# Patient Record
Sex: Female | Born: 1975 | Race: White | Hispanic: No | Marital: Married | Smoking: Never smoker
Health system: Southern US, Community
[De-identification: ages and names within clinical notes are randomized; demographics above are authoritative.]

## PROBLEM LIST (undated history)

## (undated) DIAGNOSIS — K219 Gastro-esophageal reflux disease without esophagitis: Secondary | ICD-10-CM

## (undated) DIAGNOSIS — R55 Syncope and collapse: Secondary | ICD-10-CM

## (undated) DIAGNOSIS — Q453 Other congenital malformations of pancreas and pancreatic duct: Secondary | ICD-10-CM

## (undated) DIAGNOSIS — D649 Anemia, unspecified: Secondary | ICD-10-CM

## (undated) DIAGNOSIS — I10 Essential (primary) hypertension: Secondary | ICD-10-CM

## (undated) DIAGNOSIS — R519 Headache, unspecified: Secondary | ICD-10-CM

## (undated) DIAGNOSIS — I471 Supraventricular tachycardia, unspecified: Secondary | ICD-10-CM

## (undated) DIAGNOSIS — N809 Endometriosis, unspecified: Secondary | ICD-10-CM

## (undated) DIAGNOSIS — A770 Spotted fever due to Rickettsia rickettsii: Secondary | ICD-10-CM

## (undated) DIAGNOSIS — F419 Anxiety disorder, unspecified: Secondary | ICD-10-CM

## (undated) DIAGNOSIS — N83209 Unspecified ovarian cyst, unspecified side: Secondary | ICD-10-CM

## (undated) DIAGNOSIS — M797 Fibromyalgia: Secondary | ICD-10-CM

## (undated) DIAGNOSIS — G4730 Sleep apnea, unspecified: Secondary | ICD-10-CM

## (undated) DIAGNOSIS — R51 Headache: Secondary | ICD-10-CM

## (undated) DIAGNOSIS — M069 Rheumatoid arthritis, unspecified: Secondary | ICD-10-CM

## (undated) HISTORY — DX: Headache: R51

## (undated) HISTORY — DX: Headache, unspecified: R51.9

## (undated) HISTORY — PX: LAPAROSCOPIC GASTRIC SLEEVE RESECTION: SHX5895

## (undated) HISTORY — DX: Other congenital malformations of pancreas and pancreatic duct: Q45.3

## (undated) HISTORY — DX: Essential (primary) hypertension: I10

## (undated) HISTORY — DX: Unspecified ovarian cyst, unspecified side: N83.209

## (undated) HISTORY — DX: Endometriosis, unspecified: N80.9

## (undated) HISTORY — DX: Supraventricular tachycardia, unspecified: I47.10

## (undated) HISTORY — PX: MOUTH SURGERY: SHX715

## (undated) HISTORY — DX: Supraventricular tachycardia: I47.1

## (undated) HISTORY — DX: Spotted fever due to Rickettsia rickettsii: A77.0

## (undated) HISTORY — PX: ENDOMETRIAL ABLATION: SHX621

## (undated) HISTORY — DX: Syncope and collapse: R55

## (undated) HISTORY — DX: Rheumatoid arthritis, unspecified: M06.9

---
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
## 1999-03-14 HISTORY — PX: TUBAL LIGATION: SHX77

## 2001-03-13 HISTORY — PX: REDUCTION MAMMAPLASTY: SUR839

## 2007-04-08 ENCOUNTER — Emergency Department (HOSPITAL_COMMUNITY): Admission: EM | Admit: 2007-04-08 | Discharge: 2007-04-08 | Payer: Self-pay | Admitting: Emergency Medicine

## 2010-12-25 LAB — HM PAP SMEAR: HM Pap smear: NORMAL

## 2011-01-25 ENCOUNTER — Other Ambulatory Visit: Payer: Self-pay | Admitting: *Deleted

## 2011-01-25 DIAGNOSIS — N63 Unspecified lump in unspecified breast: Secondary | ICD-10-CM

## 2011-02-08 ENCOUNTER — Ambulatory Visit
Admission: RE | Admit: 2011-02-08 | Discharge: 2011-02-08 | Disposition: A | Discharge status: Home / Self Care | Payer: 59 | Source: Ambulatory Visit | Attending: *Deleted | Admitting: *Deleted

## 2011-02-08 DIAGNOSIS — N63 Unspecified lump in unspecified breast: Secondary | ICD-10-CM

## 2011-02-16 ENCOUNTER — Other Ambulatory Visit: Payer: Self-pay | Admitting: *Deleted

## 2011-02-16 DIAGNOSIS — N63 Unspecified lump in unspecified breast: Secondary | ICD-10-CM

## 2011-07-31 ENCOUNTER — Ambulatory Visit
Admission: RE | Admit: 2011-07-31 | Discharge: 2011-07-31 | Disposition: A | Discharge status: Home / Self Care | Payer: 59 | Source: Ambulatory Visit | Attending: *Deleted | Admitting: *Deleted

## 2011-07-31 ENCOUNTER — Other Ambulatory Visit: Payer: Self-pay | Admitting: *Deleted

## 2011-07-31 DIAGNOSIS — N63 Unspecified lump in unspecified breast: Secondary | ICD-10-CM

## 2013-06-09 ENCOUNTER — Ambulatory Visit: Payer: Self-pay | Admitting: General Practice

## 2013-07-31 ENCOUNTER — Other Ambulatory Visit (HOSPITAL_COMMUNITY): Payer: Self-pay | Admitting: Psychiatry

## 2013-07-31 DIAGNOSIS — G43909 Migraine, unspecified, not intractable, without status migrainosus: Secondary | ICD-10-CM

## 2013-07-31 DIAGNOSIS — R413 Other amnesia: Secondary | ICD-10-CM

## 2013-07-31 DIAGNOSIS — R55 Syncope and collapse: Secondary | ICD-10-CM

## 2013-08-12 ENCOUNTER — Ambulatory Visit (INDEPENDENT_AMBULATORY_CARE_PROVIDER_SITE_OTHER): Payer: 59 | Admitting: Cardiovascular Disease

## 2013-08-12 ENCOUNTER — Encounter: Payer: Self-pay | Admitting: Cardiovascular Disease

## 2013-08-12 ENCOUNTER — Ambulatory Visit (HOSPITAL_COMMUNITY)
Admission: RE | Admit: 2013-08-12 | Discharge: 2013-08-12 | Disposition: A | Discharge status: Home / Self Care | Payer: 59 | Source: Ambulatory Visit | Attending: Psychiatry | Admitting: Psychiatry

## 2013-08-12 VITALS — BP 124/88 | HR 81 | Ht 63.0 in | Wt 204.5 lb

## 2013-08-12 DIAGNOSIS — G43909 Migraine, unspecified, not intractable, without status migrainosus: Secondary | ICD-10-CM

## 2013-08-12 DIAGNOSIS — R55 Syncope and collapse: Secondary | ICD-10-CM

## 2013-08-12 DIAGNOSIS — R413 Other amnesia: Secondary | ICD-10-CM

## 2013-08-12 DIAGNOSIS — E669 Obesity, unspecified: Secondary | ICD-10-CM | POA: Insufficient documentation

## 2013-08-12 DIAGNOSIS — I10 Essential (primary) hypertension: Secondary | ICD-10-CM

## 2013-08-12 DIAGNOSIS — R000 Tachycardia, unspecified: Secondary | ICD-10-CM | POA: Insufficient documentation

## 2013-08-12 NOTE — Progress Notes (Signed)
HPI  This is a pleasant 38 year old female who is here for evaluation of tachycardia. 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 She reports having a syncopal episode in 2012 which was followed by vomiting. She was evaluated by neurology with negative workup. She then saw cardiologist in [place] (Dr. Alroy Dust). She was told about tachycardia related to prednisone use (for rheumatoid arthritis). She was treated with metoprolol with improvement in symptoms. Thyroid function was unremarkable. 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  No chest pain or shortness of breath. Family history is remarkable for coronary artery disease. Her mother had coronary artery disease in her early 42s. The patient is considering weight loss surgery.   Allergies  Allergen Reactions  . Amoxicillin   . Enbrel [Etanercept]      No current outpatient prescriptions on file prior to visit.   No current facility-administered medications on file prior to visit.     Past Medical History  Diagnosis Date  . Syncope and collapse   . Heart murmur   . SVT (supraventricular tachycardia)   . Hypertension      Past Surgical History  Procedure Laterality Date  . Appendectomy    . Breast reduction surgery    . Tubal ligation       Family History  Problem Relation Age of Onset  . Heart disease Mother   . Heart attack Mother   . Stroke Mother   . Hypertension Mother   . Hyperlipidemia Mother   . Heart disease Father   . Heart attack Father   . Hypertension Father   . Hyperlipidemia Father      History   Social History  . Marital Status: Married    Spouse Name: N/A    Number of Children: N/A  . Years of Education: N/A   Occupational History  . Not on file.   Social History Main Topics  . Smoking status: Never Smoker   . Smokeless tobacco: Not on file  . Alcohol Use: No  . Drug Use: No  . Sexual Activity: Not  on file   Other Topics Concern  . Not on file   Social History Narrative  . No narrative on file     ROS A 10 point review of system was performed. It is negative other than that mentioned in the history of present illness.   PHYSICAL EXAM   BP 124/88  Pulse 81  Ht 5\' 3"  (1.6 m)  Wt 204 lb 8 oz (92.761 kg)  BMI 36.23 kg/m2 Constitutional: She is oriented to person, place, and time. She appears well-developed and well-nourished. No distress.  HENT: No nasal discharge.  Head: Normocephalic and atraumatic.  Eyes: Pupils are equal and round. No discharge.  Neck: Normal range of motion. Neck supple. No JVD present. No thyromegaly present.  Cardiovascular: Normal rate, regular rhythm, normal heart sounds. Exam reveals no gallop and no friction rub. No murmur heard.  Pulmonary/Chest: Effort normal and breath sounds normal. No stridor. No respiratory distress. She has no wheezes. She has no rales. She exhibits no tenderness.  Abdominal: Soft. Bowel sounds are normal. She exhibits no distension. There is no tenderness. There is no rebound and no guarding.  Musculoskeletal: Normal range of motion. She exhibits no edema and no tenderness.  Neurological: She is alert and oriented to person, place, and time. Coordination normal.  Skin: Skin is warm and dry. No rash noted. She is  not diaphoretic. No erythema. No pallor.  Psychiatric: She has a normal mood and affect. Her behavior is normal. Judgment and thought content normal.     JQB:HALPFX sinus rhythm with a PVC. No significant ST or T wave changes.   ASSESSMENT AND PLAN

## 2013-08-12 NOTE — Patient Instructions (Signed)
Continue same medications.   Your physician wants you to follow-up in: 6 months.  You will receive a reminder letter in the mail two months in advance. If you don't receive a letter, please call our office to schedule the follow-up appointment.  

## 2013-08-12 NOTE — Assessment & Plan Note (Signed)
Blood pressure is controlled on metoprolol and hydrochlorothiazide.

## 2013-08-12 NOTE — Assessment & Plan Note (Signed)
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 I requested a cardiac records from previous cardiologist before proceeding with any further testing. Symptoms seem to be reasonably controlled with metoprolol.

## 2013-08-12 NOTE — Assessment & Plan Note (Signed)
I don't see a contraindication for weight loss surgery from a cardiac standpoint.

## 2013-08-14 ENCOUNTER — Ambulatory Visit (INDEPENDENT_AMBULATORY_CARE_PROVIDER_SITE_OTHER): Payer: 59 | Admitting: Adult Health

## 2013-08-14 ENCOUNTER — Encounter: Payer: Self-pay | Admitting: Adult Health

## 2013-08-14 VITALS — BP 110/70 | HR 73 | Resp 16 | Ht 64.0 in | Wt 205.8 lb

## 2013-08-14 DIAGNOSIS — E669 Obesity, unspecified: Secondary | ICD-10-CM

## 2013-08-14 NOTE — Progress Notes (Signed)
Pre visit review using our clinic review tool, if applicable. No additional management support is needed unless otherwise documented below in the visit note. 

## 2013-08-14 NOTE — Patient Instructions (Signed)
   Thank you for choosing [facility] for your health care needs.  Please schedule your complete physical exam at your convenience. You will need to be fasting.

## 2013-08-14 NOTE — Progress Notes (Signed)
Subjective:    Patient ID: JOSEPH JOHNS, female    DOB: 10-23-1975, 38 y.o.   MRN: [id number]  HPI Reports feeling well. Considering bariatric surgery, consulting with bariatric surgery for potential gastric sleeve. Having a hard time managing weight and wants to reduce risk of cardiovascular disease with family history as well as other illnesses. Has lost weight in the past. Has tired [place] in the past. Currently exercises using the fitbit and walking. Concerned with daughter becoming healthy and setting a good example.    Past Medical History  Diagnosis Date  . Syncope and collapse 3 years ago - Happened about 2 times - Last time     Seen by neuro and cardiology - increased heart rate related  . Heart murmur     Seen by Dr. Fletcher Anon - no murmur  . SVT (supraventricular tachycardia) Noticed 3 years ago    Followed by Dr. Fletcher Anon  . Hypertension 3 years ago  . Frequent headaches     Followed by Neurology - in [place] - stress  . Rheumatoid arthritis From [facility]    Remission 2 years  . Rocky Mountain spotted fever 5 years ago     Past Surgical History  Procedure Laterality Date  . Appendectomy  1984  . Breast reduction surgery  2003  . Tubal ligation  2001  . Endometrial ablation  2010? Pt unsure     Family History  Problem Relation Age of Onset  . Heart disease Mother   . Heart attack Mother   . Stroke Mother   . Hypertension Mother   . Hyperlipidemia Mother   . Arthritis Mother     Rheumatoid arthritis  . Kidney disease Mother   . Depression Mother   . Diabetes Mother   . Heart disease Father   . Heart attack Father   . Hypertension Father   . Hyperlipidemia Father   . Kidney disease Father   . Diabetes Father   . COPD Father   . Cancer Maternal Grandmother     Breast cancer  . Heart attack Maternal Grandmother   . Arthritis Maternal Grandfather     rheumatoid arthritis  . Diabetes Maternal Grandfather      History   Social History  . Marital Status:  Married    Spouse Name: Donnie    Number of Children: 3  . Years of Education: 16   Occupational History  . Respiratory Therapist    Social History Main Topics  . Smoking status: Never Smoker   . Smokeless tobacco: Never Used  . Alcohol Use: No  . Drug Use: No  . Sexual Activity: Yes    Birth Control/ Protection: Other-see comments     Comment: Ablation   Other Topics Concern  . Not on file   Social History Narrative   Born in raised in [place], [place]. Currently resides in [place], [place]. Lives with husband, [place], and 3 children Ysidro Evert 18, Emily 16, Seth 13). 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 Enjoys camping, shopping, reading, and involved in church. Likes to ride motorcycle with husband and wears helmet.      Current Outpatient Prescriptions on File Prior to Visit  Medication Sig Dispense Refill  . ALPRAZolam (XANAX) 0.25 MG tablet Take 0.25 mg by mouth at bedtime as needed for anxiety.      Marland Kitchen BIOTIN 5000 PO Take by mouth daily.      . Calcium Carbonate-Vit D-Min (CALTRATE PLUS PO) Take by  mouth daily.      Marland Kitchen gabapentin (NEURONTIN) 300 MG capsule Take 300 mg by mouth at bedtime.      . hydrochlorothiazide (HYDRODIURIL) 25 MG tablet Take 25 mg by mouth daily.      . metoprolol (LOPRESSOR) 50 MG tablet Take 50 mg by mouth 2 (two) times daily.      . Multiple Vitamin (MULTIVITAMIN) tablet Take 1 tablet by mouth daily.       No current facility-administered medications on file prior to visit.     Review of Systems  Constitutional:       Concerned about her weight. Considering bariatric surgery  HENT: Negative.   Eyes: Negative.   Respiratory: Negative.   Cardiovascular: Negative.   Gastrointestinal: Negative.   Endocrine: Negative.   Genitourinary: Negative.   Musculoskeletal: Negative.   Skin: Negative.   Allergic/Immunologic: Negative.   Neurological: Negative.   Hematological: Negative.   Psychiatric/Behavioral: Negative.          Objective:  BP 110/70  Pulse 73  Resp 16  Ht 5\' 4"  (1.626 m)  Wt 205 lb 12 oz (93.328 kg)  BMI 35.30 kg/m2  SpO2 97%   Physical Exam  Constitutional: She is oriented to person, place, and time. No distress.  HENT:  Head: Normocephalic and atraumatic.  Eyes: Conjunctivae and EOM are normal.  Neck: Normal range of motion. Neck supple.  Cardiovascular: Normal rate, regular rhythm, normal heart sounds and intact distal pulses.  Exam reveals no gallop and no friction rub.   No murmur heard. Pulmonary/Chest: Effort normal and breath sounds normal. No respiratory distress. She has no wheezes. She has no rales.  Musculoskeletal: Normal range of motion.  Neurological: She is alert and oriented to person, place, and time. She has normal reflexes. Coordination normal.  Skin: Skin is warm and dry.  Psychiatric: She has a normal mood and affect. Her behavior is normal. Judgment and thought content normal.       Assessment & Plan:   1. Obesity Problems with her weight for a very long time. She has successfully lost weight in the past but then gained it back. Discussed importance of developing new habits. Loss of weight best done slowly and consistently. We discussed several ways including a program such as weight watchers. Recommend increasing physical activity. She really wants bariatric surgery to be last option and she wants to give weight loss one more change prior to deciding on surgery. We discussed that all surgeries have a risk. I do agree that she should leave surgery as a last resort. She is planning on joining weight watches. Follow up in 1 month. She is also need to have her complete physical exam.

## 2013-08-17 ENCOUNTER — Encounter: Payer: Self-pay | Admitting: Adult Health

## 2013-08-25 ENCOUNTER — Ambulatory Visit: Payer: Self-pay | Admitting: Specialist

## 2013-08-25 LAB — COMPREHENSIVE METABOLIC PANEL
ALT: 20 U/L (ref 12–78)
ANION GAP: 5 — AB (ref 7–16)
Albumin: 3.6 g/dL (ref 3.4–5.0)
Alkaline Phosphatase: 54 U/L
BILIRUBIN TOTAL: 0.5 mg/dL (ref 0.2–1.0)
BUN: 18 mg/dL (ref 7–18)
CREATININE: 0.95 mg/dL (ref 0.60–1.30)
Calcium, Total: 8.5 mg/dL (ref 8.5–10.1)
Chloride: 108 mmol/L — ABNORMAL HIGH (ref 98–107)
Co2: 27 mmol/L (ref 21–32)
EGFR (African American): >60
EGFR (Non-African Amer.): >60
GLUCOSE: 88 mg/dL (ref 65–99)
OSMOLALITY: 281 (ref 275–301)
Potassium: 3.5 mmol/L (ref 3.5–5.1)
SGOT(AST): 25 U/L (ref 15–37)
Sodium: 140 mmol/L (ref 136–145)
TOTAL PROTEIN: 7 g/dL (ref 6.4–8.2)

## 2013-08-25 LAB — CBC WITH DIFFERENTIAL/PLATELET
Basophil #: 0.1 10*3/uL (ref 0.0–0.1)
Basophil %: 0.6 %
EOS ABS: 0.1 10*3/uL (ref 0.0–0.7)
EOS PCT: 1.7 %
HCT: 35.4 % (ref 35.0–47.0)
HGB: 12.2 g/dL (ref 12.0–16.0)
Lymphocyte #: 2.9 10*3/uL (ref 1.0–3.6)
Lymphocyte %: 33.1 %
MCH: 30.1 pg (ref 26.0–34.0)
MCHC: 34.4 g/dL (ref 32.0–36.0)
MCV: 87 fL (ref 80–100)
Monocyte #: 0.4 x10 3/mm (ref 0.2–0.9)
Monocyte %: 4.7 %
Neutrophil #: 5.3 10*3/uL (ref 1.4–6.5)
Neutrophil %: 59.9 %
Platelet: 240 10*3/uL (ref 150–440)
RBC: 4.06 10*6/uL (ref 3.80–5.20)
RDW: 13.2 % (ref 11.5–14.5)
WBC: 8.8 10*3/uL (ref 3.6–11.0)

## 2013-08-25 LAB — CBC AND DIFFERENTIAL
Hemoglobin: 12.2 g/dL (ref 12.0–16.0)
Platelets: 240 10*3/uL (ref 150–399)
WBC: 8.8 10^3/mL

## 2013-08-25 LAB — PROTIME-INR
INR: 1.1
PROTHROMBIN TIME: 13.9 s (ref 11.5–14.7)

## 2013-08-25 LAB — TSH
TSH: 2.33 u[IU]/mL (ref <=5.90)
Thyroid Stimulating Horm: 2.33 u[IU]/mL

## 2013-08-25 LAB — AMYLASE: Amylase: 55 U/L (ref 25–115)

## 2013-08-25 LAB — HEMOGLOBIN A1C
Hemoglobin A1C: 5.7 % (ref 4.2–6.3)
Hgb A1c MFr Bld: 5.7 % (ref 4.0–6.0)

## 2013-08-25 LAB — IRON AND TIBC
Iron Bind.Cap.(Total): 365 ug/dL (ref 250–450)
Iron Saturation: 30 %
Iron: 109 ug/dL (ref 50–170)
UNBOUND IRON-BIND. CAP.: 256 ug/dL

## 2013-08-25 LAB — LIPASE, BLOOD: Lipase: 208 U/L (ref 73–393)

## 2013-08-25 LAB — BASIC METABOLIC PANEL
BUN: 18 mg/dL (ref 4–21)
CREATININE: 1 mg/dL (ref <=1.1)
Glucose: 88 mg/dL
POTASSIUM: 3.5 mmol/L (ref 3.4–5.3)
SODIUM: 140 mmol/L (ref 137–147)

## 2013-08-25 LAB — HEPATIC FUNCTION PANEL
ALK PHOS: 54 U/L (ref 25–125)
ALT: 20 U/L (ref 7–35)
AST: 25 U/L (ref 13–35)

## 2013-08-25 LAB — BILIRUBIN, DIRECT: Bilirubin, Direct: <0.1 mg/dL (ref 0.00–0.20)

## 2013-08-25 LAB — FOLATE: Folic Acid: 15.7 ng/mL (ref 3.1–100.0)

## 2013-08-25 LAB — MAGNESIUM: Magnesium: 1.9 mg/dL

## 2013-08-25 LAB — APTT: Activated PTT: 28.2 secs (ref 23.6–35.9)

## 2013-08-25 LAB — FERRITIN: FERRITIN (ARMC): 41 ng/mL (ref 8–388)

## 2013-08-25 LAB — PHOSPHORUS: PHOSPHORUS: 2.9 mg/dL (ref 2.5–4.9)

## 2013-09-02 ENCOUNTER — Other Ambulatory Visit: Payer: Self-pay

## 2013-09-02 ENCOUNTER — Ambulatory Visit: Payer: Self-pay | Admitting: Specialist

## 2013-09-02 MED ORDER — HYDROCHLOROTHIAZIDE 25 MG PO TABS: 25.0000 mg | ORAL_TABLET | Freq: Every day | ORAL | Status: DC

## 2013-09-10 ENCOUNTER — Ambulatory Visit: Payer: Self-pay | Admitting: Specialist

## 2013-09-16 ENCOUNTER — Ambulatory Visit: Payer: Self-pay | Admitting: Specialist

## 2013-09-17 ENCOUNTER — Telehealth: Payer: Self-pay

## 2013-09-17 NOTE — Telephone Encounter (Signed)
Pt called requesting cardiac clearance for bariatric surgery. Please call.

## 2013-09-17 NOTE — Telephone Encounter (Signed)
Spoke with patient  She stated that she would go the the cardiology office ans pick up records  She stated that she is not sure she will have the surgery yet  I instructed to call me when she sends the records  Patient verbalized understanding

## 2013-09-17 NOTE — Telephone Encounter (Signed)
I still don't have her previous cardiac records. I need the records in order to see if any testing is needed.

## 2013-09-18 ENCOUNTER — Other Ambulatory Visit: Payer: Self-pay | Admitting: *Deleted

## 2013-09-18 MED ORDER — METOPROLOL TARTRATE 50 MG PO TABS: 50.0000 mg | ORAL_TABLET | Freq: Two times a day (BID) | ORAL | Status: DC

## 2013-09-23 ENCOUNTER — Ambulatory Visit (INDEPENDENT_AMBULATORY_CARE_PROVIDER_SITE_OTHER): Payer: 59 | Admitting: Adult Health

## 2013-09-23 ENCOUNTER — Encounter: Payer: Self-pay | Admitting: Adult Health

## 2013-09-23 VITALS — BP 110/70 | HR 72 | Temp 98.1°F | Resp 14 | Ht 64.0 in | Wt 206.8 lb

## 2013-09-23 DIAGNOSIS — Z0000 Encounter for general adult medical examination without abnormal findings: Secondary | ICD-10-CM

## 2013-09-23 LAB — LIPID PANEL
CHOL/HDL RATIO: 2
Cholesterol: 102 mg/dL (ref 0–200)
HDL: 41.6 mg/dL (ref >39.00)
LDL CALC: 35 mg/dL (ref 0–99)
NonHDL: 60.4
TRIGLYCERIDES: 125 mg/dL (ref 0.0–149.0)
VLDL: 25 mg/dL (ref 0.0–40.0)

## 2013-09-23 LAB — VITAMIN D 25 HYDROXY (VIT D DEFICIENCY, FRACTURES): VITD: 33.35 ng/mL

## 2013-09-23 NOTE — Patient Instructions (Signed)
  You had your annual physical exam today.  Please have your labs drawn prior to leaving the office today.  We will notify you of results through MyChart once they are available.

## 2013-09-23 NOTE — Progress Notes (Signed)
Patient ID: Cathy Guerrero, female   DOB: Mar 04, 1976, 38 y.o.   MRN: [id number]   Subjective:    Patient ID: Cathy Guerrero, female    DOB: 13-Sep-1975, 38 y.o.   MRN: [id number]  HPI  Pt is a 38 y/o female who presents to clinic for her annual physical exam. She is followed by GYN in [place], [place]. UTD on her PAP which have been normal. She had a full screen for skin cancer in May 2015 by Dermatology. Recent appt with Dr. Darnell Level to discuss options regarding bariatric surgery. She remains uncertain about this procedure. Currently being treated for H pylori. Sleep study done last Tuesday showing possible sleep apnea.   Past Medical History  Diagnosis Date  . Syncope and collapse 3 years ago - Happened about 2 times - Last time     Seen by neuro and cardiology - increased heart rate related  . Heart murmur     Seen by Dr. Fletcher Anon - no murmur  . SVT (supraventricular tachycardia) Noticed 3 years ago    Followed by Dr. Fletcher Anon  . Hypertension 3 years ago  . Frequent headaches     Followed by Neurology - in [place] - stress  . Rheumatoid arthritis From [facility]    Remission 2 years  . Rocky Mountain spotted fever 5 years ago     Past Surgical History  Procedure Laterality Date  . Appendectomy  1984  . Breast reduction surgery  2003  . Tubal ligation  2001  . Endometrial ablation  2010? Pt unsure     Family History  Problem Relation Age of Onset  . Heart disease Mother   . Heart attack Mother   . Stroke Mother   . Hypertension Mother   . Hyperlipidemia Mother   . Arthritis Mother     Rheumatoid arthritis  . Kidney disease Mother   . Depression Mother   . Diabetes Mother   . Heart disease Father   . Heart attack Father   . Hypertension Father   . Hyperlipidemia Father   . Kidney disease Father   . Diabetes Father   . COPD Father   . Cancer Maternal Grandmother     Breast cancer  . Heart attack Maternal Grandmother   . Arthritis Maternal Grandfather     rheumatoid arthritis    . Diabetes Maternal Grandfather      History   Social History  . Marital Status: Married    Spouse Name: Donnie    Number of Children: 3  . Years of Education: 16   Occupational History  . Respiratory Therapist    Social History Main Topics  . Smoking status: Never Smoker   . Smokeless tobacco: Never Used  . Alcohol Use: No  . Drug Use: No  . Sexual Activity: Yes    Birth Control/ Protection: Other-see comments     Comment: Ablation   Other Topics Concern  . Not on file   Social History Narrative   Born in raised in [place], [place]. Currently resides in [place], [place]. Lives with husband, [place], and 3 children Ysidro Evert 18, Emily 16, Seth 13). 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 Enjoys camping, shopping, reading, and involved in church. Likes to ride motorcycle with husband and wears helmet.      Current Outpatient Prescriptions on File Prior to Visit  Medication Sig Dispense Refill  . ALPRAZolam (XANAX) 0.25 MG tablet Take 0.25 mg by mouth at bedtime as needed  for anxiety.      Marland Kitchen BIOTIN 5000 PO Take by mouth daily.      . Calcium Carbonate-Vit D-Min (CALTRATE PLUS PO) Take by mouth daily.      Marland Kitchen gabapentin (NEURONTIN) 300 MG capsule Take 300 mg by mouth at bedtime.      . hydrochlorothiazide (HYDRODIURIL) 25 MG tablet Take 1 tablet (25 mg total) by mouth daily.  30 tablet  3  . metoprolol (LOPRESSOR) 50 MG tablet Take 1 tablet (50 mg total) by mouth 2 (two) times daily.  60 tablet  3  . Multiple Vitamin (MULTIVITAMIN) tablet Take 1 tablet by mouth daily.       No current facility-administered medications on file prior to visit.     Review of Systems  Constitutional: Negative.   HENT: Negative.   Eyes: Negative.   Respiratory: Negative.   Cardiovascular: Negative.   Gastrointestinal: Negative.   Endocrine: Negative.   Genitourinary: Negative.   Musculoskeletal: Negative.   Skin: Negative.   Allergic/Immunologic: Negative.    Neurological: Negative.   Hematological: Negative.   Psychiatric/Behavioral: Negative.        Objective:  BP 110/70  Pulse 72  Temp(Src) 98.1 F (36.7 C) (Oral)  Resp 14  Ht 5\' 4"  (1.626 m)  Wt 206 lb 12 oz (93.781 kg)  BMI 35.47 kg/m2  SpO2 98%    Physical Exam  Constitutional: She is oriented to person, place, and time. She appears well-developed and well-nourished. No distress.  HENT:  Head: Normocephalic and atraumatic.  Right Ear: External ear normal.  Left Ear: External ear normal.  Nose: Nose normal.  Mouth/Throat: Oropharynx is clear and moist.  Eyes: Conjunctivae and EOM are normal. Pupils are equal, round, and reactive to light.  Neck: Normal range of motion. Neck supple. No tracheal deviation present. No thyromegaly present.  Cardiovascular: Normal rate, regular rhythm, normal heart sounds and intact distal pulses.  Exam reveals no gallop and no friction rub.   No murmur heard. Pulmonary/Chest: Effort normal and breath sounds normal. No respiratory distress. She has no wheezes. She has no rales.  Abdominal: Soft. Bowel sounds are normal. She exhibits no distension and no mass. There is no tenderness. There is no rebound and no guarding.  Musculoskeletal: Normal range of motion. She exhibits no edema and no tenderness.  Lymphadenopathy:    She has no cervical adenopathy.  Neurological: She is alert and oriented to person, place, and time. She has normal reflexes. No cranial nerve deficit. Coordination normal.  Skin: Skin is warm and dry.  Psychiatric: She has a normal mood and affect. Her behavior is normal. Judgment and thought content normal.      Assessment & Plan:   1. Routine general medical examination at a health care facility Normal physical exam. PAP deferred since she is followed by GYN and is UTD. Screenings addressed. Recent labs drawn at Dr. Synthia Innocent office. Copy added to be scanned. - Vit D  25 hydroxy (rtn osteoporosis monitoring) - Lipid  panel

## 2013-09-23 NOTE — Progress Notes (Signed)
Pre visit review using our clinic review tool, if applicable. No additional management support is needed unless otherwise documented below in the visit note. 

## 2013-10-07 ENCOUNTER — Ambulatory Visit: Payer: Self-pay | Admitting: Gastroenterology

## 2013-10-09 LAB — PATHOLOGY REPORT

## 2013-10-11 ENCOUNTER — Ambulatory Visit: Payer: Self-pay | Admitting: Specialist

## 2013-11-11 ENCOUNTER — Ambulatory Visit: Payer: Self-pay | Admitting: Specialist

## 2013-11-24 ENCOUNTER — Telehealth: Payer: Self-pay

## 2013-11-24 NOTE — Telephone Encounter (Signed)
Pt needs cardiac clearance for bariatric surgery. Please call and advise.

## 2013-11-25 ENCOUNTER — Encounter: Payer: Self-pay | Admitting: *Deleted

## 2013-11-25 NOTE — Telephone Encounter (Signed)
She is at low risk for bariatric surgery from a cardiac standpoint.

## 2013-11-25 NOTE — Telephone Encounter (Signed)
Cardiac clearance letter faxed to Dr. Darnell Level

## 2013-12-23 ENCOUNTER — Other Ambulatory Visit: Payer: Self-pay | Admitting: Cardiovascular Disease

## 2014-01-19 ENCOUNTER — Ambulatory Visit: Payer: Self-pay | Admitting: Specialist

## 2014-01-19 LAB — CBC WITH DIFFERENTIAL/PLATELET
Basophil #: 0.1 10*3/uL (ref 0.0–0.1)
Basophil %: 0.8 %
Eosinophil #: 0.1 10*3/uL (ref 0.0–0.7)
Eosinophil %: 1.4 %
HCT: 39.2 % (ref 35.0–47.0)
HGB: 13.3 g/dL (ref 12.0–16.0)
Lymphocyte #: 2.7 10*3/uL (ref 1.0–3.6)
Lymphocyte %: 38.5 %
MCH: 29.4 pg (ref 26.0–34.0)
MCHC: 33.8 g/dL (ref 32.0–36.0)
MCV: 87 fL (ref 80–100)
MONO ABS: 0.4 x10 3/mm (ref 0.2–0.9)
Monocyte %: 5.9 %
NEUTROS PCT: 53.4 %
Neutrophil #: 3.7 10*3/uL (ref 1.4–6.5)
Platelet: 275 10*3/uL (ref 150–440)
RBC: 4.52 10*6/uL (ref 3.80–5.20)
RDW: 13 % (ref 11.5–14.5)
WBC: 6.9 10*3/uL (ref 3.6–11.0)

## 2014-01-19 LAB — POTASSIUM: Potassium: 2.8 mmol/L — ABNORMAL LOW (ref 3.5–5.1)

## 2014-01-20 ENCOUNTER — Telehealth: Payer: Self-pay | Admitting: *Deleted

## 2014-01-20 MED ORDER — POTASSIUM CHLORIDE CRYS ER 20 MEQ PO TBCR: 20.0000 meq | EXTENDED_RELEASE_TABLET | Freq: Every day | ORAL | Status: DC

## 2014-01-20 NOTE — Telephone Encounter (Signed)
Informed patient potassium was ordered by Dr. Derry Skill office  Patient verbalized understanding

## 2014-01-20 NOTE — Telephone Encounter (Signed)
Muhammad, This pt was followed by [facility] and cleared by your office for bariatric surgery. Her potassium on preop labs was 2.8. I have never seen her. I reviewed her chart and I see that she is on HCTZ. Do you want to supplement her K before surgery? Or just stop the HCTZ? Thanks, Delsa Sale

## 2014-01-20 NOTE — Addendum Note (Signed)
Addended by: Johnsie Cancel on: 01/20/2014 02:06 PM   Modules accepted: Orders

## 2014-01-20 NOTE — Telephone Encounter (Signed)
BP is controlled so we should continue HCTZ. We can add K-dur 20 meq po once daily and repeat BMP in 1 week.

## 2014-01-20 NOTE — Telephone Encounter (Signed)
See below. Please start K-dur 46meQ daily  #30 with no refills. And repeat BMP  In 1 week

## 2014-01-20 NOTE — Telephone Encounter (Signed)
Results received, placed in Dr Thomes Dinning folder for review.  I called [facility] in case they received results on 11.9.15 since Dr Fletcher Anon did medical clearance on pt for surgery.  [facility] had not spoken to anyone either about the results.

## 2014-01-20 NOTE — Telephone Encounter (Signed)
Pt called states she spoke with someone yesterday afternoon in reference to her lab results being faxed and received.  Pt states she was told the lab results were given to MD for review, no message in the system as to who records given to.  I advised pt I would call Dr Synthia Innocent office to have records faxed again.  Pt states she needs treatment for low Potassium today or her surgery scheduled on Tuesday will be cancelled.  I spoke with Larena Glassman at Dr Synthia Innocent office.  She states she will refax labs now.  Once I receive fax I will place in Dr Thomes Dinning folder for review.

## 2014-01-26 ENCOUNTER — Ambulatory Visit: Payer: Self-pay | Admitting: Anesthesiology

## 2014-01-26 LAB — POTASSIUM: POTASSIUM: 3.4 mmol/L — AB (ref 3.5–5.1)

## 2014-01-27 ENCOUNTER — Inpatient Hospital Stay: Payer: Self-pay | Admitting: Specialist

## 2014-01-27 LAB — CREATININE, SERUM
Creatinine: 0.84 mg/dL (ref 0.60–1.30)
EGFR (Non-African Amer.): >60

## 2014-01-28 LAB — ALBUMIN: ALBUMIN: 3.2 g/dL — AB (ref 3.4–5.0)

## 2014-01-28 LAB — BASIC METABOLIC PANEL
ANION GAP: 8 (ref 7–16)
BUN: 5 mg/dL — ABNORMAL LOW (ref 7–18)
CALCIUM: 7.7 mg/dL — AB (ref 8.5–10.1)
Chloride: 109 mmol/L — ABNORMAL HIGH (ref 98–107)
Co2: 22 mmol/L (ref 21–32)
Creatinine: 0.78 mg/dL (ref 0.60–1.30)
GLUCOSE: 86 mg/dL (ref 65–99)
Osmolality: 274 (ref 275–301)
Potassium: 3.3 mmol/L — ABNORMAL LOW (ref 3.5–5.1)
Sodium: 139 mmol/L (ref 136–145)

## 2014-01-28 LAB — CBC WITH DIFFERENTIAL/PLATELET
Basophil #: 0 10*3/uL (ref 0.0–0.1)
Basophil %: 0.4 %
EOS ABS: 0 10*3/uL (ref 0.0–0.7)
Eosinophil %: 0.3 %
HCT: 35 % (ref 35.0–47.0)
HGB: 11.8 g/dL — ABNORMAL LOW (ref 12.0–16.0)
LYMPHS PCT: 21.3 %
Lymphocyte #: 1.7 10*3/uL (ref 1.0–3.6)
MCH: 29.7 pg (ref 26.0–34.0)
MCHC: 33.6 g/dL (ref 32.0–36.0)
MCV: 88 fL (ref 80–100)
Monocyte #: 0.5 x10 3/mm (ref 0.2–0.9)
Monocyte %: 6.2 %
Neutrophil #: 5.9 10*3/uL (ref 1.4–6.5)
Neutrophil %: 71.8 %
Platelet: 201 10*3/uL (ref 150–440)
RBC: 3.97 10*6/uL (ref 3.80–5.20)
RDW: 13.2 % (ref 11.5–14.5)
WBC: 8.2 10*3/uL (ref 3.6–11.0)

## 2014-01-28 LAB — MAGNESIUM: Magnesium: 1.5 mg/dL — ABNORMAL LOW

## 2014-01-28 LAB — PHOSPHORUS: PHOSPHORUS: 2.5 mg/dL (ref 2.5–4.9)

## 2014-02-19 ENCOUNTER — Other Ambulatory Visit: Payer: Self-pay | Admitting: Cardiovascular Disease

## 2014-02-23 ENCOUNTER — Ambulatory Visit: Payer: Self-pay | Admitting: Specialist

## 2014-03-13 ENCOUNTER — Ambulatory Visit: Payer: Self-pay | Admitting: Specialist

## 2014-03-16 ENCOUNTER — Other Ambulatory Visit: Payer: Self-pay

## 2014-03-16 DIAGNOSIS — Z1231 Encounter for screening mammogram for malignant neoplasm of breast: Secondary | ICD-10-CM

## 2014-03-25 ENCOUNTER — Other Ambulatory Visit: Payer: Self-pay

## 2014-03-25 ENCOUNTER — Ambulatory Visit
Admission: RE | Admit: 2014-03-25 | Discharge: 2014-03-25 | Disposition: A | Discharge status: Home / Self Care | Payer: 59 | Source: Ambulatory Visit

## 2014-03-25 DIAGNOSIS — Z1231 Encounter for screening mammogram for malignant neoplasm of breast: Secondary | ICD-10-CM

## 2014-04-01 ENCOUNTER — Other Ambulatory Visit: Payer: Self-pay | Admitting: Cardiovascular Disease

## 2014-04-01 ENCOUNTER — Ambulatory Visit: Payer: Self-pay

## 2014-04-01 LAB — COMPREHENSIVE METABOLIC PANEL
ALBUMIN: 3.6 g/dL (ref 3.4–5.0)
ALK PHOS: 61 U/L
ANION GAP: 4 — AB (ref 7–16)
BUN: 11 mg/dL (ref 7–18)
Bilirubin,Total: 0.5 mg/dL (ref 0.2–1.0)
CREATININE: 0.77 mg/dL (ref 0.60–1.30)
Calcium, Total: 9 mg/dL (ref 8.5–10.1)
Chloride: 103 mmol/L (ref 98–107)
Co2: 33 mmol/L — ABNORMAL HIGH (ref 21–32)
EGFR (African American): >60
EGFR (Non-African Amer.): >60
GLUCOSE: 105 mg/dL — AB (ref 65–99)
OSMOLALITY: 279 (ref 275–301)
POTASSIUM: 2.9 mmol/L — AB (ref 3.5–5.1)
SGOT(AST): 19 U/L (ref 15–37)
SGPT (ALT): 26 U/L
Sodium: 140 mmol/L (ref 136–145)
Total Protein: 7.1 g/dL (ref 6.4–8.2)

## 2014-04-06 ENCOUNTER — Other Ambulatory Visit: Payer: Self-pay

## 2014-04-06 MED ORDER — METOPROLOL TARTRATE 50 MG PO TABS: ORAL_TABLET | ORAL | Status: DC

## 2014-04-06 MED ORDER — HYDROCHLOROTHIAZIDE 25 MG PO TABS: ORAL_TABLET | ORAL | Status: DC

## 2014-04-06 NOTE — Telephone Encounter (Signed)
Pt has appt scheduled for 04-21-2014

## 2014-04-27 ENCOUNTER — Ambulatory Visit: Payer: 59 | Admitting: Cardiovascular Disease

## 2014-05-18 ENCOUNTER — Ambulatory Visit: Payer: 59 | Admitting: Cardiovascular Disease

## 2014-05-29 ENCOUNTER — Ambulatory Visit: Payer: 59 | Admitting: Cardiovascular Disease

## 2014-06-01 ENCOUNTER — Other Ambulatory Visit: Admit: 2014-06-01 | Disposition: A | Discharge status: Home / Self Care | Payer: Self-pay

## 2014-06-01 ENCOUNTER — Ambulatory Visit (INDEPENDENT_AMBULATORY_CARE_PROVIDER_SITE_OTHER): Payer: 59 | Admitting: Cardiovascular Disease

## 2014-06-01 ENCOUNTER — Encounter: Payer: Self-pay | Admitting: Cardiovascular Disease

## 2014-06-01 VITALS — BP 92/62 | HR 63 | Ht 64.0 in | Wt 161.2 lb

## 2014-06-01 DIAGNOSIS — R000 Tachycardia, unspecified: Secondary | ICD-10-CM

## 2014-06-01 DIAGNOSIS — I159 Secondary hypertension, unspecified: Secondary | ICD-10-CM

## 2014-06-01 DIAGNOSIS — E669 Obesity, unspecified: Secondary | ICD-10-CM

## 2014-06-01 MED ORDER — METOPROLOL TARTRATE 25 MG PO TABS: 25.0000 mg | ORAL_TABLET | Freq: Two times a day (BID) | ORAL | Status: DC

## 2014-06-01 NOTE — Assessment & Plan Note (Signed)
Blood pressure has been low likely due to weight loss. I discontinued hydrochlorothiazide.

## 2014-06-01 NOTE — Assessment & Plan Note (Signed)
She has been taking metoprolol 50 mg once daily. I change the dose to 25 mg twice daily given that this is the short acting. Palpitations are well controlled.

## 2014-06-01 NOTE — Assessment & Plan Note (Signed)
She is doing very well after gastric sleeve surgery. We discussed starting an exercise program.

## 2014-06-01 NOTE — Progress Notes (Signed)
HPI  This is a pleasant 39 year old female who is here for a follow up visit regarding tachycardia. 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 She reports having a syncopal episode in 2012 which was followed by vomiting. She was evaluated by neurology with negative workup. She then saw cardiologist in [place] (Dr. Alroy Dust). She was told about tachycardia related to prednisone use (for rheumatoid arthritis). She was treated with metoprolol with improvement in symptoms. Thyroid function was unremarkable. 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   Family history is remarkable for coronary artery disease. Her mother had coronary artery disease in her early 32s. She underwent gastric sleeve surgery in November and lost 50 pounds since then. She is feeling significantly better with improvement in exertional dyspnea. No palpitations.  Allergies  Allergen Reactions  . Amoxicillin   . Enbrel [Etanercept]      Current Outpatient Prescriptions on File Prior to Visit  Medication Sig Dispense Refill  . ALPRAZolam (XANAX) 0.25 MG tablet Take 0.25 mg by mouth at bedtime as needed for anxiety.    Marland Kitchen BIOTIN 5000 PO Take by mouth daily.    Marland Kitchen gabapentin (NEURONTIN) 300 MG capsule Take 300 mg by mouth at bedtime.    . Multiple Vitamin (MULTIVITAMIN) tablet Take 1 tablet by mouth daily.     No current facility-administered medications on file prior to visit.     Past Medical History  Diagnosis Date  . Syncope and collapse 3 years ago - Happened about 2 times - Last time     Seen by neuro and cardiology - increased heart rate related  . Heart murmur     Seen by Dr. Fletcher Anon - no murmur  . SVT (supraventricular tachycardia) Noticed 3 years ago    Followed by Dr. Fletcher Anon  . Hypertension 3 years ago  . Frequent headaches     Followed by Neurology - in [place] - stress  . Rheumatoid arthritis From [facility]    Remission 2 years    . [place] spotted fever 5 years ago     Past Surgical History  Procedure Laterality Date  . Appendectomy  1984  . Breast reduction surgery  2003  . Tubal ligation  2001  . Endometrial ablation  2010? Pt unsure  . Laparoscopic gastric sleeve resection       Family History  Problem Relation Age of Onset  . Heart disease Mother   . Heart attack Mother   . Stroke Mother   . Hypertension Mother   . Hyperlipidemia Mother   . Arthritis Mother     Rheumatoid arthritis  . Kidney disease Mother   . Depression Mother   . Diabetes Mother   . Heart disease Father   . Heart attack Father   . Hypertension Father   . Hyperlipidemia Father   . Kidney disease Father   . Diabetes Father   . COPD Father   . Cancer Maternal Grandmother     Breast cancer  . Heart attack Maternal Grandmother   . Arthritis Maternal Grandfather     rheumatoid arthritis  . Diabetes Maternal Grandfather      History   Social History  . Marital Status: Married    Spouse Name: Donnie  . Number of Children: 3  . Years of Education: 16   Occupational History  . Respiratory Therapist    Social History Main Topics  . Smoking status: Never Smoker   .  Smokeless tobacco: Never Used  . Alcohol Use: No  . Drug Use: No  . Sexual Activity: Yes    Birth Control/ Protection: Other-see comments     Comment: Ablation   Other Topics Concern  . Not on file   Social History Narrative   Born in raised in [place], [place]. Currently resides in [place], [place]. Lives with husband, Emerald, and 3 children Ysidro Evert 18, Emily 16, Seth 13). 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 Enjoys camping, shopping, reading, and involved in church. Likes to ride motorcycle with husband and wears helmet.      ROS A 10 point review of system was performed. It is negative other than that mentioned in the history of present illness.   PHYSICAL EXAM   BP 92/62 mmHg  Pulse 63  Ht 5\' 4"   (1.626 m)  Wt 161 lb 4 oz (73.143 kg)  BMI 27.67 kg/m2 Constitutional: She is oriented to person, place, and time. She appears well-developed and well-nourished. No distress.  HENT: No nasal discharge.  Head: Normocephalic and atraumatic.  Eyes: Pupils are equal and round. No discharge.  Neck: Normal range of motion. Neck supple. No JVD present. No thyromegaly present.  Cardiovascular: Normal rate, regular rhythm, normal heart sounds. Exam reveals no gallop and no friction rub. No murmur heard.  Pulmonary/Chest: Effort normal and breath sounds normal. No stridor. No respiratory distress. She has no wheezes. She has no rales. She exhibits no tenderness.  Abdominal: Soft. Bowel sounds are normal. She exhibits no distension. There is no tenderness. There is no rebound and no guarding.  Musculoskeletal: Normal range of motion. She exhibits no edema and no tenderness.  Neurological: She is alert and oriented to person, place, and time. Coordination normal.  Skin: Skin is warm and dry. No rash noted. She is not diaphoretic. No erythema. No pallor.  Psychiatric: She has a normal mood and affect. Her behavior is normal. Judgment and thought content normal.     WCB:JSEGBT sinus rhythm . No significant ST or T wave changes.   ASSESSMENT AND PLAN

## 2014-06-01 NOTE — Patient Instructions (Signed)
Stop Hydrochlorothiazide   Change Metoprolol to 25 mg twice daily.   Your physician wants you to follow-up in: 6 months.  You will receive a reminder letter in the mail two months in advance. If you don't receive a letter, please call our office to schedule the follow-up appointment.

## 2014-06-12 ENCOUNTER — Other Ambulatory Visit: Admit: 2014-06-12 | Disposition: A | Discharge status: Home / Self Care | Payer: Self-pay

## 2014-06-30 ENCOUNTER — Ambulatory Visit (INDEPENDENT_AMBULATORY_CARE_PROVIDER_SITE_OTHER): Payer: 59 | Admitting: Nurse Practitioner

## 2014-06-30 ENCOUNTER — Encounter: Payer: Self-pay | Admitting: Nurse Practitioner

## 2014-06-30 VITALS — BP 116/68 | HR 88 | Resp 14 | Ht 64.0 in | Wt 156.0 lb

## 2014-06-30 DIAGNOSIS — G2581 Restless legs syndrome: Secondary | ICD-10-CM

## 2014-06-30 MED ORDER — GABAPENTIN 300 MG PO CAPS
300.0000 mg | ORAL_CAPSULE | Freq: Every day | ORAL | Status: DC
Start: 2014-06-30 — End: 2015-06-03

## 2014-06-30 NOTE — Progress Notes (Signed)
   Subjective:    Patient ID: MARCINE GADWAY, female    DOB: [birth year], 39 y.o.   MRN: [id number]  HPI  Ms. Greening is a 39 yo female with a CC of restless leg syndrome.   1) Dr. Nicholas Lose Neurology was treating patient for RLS. She would prefer to follow up here since she is stable on gabapentin 300 mg daily.  Once at bedtime, gabapentin helpful, sleep study done and found RLS  Not tried any other medications.   Review of Systems  Constitutional: Negative for fever, chills, diaphoresis and fatigue.  Respiratory: Negative for chest tightness, shortness of breath and wheezing.   Cardiovascular: Negative for chest pain, palpitations and leg swelling.  Gastrointestinal: Negative for nausea, vomiting and diarrhea.  Skin: Negative for rash.  Neurological: Negative for dizziness, weakness, numbness and headaches.  Psychiatric/Behavioral: The patient is not nervous/anxious.       Objective:   Physical Exam  Constitutional: She is oriented to person, place, and time.  BP 116/68 mmHg  Pulse 88  Resp 14  Ht 5\' 4"  (1.626 m)  Wt 156 lb (70.761 kg)  BMI 26.76 kg/m2  SpO2 99%   Musculoskeletal: Normal range of motion. She exhibits no edema or tenderness.  Neurological: She is alert and oriented to person, place, and time. Coordination normal.  Skin: Skin is warm and dry. No rash noted.  Psychiatric: She has a normal mood and affect. Her behavior is normal. Thought content normal.          Assessment & Plan:

## 2014-06-30 NOTE — Patient Instructions (Signed)
Let me know if you need anything.

## 2014-06-30 NOTE — Assessment & Plan Note (Signed)
Stable on Gabapentin. Since she is stable she feels she just wants to follow up in [place]. Will write for 90 day supply with 3 refills. Takes 300 mg once at bedtime and finds it helpful. Will follow.

## 2014-06-30 NOTE — Progress Notes (Signed)
Pre visit review using our clinic review tool, if applicable. No additional management support is needed unless otherwise documented below in the visit note. 

## 2014-07-04 NOTE — Op Note (Signed)
PATIENT NAME:  Cathy Guerrero, Cathy Guerrero MR#:  [id number] DATE OF BIRTH:  1975-08-28  DATE OF PROCEDURE:  01/27/2014  PREOPERATIVE DIAGNOSIS: Morbid obesity.   POSTOPERATIVE DIAGNOSIS: Morbid obesity.   PROCEDURE: Laparoscopic sleeve gastrectomy.   SURGEON: Kreg Shropshire, MD   ASSISTANT:  None.  ANESTHESIA: General endotracheal.   FINDINGS: No significant hiatal hernia.   CLINICAL INDICATION: See history and physical.   DETAILS OF PROCEDURE: The patient was taken to the Operating Room, placed on the operating table in the supine position. Appropriate monitors and supple oxygen and broad-spectrum IV antibiotics were administered. The patient was placed under general anesthesia without incident. The abdomen was prepped and draped in the usual sterile fashion. The abdomen was accessed using a 5 mm optical trocar in the left upper outer quadrant.  Multiple trocars were placed in preparation for a laparoscopic sleeve gastrectomy. A liver retractor was placed. The hiatus was explored and no significant hiatal hernia was evident. A 36 French (Dictation Anomaly) visi-G  was inserted down in the antrum and the antrum was bisected using an echelon green load stapler.  The greater curvature of the stomach was fully mobilized from 5 cm from the pylorus all the way to the fundus and the posterior pancreatic attachments were taken down as well as short gastric using a Harmonic scalpel. This was done bloodlessly. Multiple fires of the gold load with SEAMGUARD were performed through the contralateral trocar on the patient's left side.  This was performed with gold load, reinforced with SEAMGUARD to create a path similar to the lesser curvature.  The excess gastric tissue was removed through the 50 mm port and sent for pathologic evaluation. The staple line secured.  No bleeding was present. No evidence of leak was present.  Trocars removed without incident as well as liver retractor and the wounds closed using 4-0 Vicryl  and Dermabond. The patient tolerated the procedure well and arrived to recovery and she will be admitted for further care.      ____________________________ Kreg Shropshire, MD jb:DT D: 01/27/2014 15:20:05 ET T: 01/27/2014 16:36:56 ET JOB#: [id number]  cc: Kreg Shropshire, MD, <Dictator> Wille Glaser Langley Adie MD ELECTRONICALLY SIGNED 01/27/2014 20:26

## 2014-07-06 LAB — SURGICAL PATHOLOGY

## 2014-07-28 ENCOUNTER — Other Ambulatory Visit
Admission: RE | Admit: 2014-07-28 | Discharge: 2014-07-28 | Disposition: A | Discharge status: Home / Self Care | Payer: 59 | Source: Ambulatory Visit | Attending: Dermatology | Admitting: Dermatology

## 2014-07-28 ENCOUNTER — Ambulatory Visit
Admission: RE | Admit: 2014-07-28 | Discharge: 2014-07-28 | Disposition: A | Discharge status: Home / Self Care | Payer: 59 | Source: Ambulatory Visit | Attending: Specialist | Admitting: Specialist

## 2014-07-28 ENCOUNTER — Other Ambulatory Visit: Payer: Self-pay | Admitting: Specialist

## 2014-07-28 DIAGNOSIS — R1011 Right upper quadrant pain: Secondary | ICD-10-CM

## 2014-07-28 DIAGNOSIS — B351 Tinea unguium: Secondary | ICD-10-CM | POA: Diagnosis present

## 2014-07-28 DIAGNOSIS — Z5181 Encounter for therapeutic drug level monitoring: Secondary | ICD-10-CM | POA: Insufficient documentation

## 2014-07-28 DIAGNOSIS — Z79899 Other long term (current) drug therapy: Secondary | ICD-10-CM | POA: Diagnosis present

## 2014-07-28 LAB — IRON: Iron: 70 ug/dL (ref 28–170)

## 2014-07-28 LAB — COMPREHENSIVE METABOLIC PANEL
ALT: 12 U/L — ABNORMAL LOW (ref 14–54)
AST: 13 U/L — AB (ref 15–41)
Albumin: 4 g/dL (ref 3.5–5.0)
Alkaline Phosphatase: 52 U/L (ref 38–126)
Anion gap: 6 (ref 5–15)
BUN: 12 mg/dL (ref 6–20)
CO2: 27 mmol/L (ref 22–32)
CREATININE: 0.71 mg/dL (ref 0.44–1.00)
Calcium: 8.8 mg/dL — ABNORMAL LOW (ref 8.9–10.3)
Chloride: 108 mmol/L (ref 101–111)
GFR calc Af Amer: >60 mL/min (ref >60)
Glucose, Bld: 99 mg/dL (ref 65–99)
Potassium: 3.6 mmol/L (ref 3.5–5.1)
Sodium: 141 mmol/L (ref 135–145)
TOTAL PROTEIN: 6.7 g/dL (ref 6.5–8.1)
Total Bilirubin: 0.5 mg/dL (ref 0.3–1.2)

## 2014-07-28 LAB — CBC WITH DIFFERENTIAL/PLATELET
BASOS ABS: 0 10*3/uL (ref 0–0.1)
Basophils Relative: 0 %
Eosinophils Absolute: 0.1 10*3/uL (ref 0–0.7)
Eosinophils Relative: 2 %
HCT: 36.3 % (ref 35.0–47.0)
Hemoglobin: 12.2 g/dL (ref 12.0–16.0)
LYMPHS ABS: 2.4 10*3/uL (ref 1.0–3.6)
Lymphocytes Relative: 41 %
MCH: 28.7 pg (ref 26.0–34.0)
MCHC: 33.7 g/dL (ref 32.0–36.0)
MCV: 85.2 fL (ref 80.0–100.0)
Monocytes Absolute: 0.4 10*3/uL (ref 0.2–0.9)
Monocytes Relative: 7 %
NEUTROS ABS: 3 10*3/uL (ref 1.4–6.5)
Neutrophils Relative %: 50 %
Platelets: 245 10*3/uL (ref 150–440)
RBC: 4.26 MIL/uL (ref 3.80–5.20)
RDW: 13 % (ref 11.5–14.5)
WBC: 6 10*3/uL (ref 3.6–11.0)

## 2014-07-28 LAB — PREALBUMIN: PREALBUMIN: 21.5 mg/dL (ref 18–38)

## 2014-07-28 LAB — PHOSPHORUS: PHOSPHORUS: 3.8 mg/dL (ref 2.5–4.6)

## 2014-07-28 LAB — VITAMIN B12: VITAMIN B 12: 261 pg/mL (ref 180–914)

## 2014-07-28 LAB — AMYLASE: Amylase: 66 U/L (ref 28–100)

## 2014-07-28 LAB — MAGNESIUM: Magnesium: 1.9 mg/dL (ref 1.7–2.4)

## 2014-07-28 LAB — FOLATE: Folate: 13.5 ng/mL (ref >5.9)

## 2014-07-28 LAB — FERRITIN: FERRITIN: 38 ng/mL (ref 11–307)

## 2014-07-30 ENCOUNTER — Ambulatory Visit
Admission: RE | Admit: 2014-07-30 | Discharge: 2014-07-30 | Disposition: A | Discharge status: Home / Self Care | Payer: 59 | Source: Ambulatory Visit | Attending: Specialist | Admitting: Specialist

## 2014-07-30 DIAGNOSIS — R1011 Right upper quadrant pain: Secondary | ICD-10-CM | POA: Insufficient documentation

## 2014-07-30 MED ORDER — SINCALIDE 5 MCG IJ SOLR
0.0200 ug/kg | Freq: Once | INTRAMUSCULAR | Status: AC
  Administered 2014-07-30: 1.4 ug via INTRAVENOUS

## 2014-07-30 MED ORDER — TECHNETIUM TC 99M MEBROFENIN IV KIT
5.0000 | PACK | Freq: Once | INTRAVENOUS | Status: AC | PRN
  Administered 2014-07-30: 4.84 via INTRAVENOUS

## 2014-08-01 LAB — VITAMIN D 1,25 DIHYDROXY
VITAMIN D 1, 25 (OH) TOTAL: 80 pg/mL
Vitamin D2 1, 25 (OH)2: <10 pg/mL
Vitamin D3 1, 25 (OH)2: 77 pg/mL

## 2014-08-25 ENCOUNTER — Encounter: Payer: Self-pay | Admitting: *Deleted

## 2014-08-25 ENCOUNTER — Other Ambulatory Visit: Payer: 59

## 2014-08-25 DIAGNOSIS — K219 Gastro-esophageal reflux disease without esophagitis: Secondary | ICD-10-CM | POA: Diagnosis not present

## 2014-08-25 DIAGNOSIS — Z8261 Family history of arthritis: Secondary | ICD-10-CM | POA: Diagnosis not present

## 2014-08-25 DIAGNOSIS — Z803 Family history of malignant neoplasm of breast: Secondary | ICD-10-CM | POA: Diagnosis not present

## 2014-08-25 DIAGNOSIS — F419 Anxiety disorder, unspecified: Secondary | ICD-10-CM | POA: Diagnosis not present

## 2014-08-25 DIAGNOSIS — K811 Chronic cholecystitis: Secondary | ICD-10-CM | POA: Diagnosis present

## 2014-08-25 DIAGNOSIS — Z888 Allergy status to other drugs, medicaments and biological substances status: Secondary | ICD-10-CM | POA: Diagnosis not present

## 2014-08-25 DIAGNOSIS — G2581 Restless legs syndrome: Secondary | ICD-10-CM | POA: Diagnosis not present

## 2014-08-25 DIAGNOSIS — D649 Anemia, unspecified: Secondary | ICD-10-CM | POA: Diagnosis not present

## 2014-08-25 DIAGNOSIS — Z823 Family history of stroke: Secondary | ICD-10-CM | POA: Diagnosis not present

## 2014-08-25 DIAGNOSIS — R1011 Right upper quadrant pain: Secondary | ICD-10-CM | POA: Diagnosis not present

## 2014-08-25 DIAGNOSIS — Z833 Family history of diabetes mellitus: Secondary | ICD-10-CM | POA: Diagnosis not present

## 2014-08-25 DIAGNOSIS — E669 Obesity, unspecified: Secondary | ICD-10-CM | POA: Diagnosis not present

## 2014-08-25 DIAGNOSIS — Z881 Allergy status to other antibiotic agents status: Secondary | ICD-10-CM | POA: Diagnosis not present

## 2014-08-25 DIAGNOSIS — I472 Ventricular tachycardia: Secondary | ICD-10-CM | POA: Diagnosis not present

## 2014-08-25 DIAGNOSIS — G4730 Sleep apnea, unspecified: Secondary | ICD-10-CM | POA: Diagnosis not present

## 2014-08-25 DIAGNOSIS — M1990 Unspecified osteoarthritis, unspecified site: Secondary | ICD-10-CM | POA: Diagnosis not present

## 2014-08-25 DIAGNOSIS — I471 Supraventricular tachycardia: Secondary | ICD-10-CM | POA: Diagnosis not present

## 2014-08-25 DIAGNOSIS — R51 Headache: Secondary | ICD-10-CM | POA: Diagnosis not present

## 2014-08-25 DIAGNOSIS — M797 Fibromyalgia: Secondary | ICD-10-CM | POA: Diagnosis not present

## 2014-08-25 DIAGNOSIS — Z8249 Family history of ischemic heart disease and other diseases of the circulatory system: Secondary | ICD-10-CM | POA: Diagnosis not present

## 2014-08-25 DIAGNOSIS — Z79899 Other long term (current) drug therapy: Secondary | ICD-10-CM | POA: Diagnosis not present

## 2014-08-25 DIAGNOSIS — Z6825 Body mass index (BMI) 25.0-25.9, adult: Secondary | ICD-10-CM | POA: Diagnosis not present

## 2014-08-25 DIAGNOSIS — Z836 Family history of other diseases of the respiratory system: Secondary | ICD-10-CM | POA: Diagnosis not present

## 2014-08-25 DIAGNOSIS — Z9884 Bariatric surgery status: Secondary | ICD-10-CM | POA: Diagnosis not present

## 2014-08-25 DIAGNOSIS — R000 Tachycardia, unspecified: Secondary | ICD-10-CM | POA: Diagnosis not present

## 2014-08-25 DIAGNOSIS — M069 Rheumatoid arthritis, unspecified: Secondary | ICD-10-CM | POA: Diagnosis not present

## 2014-08-25 DIAGNOSIS — I10 Essential (primary) hypertension: Secondary | ICD-10-CM | POA: Diagnosis not present

## 2014-08-25 NOTE — Patient Instructions (Signed)
  Your procedure is scheduled on: 09-01-14 Report to [place] To find out your arrival time please call [phone] between 1PM - 3PM on 08-31-14 [facility])  Remember: Instructions that are not followed completely may result in serious medical risk, up to and including death, or upon the discretion of your surgeon and anesthesiologist your surgery may need to be rescheduled.    _X___ 1. Do not eat food or drink liquids after midnight. No gum chewing or hard candies.     _X___ 2. No Alcohol for 24 hours before or after surgery.   ____ 3. Bring all medications with you on the day of surgery if instructed.    _X___ 4. Notify your doctor if there is any change in your medical condition     (cold, fever, infections).     Do not wear jewelry, make-up, hairpins, clips or nail polish.  Do not wear lotions, powders, or perfumes. You may wear deodorant.  Do not shave 48 hours prior to surgery. Men may shave face and neck.  Do not bring valuables to the hospital.    [facility] is not responsible for any belongings or valuables.               Contacts, dentures or bridgework may not be worn into surgery.  Leave your suitcase in the car. After surgery it may be brought to your room.  For patients admitted to the hospital, discharge time is determined by your treatment team.   Patients discharged the day of surgery will not be allowed to drive home.   Please read over the following fact sheets that you were given:     _X___ Take these medicines the morning of surgery with A SIP OF WATER:    1. OMEPRAZOLE  2. XANAX  3.   4.  5.  6.  ____ Fleet Enema (as directed)   _X___ Use CHG Soap as directed  ____ Use inhalers on the day of surgery  ____ Stop metformin 2 days prior to surgery    ____ Take 1/2 of usual insulin dose the night before surgery and none on the morning of surgery.   ____ Stop Coumadin/Plavix/aspirin-N/A  ____ Stop Anti-inflammatories-NO  NSAIDS OR ASA PRODUCTS-TYLENOL OK    _X___ Stop supplements until after surgery-STOP BIOTIN AND B COMPLEX NOW  ____ Bring C-Pap to the hospital.

## 2014-08-31 NOTE — OR Nursing (Signed)
Called Dr Darnell Level regarding allergy to amoxicillin.  Office to call back.

## 2014-09-01 ENCOUNTER — Encounter: Payer: Self-pay | Admitting: Specialist

## 2014-09-01 ENCOUNTER — Encounter
Admission: RE | Disposition: A | Discharge status: Home / Self Care | Payer: Self-pay | Source: Ambulatory Visit | Attending: Specialist

## 2014-09-01 ENCOUNTER — Ambulatory Visit: Payer: 59 | Admitting: Anesthesiology

## 2014-09-01 ENCOUNTER — Ambulatory Visit
Admission: RE | Admit: 2014-09-01 | Discharge: 2014-09-01 | Disposition: A | Discharge status: Home / Self Care | Payer: 59 | Source: Ambulatory Visit | Attending: Specialist | Admitting: Specialist

## 2014-09-01 DIAGNOSIS — Z833 Family history of diabetes mellitus: Secondary | ICD-10-CM | POA: Insufficient documentation

## 2014-09-01 DIAGNOSIS — F419 Anxiety disorder, unspecified: Secondary | ICD-10-CM | POA: Insufficient documentation

## 2014-09-01 DIAGNOSIS — E669 Obesity, unspecified: Secondary | ICD-10-CM | POA: Insufficient documentation

## 2014-09-01 DIAGNOSIS — Z8249 Family history of ischemic heart disease and other diseases of the circulatory system: Secondary | ICD-10-CM | POA: Insufficient documentation

## 2014-09-01 DIAGNOSIS — I471 Supraventricular tachycardia: Secondary | ICD-10-CM | POA: Insufficient documentation

## 2014-09-01 DIAGNOSIS — K811 Chronic cholecystitis: Secondary | ICD-10-CM | POA: Diagnosis not present

## 2014-09-01 DIAGNOSIS — G4730 Sleep apnea, unspecified: Secondary | ICD-10-CM | POA: Insufficient documentation

## 2014-09-01 DIAGNOSIS — I10 Essential (primary) hypertension: Secondary | ICD-10-CM | POA: Insufficient documentation

## 2014-09-01 DIAGNOSIS — R1011 Right upper quadrant pain: Secondary | ICD-10-CM | POA: Insufficient documentation

## 2014-09-01 DIAGNOSIS — Z823 Family history of stroke: Secondary | ICD-10-CM | POA: Insufficient documentation

## 2014-09-01 DIAGNOSIS — Z836 Family history of other diseases of the respiratory system: Secondary | ICD-10-CM | POA: Insufficient documentation

## 2014-09-01 DIAGNOSIS — K219 Gastro-esophageal reflux disease without esophagitis: Secondary | ICD-10-CM | POA: Insufficient documentation

## 2014-09-01 DIAGNOSIS — Z6825 Body mass index (BMI) 25.0-25.9, adult: Secondary | ICD-10-CM | POA: Insufficient documentation

## 2014-09-01 DIAGNOSIS — M797 Fibromyalgia: Secondary | ICD-10-CM | POA: Insufficient documentation

## 2014-09-01 DIAGNOSIS — Z79899 Other long term (current) drug therapy: Secondary | ICD-10-CM | POA: Insufficient documentation

## 2014-09-01 DIAGNOSIS — Z9884 Bariatric surgery status: Secondary | ICD-10-CM | POA: Insufficient documentation

## 2014-09-01 DIAGNOSIS — I472 Ventricular tachycardia: Secondary | ICD-10-CM | POA: Insufficient documentation

## 2014-09-01 DIAGNOSIS — G2581 Restless legs syndrome: Secondary | ICD-10-CM | POA: Insufficient documentation

## 2014-09-01 DIAGNOSIS — Z8261 Family history of arthritis: Secondary | ICD-10-CM | POA: Insufficient documentation

## 2014-09-01 DIAGNOSIS — R51 Headache: Secondary | ICD-10-CM | POA: Insufficient documentation

## 2014-09-01 DIAGNOSIS — M1990 Unspecified osteoarthritis, unspecified site: Secondary | ICD-10-CM | POA: Insufficient documentation

## 2014-09-01 DIAGNOSIS — Z888 Allergy status to other drugs, medicaments and biological substances status: Secondary | ICD-10-CM | POA: Insufficient documentation

## 2014-09-01 DIAGNOSIS — Z881 Allergy status to other antibiotic agents status: Secondary | ICD-10-CM | POA: Insufficient documentation

## 2014-09-01 DIAGNOSIS — R000 Tachycardia, unspecified: Secondary | ICD-10-CM | POA: Insufficient documentation

## 2014-09-01 DIAGNOSIS — D649 Anemia, unspecified: Secondary | ICD-10-CM | POA: Insufficient documentation

## 2014-09-01 DIAGNOSIS — M069 Rheumatoid arthritis, unspecified: Secondary | ICD-10-CM | POA: Insufficient documentation

## 2014-09-01 DIAGNOSIS — Z803 Family history of malignant neoplasm of breast: Secondary | ICD-10-CM | POA: Insufficient documentation

## 2014-09-01 HISTORY — DX: Gastro-esophageal reflux disease without esophagitis: K21.9

## 2014-09-01 HISTORY — DX: Anemia, unspecified: D64.9

## 2014-09-01 HISTORY — DX: Sleep apnea, unspecified: G47.30

## 2014-09-01 HISTORY — DX: Fibromyalgia: M79.7

## 2014-09-01 HISTORY — PX: CHOLECYSTECTOMY: SHX55

## 2014-09-01 HISTORY — DX: Anxiety disorder, unspecified: F41.9

## 2014-09-01 LAB — POCT PREGNANCY, URINE
Preg Test, Ur: NEGATIVE
Preg Test, Ur: NEGATIVE

## 2014-09-01 SURGERY — LAPAROSCOPIC CHOLECYSTECTOMY
Anesthesia: General | Wound class: Clean Contaminated

## 2014-09-01 MED ORDER — FENTANYL CITRATE (PF) 100 MCG/2ML IJ SOLN
INTRAMUSCULAR | Status: DC | PRN
  Administered 2014-09-01: 100 ug via INTRAVENOUS
  Administered 2014-09-01: 50 ug via INTRAVENOUS

## 2014-09-01 MED ORDER — HYDROMORPHONE HCL 1 MG/ML IJ SOLN: 0.2500 mg | INTRAMUSCULAR | Status: DC | PRN

## 2014-09-01 MED ORDER — ESMOLOL HCL 10 MG/ML IV SOLN: INTRAVENOUS | Status: DC | PRN

## 2014-09-01 MED ORDER — CLINDAMYCIN PHOSPHATE 600 MG/50ML IV SOLN
INTRAVENOUS | Status: AC
  Filled 2014-09-01: qty 50

## 2014-09-01 MED ORDER — ESMOLOL HCL 10 MG/ML IV SOLN
INTRAVENOUS | Status: DC | PRN
  Administered 2014-09-01: 10 mg via INTRAVENOUS

## 2014-09-01 MED ORDER — ONDANSETRON HCL 4 MG/2ML IJ SOLN
INTRAMUSCULAR | Status: AC
  Administered 2014-09-01: 4 mg via INTRAVENOUS
  Filled 2014-09-01: qty 2

## 2014-09-01 MED ORDER — ACETAMINOPHEN 10 MG/ML IV SOLN
INTRAVENOUS | Status: AC
  Filled 2014-09-01: qty 100

## 2014-09-01 MED ORDER — LIDOCAINE HCL (CARDIAC) 20 MG/ML IV SOLN
INTRAVENOUS | Status: DC | PRN
  Administered 2014-09-01: 100 mg via INTRAVENOUS

## 2014-09-01 MED ORDER — PROPOFOL 10 MG/ML IV BOLUS
INTRAVENOUS | Status: DC | PRN
  Administered 2014-09-01: 150 mg via INTRAVENOUS

## 2014-09-01 MED ORDER — LACTATED RINGERS IV SOLN
INTRAVENOUS | Status: DC
  Administered 2014-09-01 (×2): via INTRAVENOUS

## 2014-09-01 MED ORDER — LIDOCAINE-EPINEPHRINE (PF) 1 %-1:200000 IJ SOLN
INTRAMUSCULAR | Status: AC
  Filled 2014-09-01: qty 30

## 2014-09-01 MED ORDER — CLINDAMYCIN PHOSPHATE 600 MG/50ML IV SOLN
600.0000 mg | Freq: Once | INTRAVENOUS | Status: AC
  Administered 2014-09-01: 600 mg via INTRAVENOUS

## 2014-09-01 MED ORDER — GLYCOPYRROLATE 0.2 MG/ML IJ SOLN
INTRAMUSCULAR | Status: DC | PRN
  Administered 2014-09-01: 0.6 mg via INTRAVENOUS

## 2014-09-01 MED ORDER — NEOSTIGMINE METHYLSULFATE 10 MG/10ML IV SOLN
INTRAVENOUS | Status: DC | PRN
  Administered 2014-09-01: 4 mg via INTRAVENOUS

## 2014-09-01 MED ORDER — BUPIVACAINE-EPINEPHRINE (PF) 0.5% -1:200000 IJ SOLN
INTRAMUSCULAR | Status: AC
  Filled 2014-09-01: qty 30

## 2014-09-01 MED ORDER — HYDROCODONE-ACETAMINOPHEN 5-325 MG PO TABS: 1.0000 | ORAL_TABLET | Freq: Four times a day (QID) | ORAL | Status: DC | PRN

## 2014-09-01 MED ORDER — BUPIVACAINE-EPINEPHRINE (PF) 0.5% -1:200000 IJ SOLN
INTRAMUSCULAR | Status: DC | PRN
  Administered 2014-09-01: 9 mL

## 2014-09-01 MED ORDER — FENTANYL CITRATE (PF) 100 MCG/2ML IJ SOLN
INTRAMUSCULAR | Status: AC
  Administered 2014-09-01: 25 ug via INTRAVENOUS
  Filled 2014-09-01: qty 2

## 2014-09-01 MED ORDER — ACETAMINOPHEN 10 MG/ML IV SOLN
INTRAVENOUS | Status: DC | PRN
  Administered 2014-09-01: 1000 mg via INTRAVENOUS

## 2014-09-01 MED ORDER — ONDANSETRON HCL 4 MG/2ML IJ SOLN
INTRAMUSCULAR | Status: DC | PRN
  Administered 2014-09-01: 4 mg via INTRAVENOUS

## 2014-09-01 MED ORDER — LIDOCAINE-EPINEPHRINE (PF) 1 %-1:200000 IJ SOLN
INTRAMUSCULAR | Status: DC | PRN
  Administered 2014-09-01: 5 mL

## 2014-09-01 MED ORDER — ONDANSETRON HCL 4 MG/2ML IJ SOLN
4.0000 mg | Freq: Once | INTRAMUSCULAR | Status: AC | PRN
  Administered 2014-09-01: 4 mg via INTRAVENOUS

## 2014-09-01 MED ORDER — ROCURONIUM BROMIDE 100 MG/10ML IV SOLN
INTRAVENOUS | Status: DC | PRN
  Administered 2014-09-01: 30 mg via INTRAVENOUS

## 2014-09-01 MED ORDER — DEXAMETHASONE SODIUM PHOSPHATE 4 MG/ML IJ SOLN
INTRAMUSCULAR | Status: DC | PRN
  Administered 2014-09-01: 10 mg via INTRAVENOUS

## 2014-09-01 MED ORDER — EPHEDRINE SULFATE 50 MG/ML IJ SOLN
INTRAMUSCULAR | Status: DC | PRN
  Administered 2014-09-01: 5 mg via INTRAVENOUS
  Administered 2014-09-01: 10 mg via INTRAVENOUS

## 2014-09-01 MED ORDER — CLINDAMYCIN PHOSPHATE 600 MG/50ML IV SOLN
INTRAVENOUS | Status: AC
  Administered 2014-09-01: 600 mg via INTRAVENOUS
  Filled 2014-09-01: qty 50

## 2014-09-01 MED ORDER — FENTANYL CITRATE (PF) 100 MCG/2ML IJ SOLN
25.0000 ug | INTRAMUSCULAR | Status: DC | PRN
  Administered 2014-09-01 (×4): 25 ug via INTRAVENOUS

## 2014-09-01 MED ORDER — MIDAZOLAM HCL 2 MG/2ML IJ SOLN
INTRAMUSCULAR | Status: DC | PRN
  Administered 2014-09-01: 2 mg via INTRAVENOUS

## 2014-09-01 SURGICAL SUPPLY — 62 items
ANCHOR TIS RET SYS 235ML (MISCELLANEOUS) ×3 IMPLANT
APPLIER CLIP ROT 10 11.4 M/L (STAPLE) ×3
APPLIER CLIP ROT 13.4 12 LRG (CLIP)
BLADE SURG SZ11 CARB STEEL (BLADE) ×3 IMPLANT
CANISTER SUCT 1200ML W/VALVE (MISCELLANEOUS) ×3 IMPLANT
CANNULA DILATOR  5MM W/SLV (CANNULA) ×1
CANNULA DILATOR 10 W/SLV (CANNULA) IMPLANT
CANNULA DILATOR 10MM W/SLV (CANNULA)
CANNULA DILATOR 5 W/SLV (CANNULA) ×2 IMPLANT
CATH REDDICK CHOLANGI 4FR 50CM (CATHETERS) IMPLANT
CHLORAPREP W/TINT 26ML (MISCELLANEOUS) ×3 IMPLANT
CLIP APPLIE ROT 10 11.4 M/L (STAPLE) ×1 IMPLANT
CLIP APPLIE ROT 13.4 12 LRG (CLIP) IMPLANT
CLOSURE WOUND 1/2 X4 (GAUZE/BANDAGES/DRESSINGS)
DEFOGGER SCOPE WARMER CLEARIFY (MISCELLANEOUS) ×3 IMPLANT
DRAPE SHEET LG 3/4 BI-LAMINATE (DRAPES) IMPLANT
DRSG TEGADERM 4X4.75 (GAUZE/BANDAGES/DRESSINGS) ×3 IMPLANT
ENDOPOUCH RETRIEVER 10 (MISCELLANEOUS) IMPLANT
FILTER LAP SMOKE EVAC STRL (MISCELLANEOUS) ×3 IMPLANT
GAUZE SPONGE 4X4 12PLY STRL (GAUZE/BANDAGES/DRESSINGS) IMPLANT
GLOVE BIO SURGEON STRL SZ 6.5 (GLOVE) ×6 IMPLANT
GLOVE BIO SURGEON STRL SZ8 (GLOVE) ×3 IMPLANT
GLOVE BIO SURGEONS STRL SZ 6.5 (GLOVE) ×3
GOWN STRL REUS W/ TWL LRG LVL3 (GOWN DISPOSABLE) ×3 IMPLANT
GOWN STRL REUS W/ TWL XL LVL3 (GOWN DISPOSABLE) ×2 IMPLANT
GOWN STRL REUS W/TWL LRG LVL3 (GOWN DISPOSABLE) ×6
GOWN STRL REUS W/TWL XL LVL3 (GOWN DISPOSABLE) ×4
IRRIGATION STRYKERFLOW (MISCELLANEOUS) ×1 IMPLANT
IRRIGATOR STRYKERFLOW (MISCELLANEOUS) ×3
IV NS 1000ML (IV SOLUTION) ×2
IV NS 1000ML BAXH (IV SOLUTION) ×1 IMPLANT
KIT RM TURNOVER STRD PROC AR (KITS) ×3 IMPLANT
LABEL OR SOLS (LABEL) ×3 IMPLANT
LIQUID BAND (GAUZE/BANDAGES/DRESSINGS) ×3 IMPLANT
NDL INSUFF 14G 150MM VS150000 (NEEDLE) IMPLANT
NDL INSUFF ACCESS 14 VERSASTEP (NEEDLE) ×3 IMPLANT
NEEDLE FILTER BLUNT 18X 1/2SAF (NEEDLE)
NEEDLE FILTER BLUNT 18X1 1/2 (NEEDLE) IMPLANT
NEEDLE HYPO 25X1 1.5 SAFETY (NEEDLE) ×3 IMPLANT
NS IRRIG 500ML POUR BTL (IV SOLUTION) ×3 IMPLANT
PACK LAP CHOLECYSTECTOMY (MISCELLANEOUS) ×3 IMPLANT
PAD GROUND ADULT SPLIT (MISCELLANEOUS) ×3 IMPLANT
SCISSORS METZENBAUM CVD 33 (INSTRUMENTS) ×3 IMPLANT
SEAL FOR SCOPE WARMER C3101 (MISCELLANEOUS) IMPLANT
SHEARS HARMONIC ACE PLUS 45CM (MISCELLANEOUS) ×3 IMPLANT
SLEEVE ENDOPATH XCEL 5M (ENDOMECHANICALS) ×6 IMPLANT
STRIP CLOSURE SKIN 1/2X4 (GAUZE/BANDAGES/DRESSINGS) IMPLANT
SUT CHROMIC 5 0 RB 1 27 (SUTURE) IMPLANT
SUT MNCRL 4-0 (SUTURE)
SUT MNCRL 4-0 27XMFL (SUTURE)
SUT VIC AB 0 CT2 27 (SUTURE) ×3 IMPLANT
SUT VIC AB 4-0 SH 27 (SUTURE) ×4
SUT VIC AB 4-0 SH 27XANBCTRL (SUTURE) ×2 IMPLANT
SUTURE MNCRL 4-0 27XMF (SUTURE) IMPLANT
SYR 3ML LL SCALE MARK (SYRINGE) IMPLANT
TROCAR SL VERSASTEP 5M LG  B (MISCELLANEOUS)
TROCAR SL VERSASTEP 5M LG B (MISCELLANEOUS) IMPLANT
TROCAR XCEL NON-BLD 11X100MML (ENDOMECHANICALS) ×3 IMPLANT
TROCAR XCEL NON-BLD 5MMX100MML (ENDOMECHANICALS) ×3 IMPLANT
TUBING INSUFFLATOR HEATED (MISCELLANEOUS) IMPLANT
TUBING INSUFFLATOR HI FLOW (MISCELLANEOUS) ×3 IMPLANT
WATER STERILE IRR 1000ML POUR (IV SOLUTION) ×3 IMPLANT

## 2014-09-01 NOTE — Anesthesia Postprocedure Evaluation (Signed)
  Anesthesia Post-op Note  Patient: Cathy Guerrero  Procedure(s) Performed: Procedure(s): LAPAROSCOPIC CHOLECYSTECTOMY (N/A)  Anesthesia type:General  Patient location: PACU  Post pain: Pain level controlled  Post assessment: Post-op Vital signs reviewed, Patient's Cardiovascular Status Stable, Respiratory Function Stable, Patent Airway and No signs of Nausea or vomiting  Post vital signs: Reviewed and stable  Last Vitals:  Filed Vitals:   09/01/14 1352  BP:   Pulse: 110  Temp:   Resp: 16    Level of consciousness: awake, alert  and patient cooperative  Complications: No apparent anesthesia complications

## 2014-09-01 NOTE — H&P (Signed)
Subjective: Patient is a 39 y.o. female presents with abdominal pain. Onset of symptoms was gradual starting 4 weeks ago with unchanged course since that time. The pain is located in the epigastrium without radiation. Patient describes the pain as none, intermittent, occurring about every 2 days and lasting 1-2 hours per episode and rated as moderate. Additional biliary/liver symptoms include RUQ Pain. Pancreatic symptoms include none.  Previous studies include see chart.  Patient Active Problem List   Diagnosis Date Noted  . RLS (restless legs syndrome) 06/30/2014  . Tachycardia 08/12/2013  . Obesity 08/12/2013  . Hypertension    Past Medical History  Diagnosis Date  . Syncope and collapse 3 years ago - Happened about 2 times - Last time     Seen by neuro and cardiology - increased heart rate related  . Heart murmur     Seen by Dr. Fletcher Anon - no murmur  . SVT (supraventricular tachycardia) Noticed 3 years ago    Followed by Dr. Fletcher Anon  . Hypertension 3 years ago  . Frequent headaches     Followed by Neurology - in [place] - stress  . Rheumatoid arthritis From [facility]    Remission 2 years  . [facility] spotted fever 5 years ago  . Anxiety   . GERD (gastroesophageal reflux disease)   . Fibromyalgia   . Anemia   . Sleep apnea     RESOLVED SINCE GASTRIC BYPASS SURGERY IN 2015    Past Surgical History  Procedure Laterality Date  . Appendectomy  1984  . Breast reduction surgery  2003  . Tubal ligation  2001  . Endometrial ablation  2010? Pt unsure  . Laparoscopic gastric sleeve resection      Prescriptions prior to admission  Medication Sig Dispense Refill Last Dose  . ALPRAZolam (XANAX) 0.25 MG tablet Take 0.25 mg by mouth as needed for anxiety.    09/01/2014 at Unknown time  . B Complex Vitamins (B COMPLEX-B12 PO) Take by mouth daily.   08/25/2014  . BIOTIN 5000 PO Take by mouth daily.   08/25/2014  . calcium-vitamin D (OSCAL WITH D) 500-200 MG-UNIT per tablet Take 1 tablet by  mouth daily.   08/25/2014  . gabapentin (NEURONTIN) 300 MG capsule Take 1 capsule (300 mg total) by mouth at bedtime. 90 capsule 3 08/31/2014 at Unknown time  . Multiple Vitamin (MULTIVITAMIN) tablet Take 1 tablet by mouth daily.   08/25/2014   Allergies  Allergen Reactions  . Amoxicillin Hives  . Enbrel [Etanercept]     History  Substance Use Topics  . Smoking status: Never Smoker   . Smokeless tobacco: Never Used  . Alcohol Use: No    Family History  Problem Relation Age of Onset  . Heart disease Mother   . Heart attack Mother   . Stroke Mother   . Hypertension Mother   . Hyperlipidemia Mother   . Arthritis Mother     Rheumatoid arthritis  . Kidney disease Mother   . Depression Mother   . Diabetes Mother   . Heart disease Father   . Heart attack Father   . Hypertension Father   . Hyperlipidemia Father   . Kidney disease Father   . Diabetes Father   . COPD Father   . Cancer Maternal Grandmother     Breast cancer  . Heart attack Maternal Grandmother   . Arthritis Maternal Grandfather     rheumatoid arthritis  . Diabetes Maternal Grandfather     Review of Systems  Pertinent items are noted in HPI.  Objective: Vital signs in last 24 hours: Temp:  [98.4 F (36.9 C)] 98.4 F (36.9 C) (06/21 1124) Pulse Rate:  [77] 77 (06/21 1124) Resp:  [16] 16 (06/21 1124) BP: (123)/(80) 123/80 mmHg (06/21 1124) SpO2:  [100 %] 100 % (06/21 1124)  BP 123/80 mmHg  Pulse 77  Temp(Src) 98.4 F (36.9 C) (Oral)  Resp 16  Ht 5\' 3"  (1.6 m)  Wt 64.864 kg (143 lb)  BMI 25.34 kg/m2  SpO2 100%  LMP 08/25/2006 General appearance: alert Lungs: clear to auscultation bilaterally Heart: regular rate and rhythm, S1, S2 normal, no murmur, click, rub or gallop Abdomen: soft, non-tender; bowel sounds normal; no masses,  no organomegaly  Data Review: none  Assessment/Plan: Chronic cholecystitis .  Patient has failed conservative therapy and wishes to proceed with surgical  intervention.  Discussed the risk of surgery including bile duct injury,  and the risks of general anesthetic including MI, CVA, sudden death or even reaction to anesthetic medications. The patient understands the risks, any and all questions were answered to the patient's satisfaction and they freely signed the consent for operation.  Date of Surgery Update (To be completed by Attending Surgeon day of surgery.)  There have been no significant clinical changes since the completion of the above H&P.

## 2014-09-01 NOTE — Transfer of Care (Signed)
Immediate Anesthesia Transfer of Care Note  Patient: Cathy Guerrero  Procedure(s) Performed: Procedure(s): LAPAROSCOPIC CHOLECYSTECTOMY (N/A)  Patient Location: PACU  Anesthesia Type:General  Level of Consciousness: awake, alert , oriented and patient cooperative  Airway & Oxygen Therapy: Patient Spontanous Breathing and Patient connected to face mask oxygen  Post-op Assessment: Report given to RN, Post -op Vital signs reviewed and stable and Patient moving all extremities X 4  Post vital signs: Reviewed and stable  Last Vitals:  Filed Vitals:   09/01/14 1335  BP: 123/58  Pulse: 135  Temp: 37.2 C  Resp: 17    Complications: No apparent anesthesia complications

## 2014-09-01 NOTE — Anesthesia Preprocedure Evaluation (Signed)
Anesthesia Evaluation  Patient identified by MRN, date of birth, ID band Patient awake    Reviewed: Allergy & Precautions, NPO status , Patient's Chart, lab work & pertinent test results  History of Anesthesia Complications Negative for: history of anesthetic complications  Airway Mallampati: II  TM Distance: >3 FB Neck ROM: Full    Dental no notable dental hx.  Braces:   Pulmonary neg pulmonary ROS,  breath sounds clear to auscultation  Pulmonary exam normal       Cardiovascular Exercise Tolerance: Good hypertension, negative cardio ROS Normal cardiovascular exam+ dysrhythmias Supra Ventricular Tachycardia Rhythm:Regular Rate:Normal     Neuro/Psych  Headaches, Anxiety    GI/Hepatic Neg liver ROS, GERD-  Medicated and Controlled,  Endo/Other  negative endocrine ROS  Renal/GU negative Renal ROS  negative genitourinary   Musculoskeletal negative musculoskeletal ROS (+) Arthritis -, Osteoarthritis,  Fibromyalgia -  Abdominal   Peds negative pediatric ROS (+)  Hematology  (+) anemia ,   Anesthesia Other Findings   Reproductive/Obstetrics negative OB ROS                             Anesthesia Physical Anesthesia Plan  ASA: III  Anesthesia Plan: General   Post-op Pain Management:    Induction: Intravenous  Airway Management Planned: Oral ETT  Additional Equipment:   Intra-op Plan:   Post-operative Plan: Extubation in OR  Informed Consent: I have reviewed the patients History and Physical, chart, labs and discussed the procedure including the risks, benefits and alternatives for the proposed anesthesia with the patient or authorized representative who has indicated his/her understanding and acceptance.   Dental advisory given  Plan Discussed with: Surgeon and CRNA  Anesthesia Plan Comments:         Anesthesia Quick Evaluation

## 2014-09-01 NOTE — Op Note (Addendum)
Preoperative diagnosis: Chronic cholecystitis Postoperative diagnosis: Same Procedure: Laparoscopic Cholecystectomy Surgeon: Bruce Complications: None Blood loss: None Specimens: Gallbladder  Anesthesia: Gen. Endotracheal  Clinical history: The patient is 39 year old female with chronic right upper quadrant pain and findings suggestive of chronic cholecystitis. The risks and benefits discussed with her regarding surgery and removal of her gallbladder and she elected to proceed.  Details of procedure: The patient was taken to the operating room placed on the operating table in supine position. Appropriate monitors and submental action were given. Broad-spectrum IV antibiotic given. Patient was placed under general anesthesia without incident. The abdomen is prepped and draped in the usual sterile fashion. Timeout was performed.  The abdomen was accessed using a 5 mm optical trocar in the epigastrium without incident. 3 other trochars were placed in preparation for the procedure. The patient was placed in reverse Trendelenburg position and rolled slightly toward the left. The bladder was notably chronically adhesed to the duodenum and multiple other omental adhesions which are taken down using Harmonic scalpel without bleeding. Cystic duct and cystic artery were dissected free from surrounding tissues. The cystic artery was divided using the Harmonic scalpel. The duct was clipped twice proximally once distally and then divided. The view of safety was obtained before dividing either the structures. The gallbladder is then removed up liver bed using Harmonic scalpel. The specimen was retrieved through a bag and sent for pathologic evaluation. The liver bed was free of bleeding and/or bile leak at the end of the procedure. The trochars removed without incident. The wounds were closed using 4-0 Vicryl and Dermabond. The patient tolerated the procedure well and arrived in recovery room in stable condition. She  will be discharged home today.

## 2014-09-01 NOTE — Discharge Instructions (Signed)
AMBULATORY SURGERY  DISCHARGE INSTRUCTIONS   1) The drugs that you were given will stay in your system until tomorrow so for the next 24 hours you should not:  A) Drive an automobile B) Make any legal decisions C) Drink any alcoholic beverage   2) You may resume regular meals tomorrow.  Today it is better to start with liquids and gradually work up to solid foods.  You may eat anything you prefer, but it is better to start with liquids, then soup and crackers, and gradually work up to solid foods.   3) Please notify your doctor immediately if you have any unusual bleeding, trouble breathing, redness and pain at the surgery site, drainage, fever, or pain not relieved by medication. 4)   5) Your post-operative visit with Dr.    George Ina                                 is: Date:                        Time:    Please call to schedule your post-operative visit.  6) Additional Instructions: 7)

## 2014-09-01 NOTE — Anesthesia Procedure Notes (Signed)
Procedure Name: Intubation Date/Time: 09/01/2014 12:38 PM Performed by: Silvana Newness Pre-anesthesia Checklist: Patient identified, Emergency Drugs available, Suction available, Patient being monitored and Timeout performed Patient Re-evaluated:Patient Re-evaluated prior to inductionOxygen Delivery Method: Circle system utilized Preoxygenation: Pre-oxygenation with 100% oxygen Intubation Type: IV induction Ventilation: Mask ventilation without difficulty Laryngoscope Size: Mac and 3 Grade View: Grade I Tube type: Oral Tube size: 7.0 mm Number of attempts: 1 Airway Equipment and Method: Rigid stylet Placement Confirmation: ETT inserted through vocal cords under direct vision,  positive ETCO2 and breath sounds checked- equal and bilateral Secured at: 22 cm Tube secured with: Tape Dental Injury: Teeth and Oropharynx as per pre-operative assessment

## 2014-09-02 LAB — SURGICAL PATHOLOGY

## 2014-10-13 ENCOUNTER — Other Ambulatory Visit
Admission: RE | Admit: 2014-10-13 | Discharge: 2014-10-13 | Disposition: A | Discharge status: Home / Self Care | Payer: 59 | Source: Ambulatory Visit | Attending: Specialist | Admitting: Specialist

## 2014-10-13 DIAGNOSIS — R1011 Right upper quadrant pain: Secondary | ICD-10-CM | POA: Diagnosis present

## 2014-10-13 LAB — HEPATIC FUNCTION PANEL
ALT: 16 U/L (ref 14–54)
AST: 13 U/L — ABNORMAL LOW (ref 15–41)
Albumin: 3.9 g/dL (ref 3.5–5.0)
Alkaline Phosphatase: 61 U/L (ref 38–126)
Bilirubin, Direct: <0.1 mg/dL — ABNORMAL LOW (ref 0.1–0.5)
TOTAL PROTEIN: 6.6 g/dL (ref 6.5–8.1)
Total Bilirubin: 0.5 mg/dL (ref 0.3–1.2)

## 2014-12-29 ENCOUNTER — Telehealth: Payer: Self-pay | Admitting: Cardiovascular Disease

## 2014-12-29 NOTE — Telephone Encounter (Signed)
Attempted to schedule fu from recall Patient not taking any meds and not having any issues does not wish to fu with cardiologist  Patient instructed to call in future if needing to be seen   Deleted recall

## 2015-02-08 ENCOUNTER — Other Ambulatory Visit
Admission: RE | Admit: 2015-02-08 | Discharge: 2015-02-08 | Disposition: A | Discharge status: Home / Self Care | Payer: 59 | Source: Ambulatory Visit | Attending: Specialist | Admitting: Specialist

## 2015-02-08 DIAGNOSIS — K912 Postsurgical malabsorption, not elsewhere classified: Secondary | ICD-10-CM | POA: Diagnosis present

## 2015-02-08 DIAGNOSIS — Z9884 Bariatric surgery status: Secondary | ICD-10-CM | POA: Diagnosis present

## 2015-02-08 LAB — CBC WITH DIFFERENTIAL/PLATELET
Basophils Absolute: 0 10*3/uL (ref 0–0.1)
Basophils Relative: 1 %
Eosinophils Absolute: 0.2 10*3/uL (ref 0–0.7)
Eosinophils Relative: 3 %
HEMATOCRIT: 36.5 % (ref 35.0–47.0)
HEMOGLOBIN: 12.6 g/dL (ref 12.0–16.0)
LYMPHS ABS: 3.1 10*3/uL (ref 1.0–3.6)
Lymphocytes Relative: 52 %
MCH: 28.8 pg (ref 26.0–34.0)
MCHC: 34.4 g/dL (ref 32.0–36.0)
MCV: 83.9 fL (ref 80.0–100.0)
Monocytes Absolute: 0.4 10*3/uL (ref 0.2–0.9)
Monocytes Relative: 6 %
NEUTROS ABS: 2.2 10*3/uL (ref 1.4–6.5)
NEUTROS PCT: 38 %
PLATELETS: 265 10*3/uL (ref 150–440)
RBC: 4.36 MIL/uL (ref 3.80–5.20)
RDW: 12.6 % (ref 11.5–14.5)
WBC: 6 10*3/uL (ref 3.6–11.0)

## 2015-02-08 LAB — COMPREHENSIVE METABOLIC PANEL
ALT: 12 U/L — ABNORMAL LOW (ref 14–54)
ANION GAP: 4 — AB (ref 5–15)
AST: 13 U/L — AB (ref 15–41)
Albumin: 3.8 g/dL (ref 3.5–5.0)
Alkaline Phosphatase: 61 U/L (ref 38–126)
BILIRUBIN TOTAL: 0.4 mg/dL (ref 0.3–1.2)
BUN: 16 mg/dL (ref 6–20)
CHLORIDE: 105 mmol/L (ref 101–111)
CO2: 30 mmol/L (ref 22–32)
Calcium: 8.8 mg/dL — ABNORMAL LOW (ref 8.9–10.3)
Creatinine, Ser: 0.71 mg/dL (ref 0.44–1.00)
Glucose, Bld: 99 mg/dL (ref 65–99)
POTASSIUM: 4.3 mmol/L (ref 3.5–5.1)
Sodium: 139 mmol/L (ref 135–145)
TOTAL PROTEIN: 6.6 g/dL (ref 6.5–8.1)

## 2015-02-08 LAB — PREALBUMIN: PREALBUMIN: 23.2 mg/dL (ref 18–38)

## 2015-02-08 LAB — MAGNESIUM: Magnesium: 2 mg/dL (ref 1.7–2.4)

## 2015-02-08 LAB — FOLATE: FOLATE: 9.5 ng/mL (ref >5.9)

## 2015-02-08 LAB — HEMOGLOBIN A1C: Hgb A1c MFr Bld: 5.3 % (ref 4.0–6.0)

## 2015-02-08 LAB — TSH: TSH: 1.888 u[IU]/mL (ref 0.350–4.500)

## 2015-02-08 LAB — IRON: Iron: 62 ug/dL (ref 28–170)

## 2015-02-08 LAB — VITAMIN B12: VITAMIN B 12: 473 pg/mL (ref 180–914)

## 2015-02-08 LAB — FERRITIN: FERRITIN: 28 ng/mL (ref 11–307)

## 2015-02-10 LAB — PTH, INTACT AND CALCIUM
CALCIUM TOTAL (PTH): 9 mg/dL (ref 8.7–10.2)
PTH: 45 pg/mL (ref 15–65)

## 2015-02-10 LAB — VITAMIN B1: Vitamin B1 (Thiamine): 136.5 nmol/L (ref 66.5–200.0)

## 2015-02-10 LAB — COPPER, SERUM: Copper: 109 ug/dL (ref 72–166)

## 2015-02-10 LAB — VITAMIN E: Alpha-Tocopherol: 9.1 mg/L (ref 5.3–16.8)

## 2015-02-10 LAB — VITAMIN D 25 HYDROXY (VIT D DEFICIENCY, FRACTURES): VIT D 25 HYDROXY: 37.6 ng/mL (ref 30.0–100.0)

## 2015-02-10 LAB — ZINC: Zinc: 70 ug/dL (ref 56–134)

## 2015-02-10 LAB — VITAMIN A: Vitamin A (Retinoic Acid): 45 ug/dL (ref 20–65)

## 2015-02-15 LAB — MISC LABCORP TEST (SEND OUT): Labcorp test code: 121200

## 2015-03-24 ENCOUNTER — Other Ambulatory Visit: Payer: Self-pay

## 2015-03-24 DIAGNOSIS — Z1231 Encounter for screening mammogram for malignant neoplasm of breast: Secondary | ICD-10-CM

## 2015-04-08 ENCOUNTER — Ambulatory Visit: Payer: 59

## 2015-05-12 ENCOUNTER — Ambulatory Visit: Payer: 59

## 2015-05-25 ENCOUNTER — Ambulatory Visit: Payer: 59

## 2015-05-27 ENCOUNTER — Ambulatory Visit
Admission: RE | Admit: 2015-05-27 | Discharge: 2015-05-27 | Disposition: A | Discharge status: Home / Self Care | Payer: 59 | Source: Ambulatory Visit

## 2015-05-27 DIAGNOSIS — Z1231 Encounter for screening mammogram for malignant neoplasm of breast: Secondary | ICD-10-CM

## 2015-05-28 ENCOUNTER — Ambulatory Visit: Payer: 59

## 2015-05-28 ENCOUNTER — Other Ambulatory Visit: Payer: Self-pay | Admitting: *Deleted

## 2015-05-28 DIAGNOSIS — R928 Other abnormal and inconclusive findings on diagnostic imaging of breast: Secondary | ICD-10-CM

## 2015-05-31 DIAGNOSIS — H5213 Myopia, bilateral: Secondary | ICD-10-CM | POA: Diagnosis not present

## 2015-06-03 ENCOUNTER — Other Ambulatory Visit: Payer: Self-pay | Admitting: Nurse Practitioner

## 2015-06-03 NOTE — Telephone Encounter (Signed)
Last OV was 06/27/2014, please advise refill?

## 2015-06-04 ENCOUNTER — Ambulatory Visit
Admission: RE | Admit: 2015-06-04 | Discharge: 2015-06-04 | Disposition: A | Discharge status: Home / Self Care | Payer: 59 | Source: Ambulatory Visit | Attending: *Deleted | Admitting: *Deleted

## 2015-06-04 DIAGNOSIS — R928 Other abnormal and inconclusive findings on diagnostic imaging of breast: Secondary | ICD-10-CM

## 2015-06-14 ENCOUNTER — Emergency Department (HOSPITAL_COMMUNITY)
Admission: EM | Admit: 2015-06-14 | Discharge: 2015-06-14 | Disposition: A | Discharge status: Home / Self Care | Payer: 59 | Attending: Emergency Medicine | Admitting: Emergency Medicine

## 2015-06-14 ENCOUNTER — Encounter (HOSPITAL_COMMUNITY): Payer: Self-pay | Admitting: Emergency Medicine

## 2015-06-14 DIAGNOSIS — Z79899 Other long term (current) drug therapy: Secondary | ICD-10-CM | POA: Diagnosis not present

## 2015-06-14 DIAGNOSIS — I10 Essential (primary) hypertension: Secondary | ICD-10-CM | POA: Diagnosis not present

## 2015-06-14 DIAGNOSIS — I471 Supraventricular tachycardia: Secondary | ICD-10-CM | POA: Diagnosis not present

## 2015-06-14 DIAGNOSIS — R000 Tachycardia, unspecified: Secondary | ICD-10-CM | POA: Diagnosis present

## 2015-06-14 LAB — CBC WITH DIFFERENTIAL/PLATELET
Basophils Absolute: 0 10*3/uL (ref 0.0–0.1)
Basophils Relative: 0 %
EOS PCT: 1 %
Eosinophils Absolute: 0.1 10*3/uL (ref 0.0–0.7)
HEMATOCRIT: 43 % (ref 36.0–46.0)
Hemoglobin: 15.1 g/dL — ABNORMAL HIGH (ref 12.0–15.0)
LYMPHS ABS: 4.3 10*3/uL — AB (ref 0.7–4.0)
LYMPHS PCT: 42 %
MCH: 29.4 pg (ref 26.0–34.0)
MCHC: 35.1 g/dL (ref 30.0–36.0)
MCV: 83.7 fL (ref 78.0–100.0)
MONO ABS: 0.6 10*3/uL (ref 0.1–1.0)
Monocytes Relative: 6 %
NEUTROS ABS: 5.2 10*3/uL (ref 1.7–7.7)
Neutrophils Relative %: 51 %
PLATELETS: 412 10*3/uL — AB (ref 150–400)
RBC: 5.14 MIL/uL — AB (ref 3.87–5.11)
RDW: 12.2 % (ref 11.5–15.5)
WBC: 10.3 10*3/uL (ref 4.0–10.5)

## 2015-06-14 LAB — BASIC METABOLIC PANEL
ANION GAP: 12 (ref 5–15)
BUN: 13 mg/dL (ref 6–20)
CO2: 23 mmol/L (ref 22–32)
Calcium: 9.3 mg/dL (ref 8.9–10.3)
Chloride: 102 mmol/L (ref 101–111)
Creatinine, Ser: 0.75 mg/dL (ref 0.44–1.00)
GFR calc Af Amer: >60 mL/min (ref >60)
GLUCOSE: 150 mg/dL — AB (ref 65–99)
POTASSIUM: 3.8 mmol/L (ref 3.5–5.1)
Sodium: 137 mmol/L (ref 135–145)

## 2015-06-14 MED ORDER — ADENOSINE 6 MG/2ML IV SOLN
INTRAVENOUS | Status: AC
  Filled 2015-06-14: qty 2

## 2015-06-14 MED ORDER — ADENOSINE 6 MG/2ML IV SOLN
6.0000 mg | Freq: Once | INTRAVENOUS | Status: AC
  Administered 2015-06-14: 6 mg via INTRAVENOUS

## 2015-06-14 NOTE — ED Provider Notes (Signed)
CSN: [id number]     Arrival date & time 06/14/15  1510 History   First MD Initiated Contact with Patient 06/14/15 1518     Chief Complaint  Patient presents with  . Tachycardia    HPI Patient presents to emergency room with tachycardia.  Patient has a history of SVT. Not had any episodes for a few years. His evening about an hour ago she had sudden onset of tachycardia. She denies feeling lightheaded. She is having numbness and tingling in her hands and her mouth. She is having pain in her chest associated with the tachycardia. She denies any leg swelling. No fevers or chills. Past Medical History  Diagnosis Date  . Syncope and collapse 3 years ago - Happened about 2 times - Last time     Seen by neuro and cardiology - increased heart rate related  . Heart murmur     Seen by Dr. Fletcher Anon - no murmur  . SVT (supraventricular tachycardia) ([place]) Noticed 3 years ago    Followed by Dr. Fletcher Anon  . Hypertension 3 years ago  . Frequent headaches     Followed by Neurology - in [place] - stress  . Rheumatoid arthritis ([place]) From [facility]    Remission 2 years  . [facility] spotted fever 5 years ago  . Anxiety   . GERD (gastroesophageal reflux disease)   . Fibromyalgia   . Anemia   . Sleep apnea     RESOLVED SINCE GASTRIC BYPASS SURGERY IN 2015   Past Surgical History  Procedure Laterality Date  . Appendectomy  1984  . Breast reduction surgery  2003  . Tubal ligation  2001  . Endometrial ablation  2010? Pt unsure  . Laparoscopic gastric sleeve resection    . Cholecystectomy N/A 09/01/2014    Procedure: LAPAROSCOPIC CHOLECYSTECTOMY;  Surgeon: Bonner Puna, MD;  Location: [facility] ORS;  Service: General;  Laterality: N/A;   Family History  Problem Relation Age of Onset  . Heart disease Mother   . Heart attack Mother   . Stroke Mother   . Hypertension Mother   . Hyperlipidemia Mother   . Arthritis Mother     Rheumatoid arthritis  . Kidney disease Mother   . Depression Mother   . Diabetes  Mother   . Heart disease Father   . Heart attack Father   . Hypertension Father   . Hyperlipidemia Father   . Kidney disease Father   . Diabetes Father   . COPD Father   . Cancer Maternal Grandmother     Breast cancer  . Heart attack Maternal Grandmother   . Arthritis Maternal Grandfather     rheumatoid arthritis  . Diabetes Maternal Grandfather    Social History  Substance Use Topics  . Smoking status: Never Smoker   . Smokeless tobacco: Never Used  . Alcohol Use: No   OB History    No data available     Review of Systems  All other systems reviewed and are negative.     Allergies  Amoxicillin and Enbrel  Home Medications   Prior to Admission medications   Medication Sig Start Date End Date Taking? Authorizing Provider  ALPRAZolam [name] 0.25 MG tablet Take 0.25 mg by mouth as needed for anxiety.     Historical Provider, MD  B Complex Vitamins (B COMPLEX-B12 PO) Take by mouth daily.    Historical Provider, MD  BIOTIN 5000 PO Take by mouth daily.    Historical Provider, MD  calcium-vitamin D (  OSCAL WITH D) 500-200 MG-UNIT per tablet Take 1 tablet by mouth daily.    Historical Provider, MD  gabapentin (NEURONTIN) 300 MG capsule TAKE 1 CAPSULE (300 MG TOTAL) BY MOUTH AT BEDTIME. 06/04/15   Rubbie Battiest, NP  HYDROcodone-acetaminophen (NORCO) 5-325 MG per tablet Take 1-2 tablets by mouth every 6 (six) hours as needed for moderate pain or severe pain. 09/01/14   Bonner Puna, MD  Multiple Vitamin (MULTIVITAMIN) tablet Take 1 tablet by mouth daily.    Historical Provider, MD   BP 132/93 mmHg  Pulse 60  SpO2 98% Physical Exam  Constitutional: She appears distressed.  HENT:  Head: Normocephalic and atraumatic.  Right Ear: External ear normal.  Left Ear: External ear normal.  Eyes: Conjunctivae are normal. Right eye exhibits no discharge. Left eye exhibits no discharge. No scleral icterus.  Neck: Neck supple. No tracheal deviation present.  Cardiovascular: Regular  rhythm and intact distal pulses.  Tachycardia present.   Pulmonary/Chest: Effort normal and breath sounds normal. No stridor. No respiratory distress. She has no wheezes. She has no rales.  Abdominal: Soft. Bowel sounds are normal. She exhibits no distension. There is no tenderness. There is no rebound and no guarding.  Musculoskeletal: She exhibits no edema or tenderness.  Neurological: She is alert. She has normal strength. No cranial nerve deficit (no facial droop, extraocular movements intact, no slurred speech) or sensory deficit. She exhibits normal muscle tone. She displays no seizure activity. Coordination normal.  Skin: Skin is warm and dry. No rash noted. She is not diaphoretic.  Psychiatric: She has a normal mood and affect.  Nursing note and vitals reviewed.   ED Course  .Cardioversion Date/Time: 06/14/2015 3:39 PM Performed by: Dorie Rank Authorized by: Dorie Rank Consent: The procedure was performed in an emergent situation. Verbal consent obtained. Consent given by: patient Patient sedated: no Cardioversion basis: emergent Pre-procedure rhythm: supraventricular tachycardia Post-procedure rhythm: normal sinus rhythm Complications: no complications Comments: Attempted vagal maneuvers initially without success.  Patient was given 6 mg of adenosine. Initial rhythm after the adenosine was sinus tachycardia. Cardiac monitor is now showing a sinus rhythm with a rate in the 90s   Labs Review Labs Reviewed  CBC WITH DIFFERENTIAL/PLATELET - Abnormal; Notable for the following:    RBC 5.14 (*)    Hemoglobin 15.1 (*)    Platelets 412 (*)    Lymphs Abs 4.3 (*)    All other components within normal limits  BASIC METABOLIC PANEL - Abnormal; Notable for the following:    Glucose, Bld 150 (*)    All other components within normal limits       EKG Interpretation   Date/Time:  Monday June 14 2015 15:14:07 EDT Ventricular Rate:  175 PR Interval:    QRS Duration: 76 QT  Interval:  266 QTC Calculation: 453 R Axis:   55 Text Interpretation:  Supraventricular tachycardia Nonspecific ST and T  wave abnormality Abnormal ECG tachycardia is new since last tracing  Confirmed by Holly Pring  MD-J, Axiel Fjeld [id number]) on 06/14/2015 3:18:22 PM      MDM   Final diagnoses:  SVT (supraventricular tachycardia) (HCC)    Patient's laboratory tests are unremarkable. She was monitored in the emergency department for an hour and a half. She has had no further episodes of supraventricular tachycardia.  Patient is stable for discharge. She will follow up with her cardiologist.    Dorie Rank, MD 06/14/15 [phone]

## 2015-06-14 NOTE — ED Notes (Addendum)
Pt converted to Sinus Tach 104.

## 2015-06-14 NOTE — ED Notes (Signed)
Pt c/o tachycardia/cp since 1430. Pt states she took 50 mg metoprolol at 1430.

## 2015-06-14 NOTE — Discharge Instructions (Signed)
Paroxysmal Supraventricular Tachycardia Paroxysmal supraventricular tachycardia (PSVT) is a type of abnormal heart rhythm. It causes your heart to beat very quickly and then suddenly stop beating so quickly. A normal heart rate is 60-100 beats per minute. During an episode of PSVT, your heart rate may be 150-250 beats per minute. This can make you feel light-headed and short of breath. An episode of PSVT can be frightening. It is usually not dangerous. The heart has four chambers. All chambers need to work together for the heart to beat effectively. A normal heartbeat usually starts in the right upper chamber of the heart (atrium) when an area (sinoatrial node) puts out an electrical signal that spreads to the other chambers. People with PSVT may have abnormal electrical pathways, or they may have other areas in the upper chambers that send out electrical signals. The result is a very rapid heartbeat. When your heart beats very quickly, it does not have time to fill completely with blood. When PSVT happens often or it lasts for long periods, it can lead to heart weakness and failure. Most people with PSVT do not have any other heart disease. CAUSES Abnormal electrical activity in the heart causes PSVT. It is not known why some people get PSVT and others do not. RISK FACTORS You may be more likely to have PSVT if:  You are 20-30 years old.  You are a woman. Other factors that may increase your chances of an attack include:  Stress.  Being tired.  Smoking.  Stimulant drugs.  Alcoholic drinks.  Caffeine.  Pregnancy. SIGNS AND SYMPTOMS A mild episode of PSVT may cause no symptoms. If you do have signs and symptoms, they may include:  A pounding heart.  Feeling of skipped heartbeats (palpitations).  Weakness.  Shortness of breath.  Tightness or pain in your chest.  Light-headedness.  Anxiety.  Dizziness.  Sweating.  Nausea.  A fainting spell. DIAGNOSIS Your health care  provider may suspect PSVT if you have symptoms that come and go. The health care provider will do a physical exam. If you are having an episode during the exam, the health care provider may be able to diagnose PSVT by listening to your heart and feeling your pulse. Tests may also be done, including:  An electrical study of your heart (electrocardiogram, or ECG).  A test in which you wear a portable ECG monitor all day (Holter monitor) or for several days (event monitor).  A test that involves taking an image of your heart using sound waves (echocardiogram) to rule out other causes of a fast heart rate. TREATMENT You may not need treatment if episodes of PSVT do not happen often or if they do not cause symptoms. If PSVT episodes do cause symptoms, your health care provider may first suggest trying a self-treatment called vagus nerve stimulation. The vagus nerve extends down from the brain. It regulates certain body functions. Stimulating this nerve can slow down the heart. Your health care provider can teach you ways to do this. You may need to try a few ways to find what works best for you. Options include:  Holding your breath and pushing, as though you are having a bowel movement.  Massaging an area on one side of your neck below your jaw.  Bending forward with your head between your legs.  Bending forward with your head between your legs and coughing.  Massaging your eyeballs with your eyes closed. If vagus nerve stimulation does not work, other treatment options include:    Medicines to prevent an attack.  Being treated in the hospital with medicine or electric shock to stop an attack (cardioversion). This treatment can include:  Getting medicine through an IV line.  Having a small electric shock delivered to your heart. You will be given medicine to make you sleep through this procedure.  If you have frequent episodes with symptoms, you may need a procedure to get rid of the faulty  areas of your heart (radiofrequency ablation) and end the episodes of PSVT. In this procedure:  A long, thin tube (catheter) is passed through one of your veins into your heart.  Energy directed through the catheter eliminates the areas of your heart that are causing abnormal electric stimulation. HOME CARE INSTRUCTIONS  Take medicines only as directed by your health care provider.  Do not use caffeine in any form if caffeine triggers episodes of PSVT. Otherwise, consume caffeine in moderation. This means no more than a few cups of coffee or the equivalent each day.  Do not drink alcohol if alcohol triggers episodes of PSVT. Otherwise, limit alcohol intake to no more than 1 drink per day for nonpregnant women and 2 drinks per day for men. One drink equals 12 ounces of beer, 5 ounces of wine, or 1 ounces of hard liquor.  Do not use any tobacco products, including cigarettes, chewing tobacco, or electronic cigarettes. If you need help quitting, ask your health care provider.  Try to get at least 7 hours of sleep each night.  Find healthy ways to manage stress.  Perform vagus nerve stimulation as directed by your health care provider.  Maintain a healthy weight.  Get some exercise on most days. Ask your health care provider to suggest some good activities for you. SEEK MEDICAL CARE IF:  You are having episodes of PSVT more often, or they are lasting longer.  Vagus nerve stimulation is no longer helping.  You have new symptoms during an episode. SEEK IMMEDIATE MEDICAL CARE IF:  You have chest pain or trouble breathing.  You have an episode of PSVT that has lasted longer than 20 minutes.  You have passed out from an episode of PSVT. These symptoms may represent a serious problem that is an emergency. Do not wait to see if the symptoms will go away. Get medical help right away. Call your local emergency services (911 in the [place] Do not drive yourself to the hospital.   This  information is not intended to replace advice given to you by your health care provider. Make sure you discuss any questions you have with your health care provider.   Document Released: 02/27/2005 Document Revised: 03/20/2014 Document Reviewed: 08/07/2013 Elsevier Interactive Patient Education 2016 Elsevier Inc.  

## 2015-06-21 ENCOUNTER — Ambulatory Visit: Payer: 59

## 2015-09-03 MED FILL — ALPRAZolam 0.25 MG TABS: 0.25 | 30 days supply | Qty: 30 | Fill #0 | Status: TO

## 2015-09-15 ENCOUNTER — Other Ambulatory Visit: Payer: Self-pay | Admitting: *Deleted

## 2015-09-15 MED ORDER — GABAPENTIN 300 MG PO CAPS: ORAL_CAPSULE | ORAL | Status: DC

## 2015-09-15 NOTE — Telephone Encounter (Signed)
Was last refilled in March, no follow up appt scheduled.  Please advise.  Thanks

## 2015-09-15 NOTE — Telephone Encounter (Signed)
Patient has requested a medication refill for gabapentin  Pharmacy [facility]

## 2015-11-23 ENCOUNTER — Other Ambulatory Visit: Payer: Self-pay | Admitting: Family Medicine

## 2015-11-23 NOTE — Telephone Encounter (Signed)
Refilled 09/15/15. Pt last seen 06/30/14 with no future appt. A message was placed on RX for pt to follow up with someone in the office to get further refills. Please advise?

## 2016-01-24 ENCOUNTER — Other Ambulatory Visit: Payer: Self-pay | Admitting: Family Medicine

## 2016-01-24 NOTE — Telephone Encounter (Signed)
Pt last seen 06/27/2014. Refilled 11/23/15. Please advise?

## 2016-02-18 ENCOUNTER — Encounter: Payer: Self-pay | Admitting: Family Medicine

## 2016-02-18 ENCOUNTER — Ambulatory Visit (INDEPENDENT_AMBULATORY_CARE_PROVIDER_SITE_OTHER): Payer: 59 | Admitting: Family Medicine

## 2016-02-18 DIAGNOSIS — G2581 Restless legs syndrome: Secondary | ICD-10-CM

## 2016-02-18 DIAGNOSIS — K219 Gastro-esophageal reflux disease without esophagitis: Secondary | ICD-10-CM | POA: Diagnosis not present

## 2016-02-18 DIAGNOSIS — I10 Essential (primary) hypertension: Secondary | ICD-10-CM | POA: Diagnosis not present

## 2016-02-18 DIAGNOSIS — G4700 Insomnia, unspecified: Secondary | ICD-10-CM | POA: Insufficient documentation

## 2016-02-18 MED ORDER — ESZOPICLONE 1 MG PO TABS: 1.0000 mg | ORAL_TABLET | Freq: Every evening | ORAL | 1 refills | Status: DC | PRN

## 2016-02-18 MED ORDER — GABAPENTIN 300 MG PO CAPS: 300.0000 mg | ORAL_CAPSULE | Freq: Every day | ORAL | 1 refills | Status: DC

## 2016-02-18 MED ORDER — PANTOPRAZOLE SODIUM 40 MG PO TBEC: 40.0000 mg | DELAYED_RELEASE_TABLET | Freq: Every day | ORAL | 1 refills | Status: DC

## 2016-02-18 NOTE — Assessment & Plan Note (Signed)
Patient has a history of hypertension. Her blood pressures have been well controlled following weight loss. She's recently regained some weight. BP elevated today on repeat. 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 If it is persistently elevated greater than 130/80 she will need to follow-up for discussion about treatment.

## 2016-02-18 NOTE — Progress Notes (Signed)
Subjective:  Patient ID: Cathy Guerrero, female    DOB: [birth year]  Age: 40 y.o. MRN: [id number]  CC: Follow up, establish with me; also complains of insomnia  HPI:  40 year old female with restless leg, GERD, and a history of hypertension presents for follow-up. She also complains of insomnia.  GERD  Stable on Protonix. Needs refill.  RLS  Doing well on gabapentin at night.  Needs refill.  Insomnia  Patient works night shift.  Patient reports that over the past year she's had worsening insomnia.  She's been using Unisom and melatonin without dramatic improvement.  She states that she has trouble staying asleep. She falls asleep without difficulty and then wakes 3 hours later.  Unclear reasoning for the worsening other than working night shift.  No known exacerbating factors.  She would like to discuss additional treatment options today.  Social Hx   Social History   Social History  . Marital status: Married    Spouse name: Cathy Guerrero  . Number of children: 3  . Years of education: 90   Occupational History  . Respiratory Therapist [employer]   Social History Main Topics  . Smoking status: Never Smoker  . Smokeless tobacco: Never Used  . Alcohol use No  . Drug use: No  . Sexual activity: Yes    Birth control/ protection: Other-see comments     Comment: Ablation   Other Topics Concern  . None   Social History Narrative   Born in raised in [place], [place]. Currently resides in [place], [place]. Lives with husband, Cathy Guerrero, and 3 children. 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 Enjoys camping, shopping, reading, and involved in church. Likes to ride motorcycle with husband and wears helmet.    Review of Systems  Gastrointestinal:       GERD.  Psychiatric/Behavioral: Positive for sleep disturbance.   Objective:  BP (!) 148/75 (BP Location: Left Arm, Patient Position: Sitting, Cuff Size: Normal)   Pulse 76   Temp 98.3  F (36.8 C) (Oral)   Resp 14   Wt 173 lb 8 oz (78.7 kg)   SpO2 100%   BMI 30.73 kg/m   BP/Weight 02/18/2016 06/14/2015 [id number]  Systolic BP 123456 [id number]  Diastolic BP 75 82 72  Wt. (Lbs) 173.5 - -  BMI 30.73 - 25.34   Physical Exam  Constitutional: She is oriented to person, place, and time. She appears well-developed. No distress.  Cardiovascular: Normal rate and regular rhythm.   Pulmonary/Chest: Effort normal and breath sounds normal.  Abdominal: Soft. She exhibits no distension.  Neurological: She is alert and oriented to person, place, and time.  Psychiatric: She has a normal mood and affect.  Vitals reviewed.  Lab Results  Component Value Date   WBC 10.3 06/14/2015   HGB 15.1 (H) 06/14/2015   HCT 43.0 06/14/2015   PLT 412 (H) 06/14/2015   GLUCOSE 150 (H) 06/14/2015   CHOL 102 09/23/2013   TRIG 125.0 09/23/2013   HDL 41.60 09/23/2013   LDLCALC 35 09/23/2013   ALT 12 (L) 02/08/2015   AST 13 (L) 02/08/2015   NA 137 06/14/2015   K 3.8 06/14/2015   CL 102 06/14/2015   CREATININE 0.75 06/14/2015   BUN 13 06/14/2015   CO2 23 06/14/2015   TSH 1.888 02/08/2015   INR 1.1 08/25/2013   HGBA1C 5.3 02/08/2015   Assessment & Plan:   Problem List Items Addressed This Visit    RLS (restless legs syndrome)  Stable. Refilled gabapentin.      Insomnia    New problem. Sleep maintenance insomnia. Trial of Lunesta.      Hypertension    Patient has a history of hypertension. Her blood pressures have been well controlled following weight loss. She's recently regained some weight. BP elevated today on repeat. 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 If it is persistently elevated greater than 130/80 she will need to follow-up for discussion about treatment.      GERD (gastroesophageal reflux disease)    Stable. Refilled Protonix today.      Relevant Medications   pantoprazole (PROTONIX) 40 MG tablet       Meds ordered this encounter  Medications  . DISCONTD: pantoprazole (PROTONIX) 40 MG tablet    Sig: Take 40 mg by mouth daily.  Marland Kitchen DISCONTD: MELATONIN ER PO    Sig: Take by mouth.  . eszopiclone (LUNESTA) 1 MG TABS tablet    Sig: Take 1 tablet (1 mg total) by mouth at bedtime as needed for sleep. Take immediately before bedtime    Dispense:  90 tablet    Refill:  1  . gabapentin (NEURONTIN) 300 MG capsule    Sig: Take 1 capsule (300 mg total) by mouth at bedtime.    Dispense:  90 capsule    Refill:  1  . pantoprazole (PROTONIX) 40 MG tablet    Sig: Take 1 tablet (40 mg total) by mouth daily.    Dispense:  90 tablet    Refill:  1    Follow-up: 3-6 months  [place]

## 2016-02-18 NOTE — Patient Instructions (Signed)
Take the medication as needed.  Keep an eye on your BP. If consistently above 130/80, let me know.  Follow up in 3-6 months.  Take care  Dr. Lacinda Axon

## 2016-02-18 NOTE — Assessment & Plan Note (Signed)
Stable.  Refilled gabapentin. 

## 2016-02-18 NOTE — Assessment & Plan Note (Signed)
New problem. Sleep maintenance insomnia. Trial of Lunesta.

## 2016-02-18 NOTE — Progress Notes (Signed)
Pre visit review using our clinic review tool, if applicable. No additional management support is needed unless otherwise documented below in the visit note. 

## 2016-02-18 NOTE — Assessment & Plan Note (Signed)
Stable. Refilled Protonix today.

## 2016-02-23 DIAGNOSIS — F411 Generalized anxiety disorder: Secondary | ICD-10-CM | POA: Diagnosis not present

## 2016-02-23 DIAGNOSIS — Z01419 Encounter for gynecological examination (general) (routine) without abnormal findings: Secondary | ICD-10-CM | POA: Diagnosis not present

## 2016-02-23 DIAGNOSIS — Z789 Other specified health status: Secondary | ICD-10-CM | POA: Diagnosis not present

## 2016-03-09 LAB — HM PAP SMEAR: HM Pap smear: NORMAL

## 2016-04-26 DIAGNOSIS — L700 Acne vulgaris: Secondary | ICD-10-CM | POA: Diagnosis not present

## 2016-05-02 ENCOUNTER — Other Ambulatory Visit: Payer: Self-pay | Admitting: *Deleted

## 2016-05-02 DIAGNOSIS — Z1231 Encounter for screening mammogram for malignant neoplasm of breast: Secondary | ICD-10-CM

## 2016-06-06 ENCOUNTER — Ambulatory Visit: Payer: 59

## 2016-07-20 ENCOUNTER — Other Ambulatory Visit: Payer: Self-pay | Admitting: Family Medicine

## 2016-07-20 NOTE — Telephone Encounter (Signed)
Last OV was 02/18/16 and last refills were then also, please advise, thanks

## 2016-08-01 ENCOUNTER — Ambulatory Visit
Admission: RE | Admit: 2016-08-01 | Discharge: 2016-08-01 | Disposition: A | Discharge status: Home / Self Care | Payer: 59 | Source: Ambulatory Visit | Attending: *Deleted | Admitting: *Deleted

## 2016-08-01 DIAGNOSIS — Z1231 Encounter for screening mammogram for malignant neoplasm of breast: Secondary | ICD-10-CM | POA: Diagnosis not present

## 2016-08-23 ENCOUNTER — Other Ambulatory Visit: Payer: Self-pay | Admitting: Family Medicine

## 2016-08-23 MED FILL — GABAPENTIN 300 MG CAPSULE: 300 | 90 days supply | Qty: 90 | Fill #0

## 2016-08-28 MED FILL — ALPRAZolam 0.25 MG TABS: 0.25 | 30 days supply | Qty: 30 | Fill #0

## 2016-08-28 MED FILL — CLIND PH-BENZOYL PEROX 1.2-: 1.2-5 | 30 days supply | Qty: 45 | Fill #0

## 2016-08-28 MED FILL — TRETINOIN 0.05% CREAM: 0.05 | 30 days supply | Qty: 20 | Fill #0

## 2016-09-25 MED FILL — ALPRAZolam 0.25 MG TABS: 0.25 | 30 days supply | Qty: 30 | Fill #1

## 2016-10-20 MED FILL — PANTOPRAZOLE SOD DR 40 MG T: 40 | 90 days supply | Qty: 90 | Fill #0

## 2016-10-24 MED FILL — CLIND PH-BENZOYL PEROX 1.2-: 1.2-5 | 30 days supply | Qty: 45 | Fill #1

## 2016-10-24 MED FILL — ALPRAZolam 0.25 MG TABS: 0.25 | 30 days supply | Qty: 30 | Fill #0

## 2016-11-20 MED FILL — GABAPENTIN 300 MG CAPSULE: 300 | 90 days supply | Qty: 90 | Fill #1

## 2016-11-21 MED FILL — ALPRAZolam 0.25 MG TABS: 0.25 | 30 days supply | Qty: 30 | Fill #1

## 2016-12-22 MED FILL — ALPRAZolam 0.25 MG TABS: 0.25 | 30 days supply | Qty: 30 | Fill #2

## 2016-12-22 MED FILL — TRETINOIN 0.05% CREAM: 0.05 | 30 days supply | Qty: 20 | Fill #1

## 2016-12-22 MED FILL — CLIND PH-BENZOYL PEROX 1.2-: 1.2-5 | 30 days supply | Qty: 45 | Fill #2

## 2017-01-18 ENCOUNTER — Other Ambulatory Visit: Payer: Self-pay | Admitting: Family Medicine

## 2017-01-18 MED FILL — PANTOPRAZOLE SOD DR 40 MG T: 40 | 90 days supply | Qty: 90 | Fill #0

## 2017-01-19 MED FILL — ALPRAZolam 0.25 MG TABS: 0.25 | 30 days supply | Qty: 30 | Fill #3

## 2017-01-26 DIAGNOSIS — H5213 Myopia, bilateral: Secondary | ICD-10-CM | POA: Diagnosis not present

## 2017-01-26 DIAGNOSIS — H04001 Unspecified dacryoadenitis, right lacrimal gland: Secondary | ICD-10-CM | POA: Diagnosis not present

## 2017-02-15 ENCOUNTER — Ambulatory Visit (INDEPENDENT_AMBULATORY_CARE_PROVIDER_SITE_OTHER): Payer: 59 | Admitting: Internal Medicine

## 2017-02-15 ENCOUNTER — Encounter: Payer: Self-pay | Admitting: Internal Medicine

## 2017-02-15 VITALS — BP 124/90 | HR 106 | Temp 97.9°F | Resp 16 | Ht 62.5 in | Wt 185.0 lb

## 2017-02-15 DIAGNOSIS — F419 Anxiety disorder, unspecified: Secondary | ICD-10-CM

## 2017-02-15 DIAGNOSIS — F320 Major depressive disorder, single episode, mild: Secondary | ICD-10-CM | POA: Diagnosis not present

## 2017-02-15 DIAGNOSIS — R011 Cardiac murmur, unspecified: Secondary | ICD-10-CM

## 2017-02-15 DIAGNOSIS — F339 Major depressive disorder, recurrent, unspecified: Secondary | ICD-10-CM | POA: Insufficient documentation

## 2017-02-15 DIAGNOSIS — Z87898 Personal history of other specified conditions: Secondary | ICD-10-CM

## 2017-02-15 DIAGNOSIS — G4700 Insomnia, unspecified: Secondary | ICD-10-CM | POA: Diagnosis not present

## 2017-02-15 MED ORDER — SERTRALINE HCL 50 MG PO TABS: 50.0000 mg | ORAL_TABLET | Freq: Every day | ORAL | 1 refills | Status: DC

## 2017-02-15 MED FILL — SERTRALINE HCL 50 MG TABLET: 50 | 30 days supply | Qty: 30 | Fill #0

## 2017-02-15 NOTE — Patient Instructions (Addendum)
Please follow up in 6 weeks  Happy Holidays    Sertraline tablets What is this medicine? SERTRALINE (SER tra leen) is used to treat depression. It may also be used to treat obsessive compulsive disorder, panic disorder, post-trauma stress, premenstrual dysphoric disorder (PMDD) or social anxiety. This medicine may be used for other purposes; ask your health care provider or pharmacist if you have questions. COMMON BRAND NAME(S): Zoloft What should I tell my health care provider before I take this medicine? They need to know if you have any of these conditions: -bleeding disorders -bipolar disorder or a family history of bipolar disorder -glaucoma -heart disease -high blood pressure -history of irregular heartbeat -history of low levels of calcium, magnesium, or potassium in the blood -if you often drink alcohol -liver disease -receiving electroconvulsive therapy -seizures -suicidal thoughts, plans, or attempt; a previous suicide attempt by you or a family member -take medicines that treat or prevent blood clots -thyroid disease -an unusual or allergic reaction to sertraline, other medicines, foods, dyes, or preservatives -pregnant or trying to get pregnant -breast-feeding How should I use this medicine? Take this medicine by mouth with a glass of water. Follow the directions on the prescription label. You can take it with or without food. Take your medicine at regular intervals. Do not take your medicine more often than directed. Do not stop taking this medicine suddenly except upon the advice of your doctor. Stopping this medicine too quickly may cause serious side effects or your condition may worsen. A special MedGuide will be given to you by the pharmacist with each prescription and refill. Be sure to read this information carefully each time. Talk to your pediatrician regarding the use of this medicine in children. While this drug may be prescribed for children as young as 7 years  for selected conditions, precautions do apply. Overdosage: If you think you have taken too much of this medicine contact a poison control center or emergency room at once. NOTE: This medicine is only for you. Do not share this medicine with others. What if I miss a dose? If you miss a dose, take it as soon as you can. If it is almost time for your next dose, take only that dose. Do not take double or extra doses. What may interact with this medicine? Do not take this medicine with any of the following medications: -cisapride -dofetilide -dronedarone -linezolid -MAOIs like Carbex, Eldepryl, Marplan, Nardil, and Parnate -methylene blue (injected into a vein) -pimozide -thioridazine This medicine may also interact with the following medications: -alcohol -amphetamines -aspirin and aspirin-like medicines -certain medicines for depression, anxiety, or psychotic disturbances -certain medicines for fungal infections like ketoconazole, fluconazole, posaconazole, and itraconazole -certain medicines for irregular heart beat like flecainide, quinidine, propafenone -certain medicines for migraine headaches like almotriptan, eletriptan, frovatriptan, naratriptan, rizatriptan, sumatriptan, zolmitriptan -certain medicines for sleep -certain medicines for seizures like carbamazepine, valproic acid, phenytoin -certain medicines that treat or prevent blood clots like warfarin, enoxaparin, dalteparin -cimetidine -digoxin -diuretics -fentanyl -isoniazid -lithium -NSAIDs, medicines for pain and inflammation, like ibuprofen or naproxen -other medicines that prolong the QT interval (cause an abnormal heart rhythm) -rasagiline -safinamide -supplements like St. John's wort, kava kava, valerian -tolbutamide -tramadol -tryptophan This list may not describe all possible interactions. Give your health care provider a list of all the medicines, herbs, non-prescription drugs, or dietary supplements you use.  Also tell them if you smoke, drink alcohol, or use illegal drugs. Some items may interact with your medicine. What should I  watch for while using this medicine? Tell your doctor if your symptoms do not get better or if they get worse. Visit your doctor or health care professional for regular checks on your progress. Because it may take several weeks to see the full effects of this medicine, it is important to continue your treatment as prescribed by your doctor. Patients and their families should watch out for new or worsening thoughts of suicide or depression. Also watch out for sudden changes in feelings such as feeling anxious, agitated, panicky, irritable, hostile, aggressive, impulsive, severely restless, overly excited and hyperactive, or not being able to sleep. If this happens, especially at the beginning of treatment or after a change in dose, call your health care professional. Dennis Bast may get drowsy or dizzy. Do not drive, use machinery, or do anything that needs mental alertness until you know how this medicine affects you. Do not stand or sit up quickly, especially if you are an older patient. This reduces the risk of dizzy or fainting spells. Alcohol may interfere with the effect of this medicine. Avoid alcoholic drinks. Your mouth may get dry. Chewing sugarless gum or sucking hard candy, and drinking plenty of water may help. Contact your doctor if the problem does not go away or is severe. What side effects may I notice from receiving this medicine? Side effects that you should report to your doctor or health care professional as soon as possible: -allergic reactions like skin rash, itching or hives, swelling of the face, lips, or tongue -anxious -black, tarry stools -changes in vision -confusion -elevated mood, decreased need for sleep, racing thoughts, impulsive behavior -eye pain -fast, irregular heartbeat -feeling faint or lightheaded, falls -feeling agitated, angry, or  irritable -hallucination, loss of contact with reality -loss of balance or coordination -loss of memory -painful or prolonged erections -restlessness, pacing, inability to keep still -seizures -stiff muscles -suicidal thoughts or other mood changes -trouble sleeping -unusual bleeding or bruising -unusually weak or tired -vomiting Side effects that usually do not require medical attention (report to your doctor or health care professional if they continue or are bothersome): -change in appetite or weight -change in sex drive or performance -diarrhea -increased sweating -indigestion, nausea -tremors This list may not describe all possible side effects. Call your doctor for medical advice about side effects. You may report side effects to FDA at 1-800-FDA-1088. Where should I keep my medicine? Keep out of the reach of children. Store at room temperature between 15 and 30 degrees C (59 and 86 degrees F). Throw away any unused medicine after the expiration date. NOTE: This sheet is a summary. It may not cover all possible information. If you have questions about this medicine, talk to your doctor, pharmacist, or health care provider.  2018 Elsevier/Gold Standard (2016-03-03 14:17:49)  Major Depressive Disorder, Adult Major depressive disorder (MDD) is a mental health condition. MDD often makes you feel sad, hopeless, or helpless. MDD can also cause symptoms in your body. MDD can affect your:  Work.  School.  Relationships.  Other normal activities.  MDD can range from mild to very bad. It may occur once (single episode MDD). It can also occur many times (recurrent MDD). The main symptoms of MDD often include:  Feeling sad, depressed, or irritable most of the time.  Loss of interest.  MDD symptoms also include:  Sleeping too much or too little.  Eating too much or too little.  A change in your weight.  Feeling tired (fatigue) or having low  energy.  Feeling  worthless.  Feeling guilty.  Trouble making decisions.  Trouble thinking clearly.  Thoughts of suicide or harming others.  Feeling weak.  Feeling agitated.  Keeping yourself from being around other people (isolation).  Follow these instructions at home: Activity  Do these things as told by your doctor: ? Go back to your normal activities. ? Exercise regularly. ? Spend time outdoors. Alcohol  Talk with your doctor about how alcohol can affect your antidepressant medicines.  Do not drink alcohol. Or, limit how much alcohol you drink. ? This means no more than 1 drink a day for nonpregnant women and 2 drinks a day for men. One drink equals one of these:  12 oz of beer.  5 oz of wine.  1 oz of hard liquor. General instructions  Take over-the-counter and prescription medicines only as told by your doctor.  Eat a healthy diet.  Get plenty of sleep.  Find activities that you enjoy. Make time to do them.  Think about joining a support group. Your doctor may be able to suggest a group for you.  Keep all follow-up visits as told by your doctor. This is important. Where to find more information:  [employer] on Mental Illness: ? www.nami.[place]: ? https://carter.com/  National Suicide Prevention Lifeline: ? (325)387-3290. This is free, 24-hour help. Contact a doctor if:  Your symptoms get worse.  You have new symptoms. Get help right away if:  You self-harm.  You see, hear, taste, smell, or feel things that are not present (hallucinate). If you ever feel like you may hurt yourself or others, or have thoughts about taking your own life, get help right away. You can go to your nearest emergency department or call:  Your local emergency services (911 in the [place]  A suicide crisis helpline, such as the National Suicide Prevention Lifeline: ? 680 570 7370. This is open 24 hours a day.  This information is not  intended to replace advice given to you by your health care provider. Make sure you discuss any questions you have with your health care provider. Document Released: 02/08/2015 Document Revised: 11/14/2015 Document Reviewed: 11/14/2015 Elsevier Interactive Patient Education  2017 [employer].  Temazepam tablets or capsules What is this medicine? TEMAZEPAM (te MAZ e pam) is a benzodiazepine. It is used to help you to fall asleep and sleep through the night. This medicine may be used for other purposes; ask your health care provider or pharmacist if you have questions. COMMON BRAND NAME(S): Restoril What should I tell my health care provider before I take this medicine? They need to know if you have any of these conditions: -an alcohol or drug abuse problem -bipolar disorder, depression, psychosis or other mental health condition -kidney disease -liver disease -lung or breathing disease -myasthenia gravis -Parkinson's disease -porphyria -seizures or a history of seizures -suicidal thoughts -an unusual or allergic reaction to temazepam, other benzodiazepines, other medicines, foods, dyes, or preservatives -pregnant or trying to get pregnant -breast-feeding How should I use this medicine? Take this medicine by mouth. It is only for use at bedtime. Follow the directions on the prescription label. Swallow the tablets or capsules with a drink of water. If it upsets your stomach, take it with food or milk. Do not take your medicine more often than directed. Do not stop taking except on the advice of your doctor or health care professional. A special MedGuide will be given to you by the pharmacist  with each prescription and refill. Be sure to read this information carefully each time. Talk to your pediatrician regarding the use of this medicine in children. Special care may be needed. Overdosage: If you think you have taken too much of this medicine contact a poison control center or emergency  room at once. NOTE: This medicine is only for you. Do not share this medicine with others. What if I miss a dose? If you miss a dose, take it as soon as you can. If it is almost time for your next dose, take only that dose. Do not take double or extra doses. What may interact with this medicine? Do not take this medicine with any of the following medications: -narcotic medicines for cough -sodium oxybate This medicine may also interact with the following medications: -alcohol -antihistamines for allergy, cough and cold -certain medicines for anxiety or sleep -certain medicines for depression, like amitriptyline, fluoxetine, sertraline -certain medicines for seizures like phenobarbital, primidone -general anesthetics like lidocaine, pramoxine, tetracaine -medicines that relax muscles for surgery -narcotic medicines for pain -phenothiazines like chlorpromazine, mesoridazine, prochlorperazine, thioridazine This list may not describe all possible interactions. Give your health care provider a list of all the medicines, herbs, non-prescription drugs, or dietary supplements you use. Also tell them if you smoke, drink alcohol, or use illegal drugs. Some items may interact with your medicine. What should I watch for while using this medicine? Tell your doctor or health care professional if your symptoms do not start to get better or if they get worse. Do not stop taking except on your doctor's advice. You may develop a severe reaction. Your doctor will tell you how much medicine to take. You may get drowsy or dizzy. Do not drive, use machinery, or do anything that needs mental alertness until you know how this medicine affects you. Do not stand or sit up quickly, especially if you are an older patient. This reduces the risk of dizzy or fainting spells. Alcohol may interfere with the effect of this medicine. Avoid alcoholic drinks. If you are taking another medicine that also causes drowsiness, you may  have more side effects. Give your health care provider a list of all medicines you use. Your doctor will tell you how much medicine to take. Do not take more medicine than directed. Call emergency for help if you have problems breathing or unusual sleepiness. After taking this medicine for sleep, you may get up out of bed while not being fully awake and do an activity that you do not know you are doing. The next morning, you may have no memory of the event. Activities such as driving a car ("sleep-driving"), making and eating food, talking on the phone, sexual activity, and sleep-walking have been reported. Call your doctor right away if you find out you have done any of these activities. Do not take this medicine if you have used alcohol that evening or before bed or taken another medicine for sleep since your risk of doing these sleep-related activities will be increased. Do not take this medicine unless you are able to stay in bed for a full night (7 to 8 hour) before you must be active again. You may have a decrease in mental alertness the day after use, even if you feel that you are fully awake. Tell your doctor if you will need to perform activities requiring full alertness, such as driving, the next day. Do not stand or sit up quickly after taking this medicine, especially if you  are an older patient. This reduces the risk of dizzy or fainting spells. If you or your family notice any changes in your behavior, such as new or worsening depression, thoughts of harming yourself, anxiety, other unusual or disturbing thoughts, or memory loss, call your doctor right away. After you stop taking this medicine, you may have trouble falling asleep. This is called rebound insomnia. This problem usually goes away on its own after 1 or 2 nights. Women should inform their doctor if they wish to become pregnant or think they might be pregnant. There is a potential for serious side effects to an unborn child. Talk to  your health care professional or pharmacist for more information. What side effects may I notice from receiving this medicine? Side effects that you should report to your doctor or health care professional as soon as possible: -allergic reactions like skin rash, itching or hives, swelling of the face, lips, or tongue -breathing problems -confusion -loss of balance or coordination -signs and symptoms of low blood pressure like dizziness; feeling faint or lightheaded, falls; unusually weak or tired -suicidal thoughts or other mood changes -unusual activities while asleep like driving, eating, making phone calls Side effects that usually do not require medical attention (report to your doctor or health care professional if they continue or are bothersome): -diarrhea -dizziness -nausea, vomiting -tiredness This list may not describe all possible side effects. Call your doctor for medical advice about side effects. You may report side effects to FDA at 1-800-FDA-1088. Where should I keep my medicine? Keep out of the reach of children. This medicine can be abused. Keep your medicine in a safe place to protect it from theft. Do not share this medicine with anyone. Selling or giving away this medicine is dangerous and against the law. This medicine may cause accidental overdose and death if taken by other adults, children, or pets. Mix any unused medicine with a substance like cat litter or coffee grounds. Then throw the medicine away in a sealed container like a sealed bag or a coffee can with a lid. Do not use the medicine after the expiration date. Store at room temperature below 30 degrees C (86 degrees F). Protect from light. Keep container tightly closed. NOTE: This sheet is a summary. It may not cover all possible information. If you have questions about this medicine, talk to your doctor, pharmacist, or health care provider.  2018 Elsevier/Gold Standard (2014-11-26 23:44:07)

## 2017-02-15 NOTE — Progress Notes (Signed)
Chief Complaint  Patient presents with  . Establish Care   Establish with Dr. Gayland Curry 1. She reports has 41 y.o, 41 y.o, 41 y.o and married since 69 and always a mom now kids are growing to adulthood she is trying to find her place. This cause a bit of anxiety and stress and at times she feels lack of motivation PHQ 9 score 11 today. For anxiety she takes Xanax 0.25 mostly 1x per day before work sometimes at most 2x per day and is getting from OB/GYN  2. She reports h/o muscle aches and has been told she had fibromyalgia. She also has cramps at times in calves and neck and low back pain x years.  She reports in the past x 3 years she was tx'ed for Rheumatoid arthritis and has FH in [place], mom but when her old rheumatologist Dr. Deliah Goody in [place] wanted to treat her with infusions of medications she got 2nd opinion from Dr. Charlestine Night who stated she did not have RA  3. She reports h/o h/a and weakness and abnormal lfts with h/o tick bite and prev. Saw neurologist. 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 She also reports h/o syncope and SVT. Last episode SVT 2 year ago and ? Etiology she has seen multiple cardiologist with no cause foung    Review of Systems  Constitutional: Negative for weight loss.  HENT: Negative for hearing loss.   Eyes:       No vision problems  Respiratory: Negative for shortness of breath.   Cardiovascular: Negative for chest pain and palpitations.  Gastrointestinal: Positive for abdominal pain and diarrhea.       +chronic generalized ab pain   Musculoskeletal: Positive for myalgias.  Skin: Negative for rash.  Neurological: Positive for sensory change and headaches.  Psychiatric/Behavioral: The patient is nervous/anxious.        +stress    Past Medical History:  Diagnosis Date  . Anemia   .  Anxiety   . Fibromyalgia   . Frequent headaches    Followed by Neurology - in [place] - stress  . GERD (gastroesophageal reflux disease)   . Heart murmur    Seen by Dr. Fletcher Anon - no murmur  . Hypertension 3 years ago  . Rheumatoid arthritis ([place]) From [facility]   Remission 2 years  . [facility] spotted fever 5 years ago  . Sleep apnea    RESOLVED SINCE GASTRIC BYPASS SURGERY IN 2015  . SVT (supraventricular tachycardia) ([place]) Noticed 3 years ago   Followed by Dr. Fletcher Anon  . Syncope and collapse 3 years ago - Happened about 2 times - Last time    Seen by neuro and cardiology - increased heart rate related   Past Surgical History:  Procedure Laterality Date  . APPENDECTOMY  1984  . BREAST REDUCTION SURGERY  2003  . CHOLECYSTECTOMY N/A 09/01/2014   Procedure: LAPAROSCOPIC CHOLECYSTECTOMY;  Surgeon: Bonner Puna, MD;  Location: [facility] ORS;  Service: General;  Laterality: N/A;  . ENDOMETRIAL ABLATION  2010? Pt unsure  . LAPAROSCOPIC GASTRIC SLEEVE RESECTION    . REDUCTION MAMMAPLASTY Bilateral 2003  . TUBAL LIGATION  2001   Family History  Problem Relation Age of Onset  . Heart disease Mother   . Heart attack  Mother   . Stroke Mother   . Hypertension Mother   . Hyperlipidemia Mother   . Arthritis Mother        Rheumatoid arthritis  . Kidney disease Mother   . Depression Mother   . Diabetes Mother   . Heart disease Father   . Heart attack Father   . Hypertension Father   . Hyperlipidemia Father   . Kidney disease Father   . Diabetes Father   . COPD Father   . Cancer Maternal Grandmother        Breast cancer  . Heart attack Maternal Grandmother   . Breast cancer Maternal Grandmother   . Arthritis Maternal Grandfather        rheumatoid arthritis  . Diabetes Maternal Grandfather    Social History   Socioeconomic History  . Marital status: Married    Spouse name: Donnie  . Number of children: 3  . Years of education: 13  . Highest education level: Not on file  Social  Needs  . Financial resource strain: Not on file  . Food insecurity - worry: Not on file  . Food insecurity - inability: Not on file  . Transportation needs - medical: Not on file  . Transportation needs - non-medical: Not on file  Occupational History  . Occupation: Respiratory Therapist    Employer: [employer]  Tobacco Use  . Smoking status: Never Smoker  . Smokeless tobacco: Never Used  Substance and Sexual Activity  . Alcohol use: No  . Drug use: No  . Sexual activity: Yes    Birth control/protection: Other-see comments    Comment: Ablation  Other Topics Concern  . Not on file  Social History Narrative   Born in raised in [place], [place]. Currently resides in [place], [place]. Lives with husband, Letitia Libra, and 3 children. 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 Enjoys camping, shopping, reading, and involved in church. Likes to ride motorcycle with husband and wears helmet.    Current Meds  Medication Sig  . ALPRAZolam (XANAX) 0.25 MG tablet Take 0.25 mg by mouth daily as needed for anxiety.   . B Complex Vitamins (B COMPLEX-B12 PO) Take 1 tablet by mouth daily.   Marland Kitchen BIOTIN 5000 PO Take 1 tablet by mouth daily.   . calcium-vitamin D (OSCAL WITH D) 500-200 MG-UNIT per tablet Take 1 tablet by mouth daily.  Marland Kitchen gabapentin (NEURONTIN) 300 MG capsule TAKE 1 CAPSULE BY MOUTH AT BEDTIME.  . Multiple Vitamin (MULTIVITAMIN) tablet Take 1 tablet by mouth daily.  . pantoprazole (PROTONIX) 40 MG tablet TAKE 1 TABLET BY MOUTH ONCE DAILY.   Allergies  Allergen Reactions  . Amoxicillin Hives  . Enbrel [Etanercept] Hives and Rash   No results found for this or any previous visit (from the past 2160 hour(s)). Objective  Body mass index is 33.3 kg/m. Wt Readings from Last 3 Encounters:  02/15/17 185 lb (83.9 kg)  02/18/16 173 lb 8 oz (78.7 kg)  08/25/14 143 lb (64.9 kg)   Temp Readings from Last 3 Encounters:  02/15/17 97.9 F (36.6 C) (Oral)  02/18/16  98.3 F (36.8 C) (Oral)  09/01/14 97.2 F (36.2 C) (Tympanic)   BP Readings from Last 3 Encounters:  02/15/17 124/90  02/18/16 (!) 148/75  06/14/15 100/82   Pulse Readings from Last 3 Encounters:  02/15/17 (!) 106  02/18/16 76  06/14/15 77   Pulse oximetry on room air is 98% Physical Exam  Constitutional: She is oriented  to person, place, and time and well-developed, well-nourished, and in no distress. Vital signs are normal.  HENT:  Head: Normocephalic and atraumatic.  Mouth/Throat: Oropharynx is clear and moist and mucous membranes are normal.  Eyes: Conjunctivae are normal. Pupils are equal, round, and reactive to light.  Cardiovascular: Normal rate and regular rhythm.  Murmur heard. Neg leg edema b/l   Pulmonary/Chest: Effort normal and breath sounds normal.  Abdominal: Soft. Bowel sounds are normal. There is generalized tenderness.  Neurological: She is alert and oriented to person, place, and time. Gait normal.  Skin: Skin is warm, dry and intact.  Psychiatric: Mood, memory, affect and judgment normal.  Nursing note and vitals reviewed.  Assessment   1. Anxiety/depression PHQ 9 score 11/insomnia  2. ? H/o  RA, ? H/o fibromyalgia per pt 3. H/o SVT (last time 2 years ago), cardiac murmur, h/o syncope 4. HM   Plan  1.  Add Zoloft 50 mg  Prn Xanax 0.25 1-2 x per day getting from  OB/GYN. Advised can take for sleep prn goal try to wean off and if sleep maintenance a problem disc Temazepam for sleep but will have to d/c Xanax  Lunesta is no longer taking and Melatonin was taking 30-40 mg qhs and not helping. Even tried Benadryl, Unisom w/o benadryl for sleep  F/u in 6 weeks   2. Get records from Dr. Fulton Reek and Dr. Charlestine Night (rheumatology)    3.  Consider EP consult in future if happens again  She has prev. Been to multiple cardiologists  4.  Had flu shot 11/2016  Will check on year of Tdap  Declines HPV vaccine  Check Hep B status   Pap appt 02/2017  and get records [facility]. H/o neg HPV but abnl pap resolved in 1991 when off OCP  mammo neg 08/01/16 due 08/01/17   Given [facility] form to check CMET, CBC, A1C, vit D, lipid, TSH, T4, UA, hep B sab quant  rec exercise  Provider: Dr. Olivia Mackie McLean-Scocuzza

## 2017-02-19 MED FILL — CLIND PH-BENZOYL PEROX 1.2-: 1.2-5 | 30 days supply | Qty: 45 | Fill #3

## 2017-02-21 ENCOUNTER — Other Ambulatory Visit: Payer: Self-pay | Admitting: Family Medicine

## 2017-02-21 ENCOUNTER — Other Ambulatory Visit (HOSPITAL_COMMUNITY)
Admission: RE | Admit: 2017-02-21 | Discharge: 2017-02-21 | Disposition: A | Discharge status: Home / Self Care | Payer: 59 | Source: Ambulatory Visit | Attending: Internal Medicine | Admitting: Internal Medicine

## 2017-02-21 DIAGNOSIS — I10 Essential (primary) hypertension: Secondary | ICD-10-CM | POA: Insufficient documentation

## 2017-02-21 DIAGNOSIS — Z1329 Encounter for screening for other suspected endocrine disorder: Secondary | ICD-10-CM | POA: Insufficient documentation

## 2017-02-21 DIAGNOSIS — Z1159 Encounter for screening for other viral diseases: Secondary | ICD-10-CM | POA: Diagnosis not present

## 2017-02-21 DIAGNOSIS — E559 Vitamin D deficiency, unspecified: Secondary | ICD-10-CM | POA: Insufficient documentation

## 2017-02-21 LAB — CBC WITH DIFFERENTIAL/PLATELET
BASOS PCT: 0 %
Basophils Absolute: 0 10*3/uL (ref 0.0–0.1)
EOS ABS: 0.1 10*3/uL (ref 0.0–0.7)
Eosinophils Relative: 2 %
HCT: 36.8 % (ref 36.0–46.0)
HEMOGLOBIN: 12.1 g/dL (ref 12.0–15.0)
Lymphocytes Relative: 41 %
Lymphs Abs: 2.7 10*3/uL (ref 0.7–4.0)
MCH: 28.6 pg (ref 26.0–34.0)
MCHC: 32.9 g/dL (ref 30.0–36.0)
MCV: 87 fL (ref 78.0–100.0)
Monocytes Absolute: 0.4 10*3/uL (ref 0.1–1.0)
Monocytes Relative: 6 %
NEUTROS ABS: 3.4 10*3/uL (ref 1.7–7.7)
Neutrophils Relative %: 51 %
PLATELETS: 304 10*3/uL (ref 150–400)
RBC: 4.23 MIL/uL (ref 3.87–5.11)
RDW: 12.6 % (ref 11.5–15.5)
WBC: 6.6 10*3/uL (ref 4.0–10.5)

## 2017-02-21 LAB — URINALYSIS, ROUTINE W REFLEX MICROSCOPIC
BILIRUBIN URINE: NEGATIVE
GLUCOSE, UA: NEGATIVE mg/dL
HGB URINE DIPSTICK: NEGATIVE
KETONES UR: NEGATIVE mg/dL
Leukocytes, UA: NEGATIVE
Nitrite: NEGATIVE
PROTEIN: NEGATIVE mg/dL
Specific Gravity, Urine: 1.008 (ref 1.005–1.030)
pH: 6 (ref 5.0–8.0)

## 2017-02-21 LAB — COMPREHENSIVE METABOLIC PANEL
ALT: 16 U/L (ref 14–54)
ANION GAP: 8 (ref 5–15)
AST: 16 U/L (ref 15–41)
Albumin: 4.1 g/dL (ref 3.5–5.0)
Alkaline Phosphatase: 59 U/L (ref 38–126)
BUN: 17 mg/dL (ref 6–20)
CHLORIDE: 104 mmol/L (ref 101–111)
CO2: 26 mmol/L (ref 22–32)
Calcium: 9 mg/dL (ref 8.9–10.3)
Creatinine, Ser: 0.86 mg/dL (ref 0.44–1.00)
Glucose, Bld: 94 mg/dL (ref 65–99)
POTASSIUM: 3.6 mmol/L (ref 3.5–5.1)
SODIUM: 138 mmol/L (ref 135–145)
Total Bilirubin: 0.7 mg/dL (ref 0.3–1.2)
Total Protein: 7.3 g/dL (ref 6.5–8.1)

## 2017-02-21 LAB — LIPID PANEL
CHOLESTEROL: 131 mg/dL (ref 0–200)
HDL: 46 mg/dL (ref >40)
LDL CALC: 70 mg/dL (ref 0–99)
TRIGLYCERIDES: 76 mg/dL (ref <150)
Total CHOL/HDL Ratio: 2.8 RATIO
VLDL: 15 mg/dL (ref 0–40)

## 2017-02-21 LAB — HEMOGLOBIN A1C
HEMOGLOBIN A1C: 5.2 % (ref 4.8–5.6)
MEAN PLASMA GLUCOSE: 102.54 mg/dL

## 2017-02-21 LAB — TSH: TSH: 1.777 u[IU]/mL (ref 0.350–4.500)

## 2017-02-21 LAB — T4, FREE: Free T4: 0.96 ng/dL (ref 0.61–1.12)

## 2017-02-22 LAB — VITAMIN D 25 HYDROXY (VIT D DEFICIENCY, FRACTURES): Vit D, 25-Hydroxy: 42.2 ng/mL (ref 30.0–100.0)

## 2017-02-22 LAB — HEPATITIS B SURFACE ANTIBODY, QUANTITATIVE

## 2017-02-23 MED FILL — GABAPENTIN 300 MG CAPSULE: 300 | 90 days supply | Qty: 90 | Fill #0

## 2017-02-23 MED FILL — ALPRAZolam 0.25 MG TABS: 0.25 | 30 days supply | Qty: 30 | Fill #0

## 2017-02-23 NOTE — Telephone Encounter (Signed)
Copied from [place]. Topic: Quick Communication - See Telephone Encounter >> Feb 23, 2017  7:39 AM Synthia Innocent wrote: CRM for notification. See Telephone encounter for:  Requesting refill on gabapentin (NEURONTIN) 300 MG capsule, she has contacted the pharmacy, she has no refills left. [place]

## 2017-02-23 NOTE — Telephone Encounter (Signed)
Last refill 08/23/16 for 90 capsules and 1 refill. Has upcoming appt 03/30/17 given 90 day supply no refill

## 2017-03-12 MED FILL — SERTRALINE HCL 50 MG TABLET: 50 | 30 days supply | Qty: 30 | Fill #1

## 2017-03-22 ENCOUNTER — Other Ambulatory Visit: Payer: Self-pay | Admitting: Internal Medicine

## 2017-03-22 DIAGNOSIS — F411 Generalized anxiety disorder: Secondary | ICD-10-CM | POA: Diagnosis not present

## 2017-03-22 DIAGNOSIS — R1032 Left lower quadrant pain: Secondary | ICD-10-CM | POA: Diagnosis not present

## 2017-03-22 DIAGNOSIS — Z01419 Encounter for gynecological examination (general) (routine) without abnormal findings: Secondary | ICD-10-CM | POA: Diagnosis not present

## 2017-03-22 DIAGNOSIS — Z8041 Family history of malignant neoplasm of ovary: Secondary | ICD-10-CM | POA: Diagnosis not present

## 2017-03-22 NOTE — Telephone Encounter (Signed)
Copied from [place] [id number]. Topic: Quick Communication - Rx Refill/Question >> Mar 22, 2017 11:35 AM [name] B wrote: Medication: ALPRAZolam [name] 0.25 MG tablet [[id number]]  Pt told by McLean-Scocuzza to call in if she needed her to fill this medication, have the med sent to the [facility] outpatient pharmacy, contact pt if needed

## 2017-03-23 ENCOUNTER — Telehealth: Payer: Self-pay

## 2017-03-23 MED ORDER — ALPRAZOLAM 0.25 MG PO TABS: 0.2500 mg | ORAL_TABLET | Freq: Every day | ORAL | 1 refills | Status: DC | PRN

## 2017-03-23 MED FILL — ALPRAZolam 0.25 MG TABS: 0.25 | 30 days supply | Qty: 30 | Fill #0

## 2017-03-23 NOTE — Telephone Encounter (Signed)
Last office visit 02/15/17 Next office visit 03/30/17

## 2017-03-23 NOTE — Telephone Encounter (Signed)
Copied from [place] [id number]. Topic: Quick Communication - Office Called Patient >> Mar 23, 2017  1:53 PM Johna Sheriff, CMA wrote: Reason for CRM: left message to call  >> Mar 23, 2017  2:16 PM [name] B wrote: Maudie Mercury is calling Kristen back after being notified to call

## 2017-03-30 ENCOUNTER — Encounter: Payer: Self-pay | Admitting: Internal Medicine

## 2017-03-30 ENCOUNTER — Ambulatory Visit (INDEPENDENT_AMBULATORY_CARE_PROVIDER_SITE_OTHER): Payer: 59 | Admitting: Internal Medicine

## 2017-03-30 VITALS — BP 114/78 | HR 96 | Temp 98.4°F | Resp 14 | Ht 62.5 in | Wt 183.0 lb

## 2017-03-30 DIAGNOSIS — K219 Gastro-esophageal reflux disease without esophagitis: Secondary | ICD-10-CM | POA: Diagnosis not present

## 2017-03-30 DIAGNOSIS — G8929 Other chronic pain: Secondary | ICD-10-CM | POA: Diagnosis not present

## 2017-03-30 DIAGNOSIS — F320 Major depressive disorder, single episode, mild: Secondary | ICD-10-CM

## 2017-03-30 DIAGNOSIS — I471 Supraventricular tachycardia: Secondary | ICD-10-CM | POA: Diagnosis not present

## 2017-03-30 DIAGNOSIS — R109 Unspecified abdominal pain: Secondary | ICD-10-CM | POA: Diagnosis not present

## 2017-03-30 DIAGNOSIS — F419 Anxiety disorder, unspecified: Secondary | ICD-10-CM | POA: Diagnosis not present

## 2017-03-30 DIAGNOSIS — R102 Pelvic and perineal pain: Secondary | ICD-10-CM | POA: Diagnosis not present

## 2017-03-30 MED ORDER — SERTRALINE HCL 50 MG PO TABS: 50.0000 mg | ORAL_TABLET | Freq: Every day | ORAL | 5 refills | Status: DC

## 2017-03-30 NOTE — Patient Instructions (Addendum)
F/u in 3-4 months  Will refer you to Dr. Rayann Heman or [facility]  Will refer you to [facility] please get your records from [place]  I am happy you are better  Call back when ready for OB/GYN referral  Check on Tdap vaccine year given please   Take care    Supraventricular Tachycardia, Adult Supraventricular tachycardia (SVT) is a kind of abnormal heartbeat. It makes your heart beat very fast and then beat at a normal speed. A normal heart beats 60-100 times a minute. This condition can make your heart beat more than 150 times a minute. Times of having a fast heartbeat (episodes) can be scary, but they are usually not dangerous. They can lead to problems if:  They happen often.  They last a long time.  Symptoms of this condition include:  A pounding heart.  A feeling that your heart is skipping beats (palpitations).  Weakness.  Trouble getting enough air (shortness of breath).  Pain or tightness in your chest.  Feeling like you are going to pass out (light-headedness).  Feeling worried or nervous (anxiety).  Dizziness.  Sweating.  Feeling sick to your stomach (nausea).  Passing out (fainting).  Tiredness.  Sometimes, there are no symptoms. Follow these instructions at home: Stress  Avoid things that make you feel stressed.  Find out what helps you feel less stressed. Try: ? Doing a relaxing activity, like yoga, meditation, or being out in nature. ? Listening to relaxing music. ? Doing relaxation techniques, like deep breathing. ? Taking steps to be healthy. These include getting lots of sleep, exercising, and eating a balanced diet. ? Talking with a mental health doctor. Sleep  Try to get at least 7 hours of sleep each night. Tobacco and nicotine  Do not use anything that has nicotine or tobacco, such as cigarettes and e-cigarettes. If you need help quitting, ask your doctor. Alcohol  If alcohol gives you a fast heartbeat, do  not drink alcohol.  If alcohol does not seem to give you a fast heartbeat, limit your alcohol. For nonpregnant women, this means no more than 1 drink a day. For men, this means no more than 2 drinks a day. "One drink" means one of these: ? 12 oz of beer. ? 5 oz of wine. ? 1 oz of hard liquor. Caffeine  If caffeine gives you a fast heartbeat, do not eat, drink, or use anything with caffeine in it.  If caffeine does not seem to give you a fast heartbeat, limit how much caffeine you eat, drink, or use. Stimulant drugs  Do not use stimulant drugs. These are drugs like cocaine or methamphetamine. If you need help quitting, ask your doctor. General instructions  Stay at a healthy weight.  Exercise regularly. Ask your doctor to suggest some good activities for you. Try one of these options: ? 150 minutes a week of gentle exercise, like walking or yoga. ? 75 minutes a week of exercise that is very active, like running or swimming. ? A combination of gentle exercise and very active exercise.  Do home treatments to slow down your heartbeat as told by your doctor.  Take over-the-counter and prescription medicines only as told by your doctor. Contact a doctor if:  You have a fast heartbeat more often.  Times of having a fast heartbeat last longer than before.  Your home treatments to slow down your heartbeat do not help.  You have new symptoms. Get help right away if:  You have chest pain.  Your symptoms get worse.  You have trouble breathing.  Your heart beats very fast for more than 20 minutes.  You pass out (faint). These symptoms may be an emergency. Do not wait to see if the symptoms will go away. Get medical help right away. Call your local emergency services (911 in the [place] Do not drive yourself to the hospital. This information is not intended to replace advice given to you by your health care provider. Make sure you discuss any questions you have with your health care  provider. Document Released: 02/27/2005 Document Revised: 11/04/2015 Document Reviewed: 11/04/2015 Elsevier Interactive Patient Education  2017 [employer].

## 2017-03-30 NOTE — Progress Notes (Addendum)
Chief Complaint  Patient presents with  . Follow-up   Follow up  1. Anxiety and mood improved on Zoloft 50 mg taking less of Xanax only 1x per day and she feels like she is sleeping better  2. She is having pelvic pain and saw OB/GYN in [place] who did US pelvic and TVUS and saw enlarged uterus from deposits of menses in uterus after Novosure per pt. He gave her Flagyl and Cipro to take x 10 days.  She reports she was tested for infection and it was negative.  She has also a h/o ovarian cyst but ovarian w/u this time was normal he gave her the option of Depo which she declined, OCP or hysterectomy.  She wants a second opinion and considering [facility] with [facility] but she will get back with me  3. She also needs referral to GI MD s/p gastric sleeve with h/o chronic gastritis on prior EGD. She needs GI MD w/in [facility] with h/o GERD. She prev had EGD 10/07/13 with Dr. Rayann Heman at [facility] +chronic gastritis. She also reports h/o lower abdominal pain and OB/GYN wanted her to get checked out with GI  -will refer to Dr. Silvio Pate vs Dr. Hilarie Fredrickson w/in [facility]   4. H/o SVT and 2 sundays ago her HR was 165 and vagal manuvers helped.  She is agreeable to see EP rec Dr. Rayann Heman or Dr. Caryl Comes.     Review of Systems  Constitutional: Negative for weight loss.  Respiratory: Negative for shortness of breath.   Cardiovascular: Positive for palpitations. Negative for chest pain.  Gastrointestinal: Positive for abdominal pain.  Skin: Negative for rash.  Psychiatric/Behavioral:       Anxiety and sleep improved    Past Medical History:  Diagnosis Date  . Anemia   . Anxiety   . Fibromyalgia   . Frequent headaches    Followed by Neurology - in [place] - stress  . GERD (gastroesophageal reflux disease)   . Heart murmur    Seen by Dr. Fletcher Anon - no murmur  . Hypertension 3 years ago  . Ovarian cyst   . Rheumatoid arthritis ([place]) From [facility]   Remission 2 years  . [facility] spotted fever 5 years  ago  . Sleep apnea    RESOLVED SINCE GASTRIC BYPASS SURGERY IN 2015  . SVT (supraventricular tachycardia) ([place]) Noticed 3 years ago   Followed by Dr. Fletcher Anon  . Syncope    h/o   . Syncope and collapse 3 years ago - Happened about 2 times - Last time    Seen by neuro and cardiology - increased heart rate related   Past Surgical History:  Procedure Laterality Date  . APPENDECTOMY  1984  . BREAST REDUCTION SURGERY  2003  . CHOLECYSTECTOMY N/A 09/01/2014   Procedure: LAPAROSCOPIC CHOLECYSTECTOMY;  Surgeon: Bonner Puna, MD;  Location: [facility] ORS;  Service: General;  Laterality: N/A;  . ENDOMETRIAL ABLATION  2010? Pt unsure  . LAPAROSCOPIC GASTRIC SLEEVE RESECTION    . REDUCTION MAMMAPLASTY Bilateral 2003  . TUBAL LIGATION  2001   Family History  Problem Relation Age of Onset  . Heart disease Mother   . Heart attack Mother   . Stroke Mother   . Hypertension Mother   . Hyperlipidemia Mother   . Arthritis Mother        Rheumatoid arthritis  . Kidney disease Mother   . Depression Mother   . Diabetes Mother   . Heart disease Father   .  Heart attack Father   . Hypertension Father   . Hyperlipidemia Father   . Kidney disease Father   . Diabetes Father   . COPD Father   . Cancer Maternal Grandmother        Breast cancer  . Heart attack Maternal Grandmother   . Breast cancer Maternal Grandmother   . Arthritis Maternal Grandfather        rheumatoid arthritis  . Diabetes Maternal Grandfather    Social History   Socioeconomic History  . Marital status: Married    Spouse name: Donnie  . Number of children: 3  . Years of education: 68  . Highest education level: Not on file  Social Needs  . Financial resource strain: Not on file  . Food insecurity - worry: Not on file  . Food insecurity - inability: Not on file  . Transportation needs - medical: Not on file  . Transportation needs - non-medical: Not on file  Occupational History  . Occupation: Respiratory Therapist     Employer:  [employer]  Tobacco Use  . Smoking status: Never Smoker  . Smokeless tobacco: Never Used  Substance and Sexual Activity  . Alcohol use: No  . Drug use: No  . Sexual activity: Yes    Birth control/protection: Other-see comments    Comment: Ablation  Other Topics Concern  . Not on file  Social History Narrative   Born in raised in [place], [place]. Currently resides in [place], [place]. Lives with husband (married since age 15 y.o), Donnie, and 3 children. 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 Enjoys camping, shopping, reading, and involved in church. Likes to ride motorcycle with husband and wears helmet (as of 01/2017 sold motorcycle)   Current Meds  Medication Sig  . ALPRAZolam (XANAX) 0.25 MG tablet Take 1 tablet (0.25 mg total) by mouth daily as needed for anxiety.  . Biotin 10 MG CAPS Take 1 capsule by mouth daily.  Marland Kitchen BIOTIN 5000 PO Take 1 tablet by mouth daily.   . calcium-vitamin D (OSCAL WITH D) 500-200 MG-UNIT per tablet Take 1 tablet by mouth daily.  Marland Kitchen gabapentin (NEURONTIN) 300 MG capsule TAKE 1 CAPSULE BY MOUTH AT BEDTIME.  . Multiple Vitamin (MULTIVITAMIN) tablet Take 1 tablet by mouth daily.  . pantoprazole (PROTONIX) 40 MG tablet TAKE 1 TABLET BY MOUTH ONCE DAILY.  Marland Kitchen sertraline (ZOLOFT) 50 MG tablet Take 1 tablet (50 mg total) by mouth daily.  . [DISCONTINUED] sertraline (ZOLOFT) 50 MG tablet Take 1 tablet (50 mg total) by mouth daily.   Allergies  Allergen Reactions  . Amoxicillin Hives  . Enbrel [Etanercept] Hives and Rash   Recent Results (from the past 2160 hour(s))  Comprehensive metabolic panel     Status: None   Collection Time: 02/21/17  8:20 AM  Result Value Ref Range   Sodium 138 135 - 145 mmol/L   Potassium 3.6 3.5 - 5.1 mmol/L   Chloride 104 101 - 111 mmol/L   CO2 26 22 - 32 mmol/L   Glucose, Bld 94 65 - 99 mg/dL   BUN 17 6 - 20 mg/dL   Creatinine, Ser 0.86 0.44 - 1.00 mg/dL   Calcium 9.0 8.9 - 10.3  mg/dL   Total Protein 7.3 6.5 - 8.1 g/dL   Albumin 4.1 3.5 - 5.0 g/dL   AST 16 15 - 41 U/L   ALT 16 14 - 54 U/L   Alkaline Phosphatase 59 38 - 126 U/L   Total Bilirubin  0.7 0.3 - 1.2 mg/dL   GFR calc non Af Amer >60 >60 mL/min   GFR calc Af Amer >60 >60 mL/min    Comment: (NOTE) The eGFR has been calculated using the CKD EPI equation. This calculation has not been validated in all clinical situations. eGFR's persistently <60 mL/min signify possible Chronic Kidney Disease.    Anion gap 8 5 - 15  Lipid panel     Status: None   Collection Time: 02/21/17  8:20 AM  Result Value Ref Range   Cholesterol 131 0 - 200 mg/dL   Triglycerides 76 <150 mg/dL   HDL 46 >40 mg/dL   Total CHOL/HDL Ratio 2.8 RATIO   VLDL 15 0 - 40 mg/dL   LDL Cholesterol 70 0 - 99 mg/dL    Comment:        Total Cholesterol/HDL:CHD Risk Coronary Heart Disease Risk Table                     Men   Women  1/2 Average Risk   3.4   3.3  Average Risk       5.0   4.4  2 X Average Risk   9.6   7.1  3 X Average Risk  23.4   11.0        Use the calculated Patient Ratio above and the CHD Risk Table to determine the patient's CHD Risk.        ATP III CLASSIFICATION (LDL):  <100     mg/dL   Optimal  100-129  mg/dL   Near or Above                    Optimal  130-159  mg/dL   Borderline  160-189  mg/dL   High  >190     mg/dL   Very High   CBC with Differential/Platelet     Status: None   Collection Time: 02/21/17  8:20 AM  Result Value Ref Range   WBC 6.6 4.0 - 10.5 K/uL   RBC 4.23 3.87 - 5.11 MIL/uL   Hemoglobin 12.1 12.0 - 15.0 g/dL   HCT 36.8 36.0 - 46.0 %   MCV 87.0 78.0 - 100.0 fL   MCH 28.6 26.0 - 34.0 pg   MCHC 32.9 30.0 - 36.0 g/dL   RDW 12.6 11.5 - 15.5 %   Platelets 304 150 - 400 K/uL   Neutrophils Relative % 51 %   Neutro Abs 3.4 1.7 - 7.7 K/uL   Lymphocytes Relative 41 %   Lymphs Abs 2.7 0.7 - 4.0 K/uL   Monocytes Relative 6 %   Monocytes Absolute 0.4 0.1 - 1.0 K/uL   Eosinophils Relative 2 %    Eosinophils Absolute 0.1 0.0 - 0.7 K/uL   Basophils Relative 0 %   Basophils Absolute 0.0 0.0 - 0.1 K/uL  Hemoglobin A1c     Status: None   Collection Time: 02/21/17  8:20 AM  Result Value Ref Range   Hgb A1c MFr Bld 5.2 4.8 - 5.6 %    Comment: (NOTE) Pre diabetes:          5.7%-6.4% Diabetes:              >6.4% Glycemic control for   <7.0% adults with diabetes    Mean Plasma Glucose 102.54 mg/dL    Comment: Performed at [facility], [address], [place], [place] [address]  T4, free     Status: None  Collection Time: 02/21/17  8:20 AM  Result Value Ref Range   Free T4 0.96 0.61 - 1.12 ng/dL    Comment: (NOTE) Biotin ingestion may interfere with free T4 tests. If the results are inconsistent with the TSH level, previous test results, or the clinical presentation, then consider biotin interference. If needed, order repeat testing after stopping biotin. Performed at [facility], [place] [address], [place], [place] [address]   TSH     Status: None   Collection Time: 02/21/17  8:20 AM  Result Value Ref Range   TSH 1.777 0.350 - 4.500 uIU/mL    Comment: Performed by a 3rd Generation assay with a functional sensitivity of <=0.01 uIU/mL.  VITAMIN D 25 Hydroxy (Vit-D Deficiency, Fractures)     Status: None   Collection Time: 02/21/17  8:20 AM  Result Value Ref Range   Vit D, 25-Hydroxy 42.2 30.0 - 100.0 ng/mL    Comment: (NOTE) Vitamin D deficiency has been defined by the [place] practice guideline as a level of serum 25-OH vitamin D less than 20 ng/mL (1,2). The Endocrine Society went on to further define vitamin D insufficiency as a level between 21 and 29 ng/mL (2). 1. IOM (Institute of Medicine). 2010. Dietary reference   intakes for calcium and D. [place]: The   [employer]. 2. Holick MF, Binkley Rodeo, Bischoff-Ferrari HA, et al.   Evaluation, treatment, and prevention of vitamin D   deficiency: an  Endocrine Society clinical practice   guideline. JCEM. 2011 Jul; 96(7):1911-30. Performed At: [facility] [place], [place] [id number] Rush Farmer MD [id number]   Urinalysis, Routine w reflex microscopic     Status: Abnormal   Collection Time: 02/21/17  8:20 AM  Result Value Ref Range   Color, Urine STRAW (A) YELLOW   APPearance CLEAR CLEAR   Specific Gravity, Urine 1.008 1.005 - 1.030   pH 6.0 5.0 - 8.0   Glucose, UA NEGATIVE NEGATIVE mg/dL   Hgb urine dipstick NEGATIVE NEGATIVE   Bilirubin Urine NEGATIVE NEGATIVE   Ketones, ur NEGATIVE NEGATIVE mg/dL   Protein, ur NEGATIVE NEGATIVE mg/dL   Nitrite NEGATIVE NEGATIVE   Leukocytes, UA NEGATIVE NEGATIVE  Hepatitis B surface antibody     Status: None   Collection Time: 02/21/17  8:20 AM  Result Value Ref Range   Hepatitis B-Post >1,000.0 Immunity>9.9 mIU/mL    Comment: (NOTE)  Status of Immunity                     Anti-HBs Level  ------------------                     -------------- Inconsistent with Immunity                   0.0 - 9.9 Consistent with Immunity                          >9.9 Performed At: [facility] [address] [place], [place] [id number] Rush Farmer MD [id number]    Objective  Body mass index is 32.94 kg/m. Wt Readings from Last 3 Encounters:  03/30/17 183 lb (83 kg)  02/15/17 185 lb (83.9 kg)  02/18/16 173 lb 8 oz (78.7 kg)   Temp Readings from Last 3 Encounters:  03/30/17 98.4 F (36.9 C) (Oral)  02/15/17 97.9 F (36.6 C) (Oral)  02/18/16 98.3 F (  36.8 C) (Oral)   BP Readings from Last 3 Encounters:  03/30/17 114/78  02/15/17 124/90  02/18/16 (!) 148/75   Pulse Readings from Last 3 Encounters:  03/30/17 96  02/15/17 (!) 106  02/18/16 76   O2 sat room air 99%  Physical Exam  Constitutional: She is oriented to person, place, and time and well-developed, well-nourished, and in no distress. Vital signs are normal.  HENT:  Head: Normocephalic  and atraumatic.  Mouth/Throat: Oropharynx is clear and moist and mucous membranes are normal.  Eyes: Conjunctivae are normal. Pupils are equal, round, and reactive to light.  Cardiovascular: Normal rate and regular rhythm.  Murmur heard. Pulmonary/Chest: Effort normal and breath sounds normal.  Neurological: She is alert and oriented to person, place, and time. Gait normal. Gait normal.  Skin: Skin is warm, dry and intact.  Psychiatric: Mood, memory, affect and judgment normal.  Nursing note and vitals reviewed.   Assessment   1. Anxiety/insomnia improved  2. Abdominal and pelvic pain ? Etiology GI vs GU related pending US pelvic and TVUS to review OB/GYN in [place]. S/p gastric sleep with previous EGD 09/2016 +chronic gastritis neg H pylori  3. SVT 4. HM  Plan  1. Cont Zoloft working for pt Xanax reduced to 1x per day  2. Pt wants second opinion from OB/GYN thinking about [facility] also disc with pt [facility] She will call back with name of OB/GYN she would like to be referred.   She would like referral to GI to r/o GI etiology (see history as above) will refer to Dr. Ross [facility]  3. Refer to Dr. Blima Dessert  4.  Had flu shot 11/2016  Pt still unclear on when had Tdap will have her look into that  Declines HPV vx  Hep B immune   Pap had 02/23/16 neg no h/o abnormal pap except 1991 possibly related to OCP use but resolved per pt due for repeat in 02/23/2019   Mammo due 08/01/17  Obtained notes from [facility] (Dr. Scarlette Shorts)    Provider: Dr. Olivia Mackie McLean-Scocuzza-Internal Medicine

## 2017-04-01 ENCOUNTER — Encounter: Payer: Self-pay | Admitting: Internal Medicine

## 2017-04-02 ENCOUNTER — Encounter: Payer: Self-pay | Admitting: Internal Medicine

## 2017-04-03 MED FILL — HYDROCODON-APAP 5-325: 5-325 | 3 days supply | Qty: 16 | Fill #0

## 2017-04-03 MED FILL — CHLORHEXIDINE 0.12% RINSE: 0.12 | 20 days supply | Qty: 473 | Fill #0

## 2017-04-04 ENCOUNTER — Encounter: Payer: Self-pay | Admitting: Internal Medicine

## 2017-04-04 DIAGNOSIS — N8000 Endometriosis of the uterus, unspecified: Secondary | ICD-10-CM | POA: Insufficient documentation

## 2017-04-04 DIAGNOSIS — N8003 Adenomyosis of the uterus: Secondary | ICD-10-CM | POA: Insufficient documentation

## 2017-04-09 MED FILL — SERTRALINE HCL 50 MG TABLET: 50 | 30 days supply | Qty: 30 | Fill #0

## 2017-04-16 ENCOUNTER — Ambulatory Visit: Payer: 59 | Admitting: Internal Medicine

## 2017-04-16 ENCOUNTER — Encounter: Payer: Self-pay | Admitting: Internal Medicine

## 2017-04-16 VITALS — BP 140/82 | HR 84 | Ht 62.5 in | Wt 181.0 lb

## 2017-04-16 DIAGNOSIS — I471 Supraventricular tachycardia: Secondary | ICD-10-CM | POA: Diagnosis not present

## 2017-04-16 NOTE — Progress Notes (Signed)
Electrophysiology Office Note   Date:  04/16/2017   ID:  Cathy Guerrero, DOB [birth year], MRN [id number]  PCP:  Guerrero, Cathy Glow, MD  Cardiologist:  Cathy Fletcher Anon Primary Electrophysiologist: Cathy Grayer, MD    CC: SVT   History of Present Illness: Cathy Guerrero is a 42 y.o. female who presents today for electrophysiology evaluation.   The patient is referred by Cathy Guerrero for EP consultation regarding SVT.  The patient reports initially having abrupt onset/offset tachypalpitations in 2012.  She had intermittent episodes which were difficulty to diagnose. She had one episode of syncope.  Her workup including echo and event monitor were unrevealing.  Finally, she had an episode of sustained tachycardia 06/14/15 for which she presented to the ED with short RP SVT at 175 bpm.  Tachycardia was terminated with adenosine.  She has had only 1 episode since  Episodes typically terminate with vagal maneuvers.  She has tried beta blockers but has not tolerated them.  Today, she denies symptoms of palpitations, chest pain, shortness of breath, orthopnea, PND, lower extremity edema, claudication, dizziness, presyncope, syncope, bleeding, or neurologic sequela. The patient is tolerating medications without difficulties and is otherwise without complaint today.    Past Medical History:  Diagnosis Date  . Anemia   . Anxiety   . Fibromyalgia   . Frequent headaches    Followed by Neurology - in [place] - stress  . GERD (gastroesophageal reflux disease)   . Hypertension 3 years ago  . Ovarian cyst   . Rheumatoid arthritis ([place]) From [facility]    Remission 2 years; saw Cathy. [place] Guerrero was on MTX, Humira, Prednisone; normal ESR 8, anti CCP neg, CRP neg, RF negative , ANA neg with h/o +ANA, anti DS DNA 08/2008  . [facility] spotted fever 5 years ago   +IgM serologies   . Sleep apnea    RESOLVED SINCE GASTRIC BYPASS SURGERY IN 2015  . SVT (supraventricular tachycardia) ([place]) Noticed  3 years ago   adenosine sensitive short PR SVT docymented by EKG 4/17  . Syncope and collapse 3 years ago - Happened about 2 times - Last time    Seen by neuro and cardiology - increased heart rate related   Past Surgical History:  Procedure Laterality Date  . APPENDECTOMY  1984  . BREAST REDUCTION SURGERY  2003  . CHOLECYSTECTOMY N/A 09/01/2014   Procedure: LAPAROSCOPIC CHOLECYSTECTOMY;  Surgeon: Bonner Puna, MD;  Location: [facility] ORS;  Service: General;  Laterality: N/A;  . ENDOMETRIAL ABLATION  2010? Pt unsure  . LAPAROSCOPIC GASTRIC SLEEVE RESECTION    . REDUCTION MAMMAPLASTY Bilateral 2003  . TUBAL LIGATION  2001     Current Outpatient Medications  Medication Sig Dispense Refill  . ALPRAZolam (XANAX) 0.25 MG tablet Take 1 tablet (0.25 mg total) by mouth daily as needed for anxiety. 30 tablet 1  . Biotin 10 MG CAPS Take 1 capsule by mouth daily.    Marland Kitchen BIOTIN 5000 PO Take 1 tablet by mouth daily.     . calcium-vitamin D (OSCAL WITH D) 500-200 MG-UNIT per tablet Take 1 tablet by mouth daily.    Marland Kitchen gabapentin (NEURONTIN) 300 MG capsule TAKE 1 CAPSULE BY MOUTH AT BEDTIME. 90 capsule 0  . Multiple Vitamin (MULTIVITAMIN) tablet Take 1 tablet by mouth daily.    . pantoprazole (PROTONIX) 40 MG tablet TAKE 1 TABLET BY MOUTH ONCE DAILY. 90 tablet 0  . sertraline (ZOLOFT) 50 MG tablet Take 1 tablet (50 mg  total) by mouth daily. 30 tablet 5   No current facility-administered medications for this visit.     Allergies:   Amoxicillin; Other; and Enbrel [etanercept]   Social History:  The patient  reports that  has never smoked. she has never used smokeless tobacco. She reports that she does not drink alcohol or use drugs.   Family History:  The patient's  family history includes Arthritis in her maternal grandfather and mother; Breast cancer in her maternal grandmother; COPD in her father; Cancer in her maternal grandmother; Depression in her mother; Diabetes in her father, maternal grandfather,  and mother; Heart attack in her father, maternal grandmother, and mother; Heart disease in her father and mother; Hyperlipidemia in her father and mother; Hypertension in her father and mother; Kidney disease in her father and mother; Stroke in her mother.    ROS:  Please see the history of present illness.   All other systems are personally reviewed and negative.    PHYSICAL EXAM: VS:  BP 140/82   Pulse 84   Ht 5' 2.5" (1.588 m)   Wt 181 lb (82.1 kg)   BMI 32.58 kg/m  , BMI Body mass index is 32.58 kg/m. GEN: Well nourished, well developed, in no acute distress  HEENT: normal  Neck: no JVD, carotid bruits, or masses Cardiac: RRR; no murmurs, rubs, or gallops,no edema  Respiratory:  clear to auscultation bilaterally, normal work of breathing GI: soft, nontender, nondistended, + BS MS: no deformity or atrophy  Skin: warm and dry  Neuro:  Strength and sensation are intact Psych: euthymic mood, full affect  EKG:  EKG is ordered today. The ekg ordered today is personally reviewed and shows sinus rhythm 84 bpm, PR 118 msec  ekg 06/14/15 is reviewed and reveals short RP SVT   Recent Labs: 02/21/2017: ALT 16; BUN 17; Creatinine, Ser 0.86; Hemoglobin 12.1; Platelets 304; Potassium 3.6; Sodium 138; TSH 1.777  personally reviewed   Lipid Panel     Component Value Date/Time   CHOL 131 02/21/2017 0820   TRIG 76 02/21/2017 0820   HDL 46 02/21/2017 0820   CHOLHDL 2.8 02/21/2017 0820   VLDL 15 02/21/2017 0820   LDLCALC 70 02/21/2017 0820   personally reviewed   Wt Readings from Last 3 Encounters:  04/16/17 181 lb (82.1 kg)  03/30/17 183 lb (83 kg)  02/15/17 185 lb (83.9 kg)      Other studies personally reviewed: Additional studies/ records that were reviewed today include: PCPs notes, prior echo and ekgs Review of the above records today demonstrates: as above   ASSESSMENT AND PLAN:  1.  SVT The patient has short RP SVT which has been terminated with adenosine.  She has  failed medical therapy with beta blockers. She has short PR interval suggesting possible accessory pathway. Therapeutic strategies for supraventricular tachycardia including medicine and ablation were discussed in detail with the patient today. Risk, benefits, and alternatives to EP study and radiofrequency ablation were also discussed in detail today. The patient is not yet ready to proceed with ablation.  She would like to consider her options further.  She will contact our office if she decides to proceed. We would require anesthesia, carto, ice for this procedure  Follow-up:  Return as needed  Current medicines are reviewed at length with the patient today.   The patient does not have concerns regarding her medicines.  The following changes were made today:  none  Labs/ tests ordered today include:  Orders Placed  This Encounter  Procedures  . EKG 12-Lead     Signed, Cathy Grayer, MD  04/16/2017 9:07 AM     [facility] [address] [place] [address] [phone] (office) [phone] (fax)

## 2017-04-16 NOTE — Patient Instructions (Addendum)
Medication Instructions:  Your physician recommends that you continue on your current medications as directed. Please refer to the Current Medication list given to you today.  Labwork: None ordered.  Testing/Procedures: Your physician has recommended that you have an ablation. Catheter ablation is a medical procedure used to treat some cardiac arrhythmias (irregular heartbeats). During catheter ablation, a long, thin, flexible tube is put into a blood vessel in your groin (upper thigh), or neck. This tube is called an ablation catheter. It is then guided to your heart through the blood vessel. Radio frequency waves destroy small areas of heart tissue where abnormal heartbeats may cause an arrhythmia to start. Please see the instruction sheet given to you today.  Follow-Up: Please call the office if you decide to go ahead with an SVT ablation [place]  Any Other Special Instructions Will Be Listed Below (If Applicable).  If you need a refill on your cardiac medications before your next appointment, please call your pharmacy.   Cardiac Ablation Cardiac ablation is a procedure to disable (ablate) a small amount of heart tissue in very specific places. The heart has many electrical connections. Sometimes these connections are abnormal and can cause the heart to beat very fast or irregularly. Ablating some of the problem areas can improve the heart rhythm or return it to normal. Ablation may be done for people who:  Have Wolff-Parkinson-White syndrome.  Have fast heart rhythms (tachycardia).  Have taken medicines for an abnormal heart rhythm (arrhythmia) that were not effective or caused side effects.  Have a high-risk heartbeat that may be life-threatening.  During the procedure, a small incision is made in the neck or the groin, and a long, thin, flexible tube (catheter) is inserted into the incision and moved to the heart. Small devices (electrodes) on the tip of the catheter  will send out electrical currents. A type of X-ray (fluoroscopy) will be used to help guide the catheter and to provide images of the heart. Tell a health care provider about:  Any allergies you have.  All medicines you are taking, including vitamins, herbs, eye drops, creams, and over-the-counter medicines.  Any problems you or family members have had with anesthetic medicines.  Any blood disorders you have.  Any surgeries you have had.  Any medical conditions you have, such as kidney failure.  Whether you are pregnant or may be pregnant. What are the risks? Generally, this is a safe procedure. However, problems may occur, including:  Infection.  Bruising and bleeding at the catheter insertion site.  Bleeding into the chest, especially into the sac that surrounds the heart. This is a serious complication.  Stroke or blood clots.  Damage to other structures or organs.  Allergic reaction to medicines or dyes.  Need for a permanent pacemaker if the normal electrical system is damaged. A pacemaker is a small computer that sends electrical signals to the heart and helps your heart beat normally.  The procedure not being fully effective. This may not be recognized until months later. Repeat ablation procedures are sometimes required.  What happens before the procedure?  Follow instructions from your health care provider about eating or drinking restrictions.  Ask your health care provider about: ? Changing or stopping your regular medicines. This is especially important if you are taking diabetes medicines or blood thinners. ? Taking medicines such as aspirin and ibuprofen. These medicines can thin your blood. Do not take these medicines before your procedure if your health care provider instructs you  not to.  Plan to have someone take you home from the hospital or clinic.  If you will be going home right after the procedure, plan to have someone with you for 24 hours. What  happens during the procedure?  To lower your risk of infection: ? Your health care team will wash or sanitize their hands. ? Your skin will be washed with soap. ? Hair may be removed from the incision area.  An IV tube will be inserted into one of your veins.  You will be given a medicine to help you relax (sedative).  The skin on your neck or groin will be numbed.  An incision will be made in your neck or your groin.  A needle will be inserted through the incision and into a large vein in your neck or groin.  A catheter will be inserted into the needle and moved to your heart.  Dye may be injected through the catheter to help your surgeon see the area of the heart that needs treatment.  Electrical currents will be sent from the catheter to ablate heart tissue in desired areas. There are three types of energy that may be used to ablate heart tissue: ? Heat (radiofrequency energy). ? Laser energy. ? Extreme cold (cryoablation).  When the necessary tissue has been ablated, the catheter will be removed.  Pressure will be held on the catheter insertion area to prevent excessive bleeding.  A bandage (dressing) will be placed over the catheter insertion area. The procedure may vary among health care providers and hospitals. What happens after the procedure?  Your blood pressure, heart rate, breathing rate, and blood oxygen level will be monitored until the medicines you were given have worn off.  Your catheter insertion area will be monitored for bleeding. You will need to lie still for a few hours to ensure that you do not bleed from the catheter insertion area.  Do not drive for 24 hours or as long as directed by your health care provider. Summary  Cardiac ablation is a procedure to disable (ablate) a small amount of heart tissue in very specific places. Ablating some of the problem areas can improve the heart rhythm or return it to normal.  During the procedure, electrical  currents will be sent from the catheter to ablate heart tissue in desired areas. This information is not intended to replace advice given to you by your health care provider. Make sure you discuss any questions you have with your health care provider. Document Released: 07/16/2008 Document Revised: 01/17/2016 Document Reviewed: 01/17/2016 Elsevier Interactive Patient Education  [employer].

## 2017-04-19 ENCOUNTER — Telehealth: Payer: Self-pay | Admitting: Internal Medicine

## 2017-04-19 DIAGNOSIS — Z01812 Encounter for preprocedural laboratory examination: Secondary | ICD-10-CM

## 2017-04-19 DIAGNOSIS — I471 Supraventricular tachycardia: Secondary | ICD-10-CM

## 2017-04-19 NOTE — Telephone Encounter (Signed)
Patient would like to schedule SVT ablation for 06/05/17. She understands I will arrange and call her back in the next week/two. She is appreciative

## 2017-04-19 NOTE — Telephone Encounter (Signed)
Close encounter 

## 2017-04-23 ENCOUNTER — Other Ambulatory Visit: Payer: Self-pay | Admitting: Family Medicine

## 2017-04-23 MED FILL — ALPRAZolam 0.25 MG TABS: 0.25 | 30 days supply | Qty: 30 | Fill #1

## 2017-04-23 MED FILL — CLIND PH-BENZOYL PEROX 1.2-: 1.2-5 | 30 days supply | Qty: 45 | Fill #4

## 2017-04-23 NOTE — Telephone Encounter (Signed)
Dr.mclean patient

## 2017-04-24 ENCOUNTER — Telehealth: Payer: Self-pay | Admitting: Cardiovascular Disease

## 2017-04-24 NOTE — Telephone Encounter (Signed)
Patient wife (Shirley) calling states that patient needs a [facility] of [facility]Dr. Dyer at [phone].  °

## 2017-04-24 NOTE — Telephone Encounter (Signed)
Is this the correct patient attached?

## 2017-04-25 NOTE — Telephone Encounter (Signed)
Dr Cathy Guerrero patient

## 2017-04-27 ENCOUNTER — Other Ambulatory Visit: Payer: Self-pay | Admitting: Internal Medicine

## 2017-04-27 DIAGNOSIS — K219 Gastro-esophageal reflux disease without esophagitis: Secondary | ICD-10-CM

## 2017-04-27 MED ORDER — PANTOPRAZOLE SODIUM 40 MG PO TBEC: 40.0000 mg | DELAYED_RELEASE_TABLET | Freq: Every day | ORAL | 1 refills | Status: DC

## 2017-04-27 MED FILL — PANTOPRAZOLE SOD DR 40 MG T: 40 | 90 days supply | Qty: 90 | Fill #0

## 2017-05-03 ENCOUNTER — Ambulatory Visit: Payer: 59 | Admitting: Cardiology

## 2017-05-09 ENCOUNTER — Telehealth: Payer: Self-pay | Admitting: *Deleted

## 2017-05-09 ENCOUNTER — Encounter: Payer: Self-pay | Admitting: *Deleted

## 2017-05-09 NOTE — Telephone Encounter (Signed)
The patient called me back and said Dr. Jackalyn Lombard office [facility] clinic) called her and said it would be 2 -4 weeks before they have her records.  The doctor she had seen is no longer there.  She told them her appointment with [place] is Friday 05-11-2017.  They said they will call her tomorrow and let her know if they can expedite the request.  I told her not to worry. Worse case senero, she can sign a release.  I will fax an urgent release of information to them.

## 2017-05-09 NOTE — Telephone Encounter (Signed)
lmtcb  Need to review procedure instructions and schedule pre procedure labs. Lab order in EPIC)

## 2017-05-09 NOTE — Telephone Encounter (Signed)
Instructions reviewed with patient. Letter sent via [place]. Pre procedure labs to be completed on 3/15. She understands office will call her to schedule a post ablation appointment.

## 2017-05-09 NOTE — Telephone Encounter (Signed)
I called and Lm for the patient to advise I have not received records from Dr. Jackalyn Lombard office. I asked her to call them and ask them to fax the records , "attention Nancyann Cotterman" . Fax to [phone].

## 2017-05-11 ENCOUNTER — Ambulatory Visit: Payer: 59 | Admitting: Physician Assistant

## 2017-05-11 ENCOUNTER — Encounter: Payer: Self-pay | Admitting: Physician Assistant

## 2017-05-11 ENCOUNTER — Other Ambulatory Visit (INDEPENDENT_AMBULATORY_CARE_PROVIDER_SITE_OTHER): Payer: 59

## 2017-05-11 VITALS — BP 142/74 | HR 120 | Ht 62.5 in | Wt 191.4 lb

## 2017-05-11 DIAGNOSIS — R198 Other specified symptoms and signs involving the digestive system and abdomen: Secondary | ICD-10-CM | POA: Diagnosis not present

## 2017-05-11 DIAGNOSIS — R1032 Left lower quadrant pain: Secondary | ICD-10-CM | POA: Diagnosis not present

## 2017-05-11 DIAGNOSIS — Z9884 Bariatric surgery status: Secondary | ICD-10-CM | POA: Diagnosis not present

## 2017-05-11 DIAGNOSIS — K219 Gastro-esophageal reflux disease without esophagitis: Secondary | ICD-10-CM | POA: Diagnosis not present

## 2017-05-11 LAB — SEDIMENTATION RATE: SED RATE: 8 mm/h (ref 0–20)

## 2017-05-11 LAB — COMPREHENSIVE METABOLIC PANEL
ALK PHOS: 61 U/L (ref 39–117)
ALT: 15 U/L (ref 0–35)
AST: 12 U/L (ref 0–37)
Albumin: 3.8 g/dL (ref 3.5–5.2)
BILIRUBIN TOTAL: 0.2 mg/dL (ref 0.2–1.2)
BUN: 12 mg/dL (ref 6–23)
CALCIUM: 9 mg/dL (ref 8.4–10.5)
CO2: 29 mEq/L (ref 19–32)
Chloride: 106 mEq/L (ref 96–112)
Creatinine, Ser: 0.77 mg/dL (ref 0.40–1.20)
GFR: 86.89 mL/min (ref >60.00)
GLUCOSE: 91 mg/dL (ref 70–99)
Potassium: 3.6 mEq/L (ref 3.5–5.1)
Sodium: 143 mEq/L (ref 135–145)
TOTAL PROTEIN: 7.1 g/dL (ref 6.0–8.3)

## 2017-05-11 LAB — CBC WITH DIFFERENTIAL/PLATELET
Basophils Absolute: 0 10*3/uL (ref 0.0–0.1)
Basophils Relative: 0.5 % (ref 0.0–3.0)
Eosinophils Absolute: 0.2 10*3/uL (ref 0.0–0.7)
Eosinophils Relative: 2.6 % (ref 0.0–5.0)
HCT: 35.4 % — ABNORMAL LOW (ref 36.0–46.0)
Hemoglobin: 12.1 g/dL (ref 12.0–15.0)
LYMPHS ABS: 3.4 10*3/uL (ref 0.7–4.0)
Lymphocytes Relative: 40.4 % (ref 12.0–46.0)
MCHC: 34.2 g/dL (ref 30.0–36.0)
MCV: 85.2 fl (ref 78.0–100.0)
MONOS PCT: 6.9 % (ref 3.0–12.0)
Monocytes Absolute: 0.6 10*3/uL (ref 0.1–1.0)
NEUTROS PCT: 49.6 % (ref 43.0–77.0)
Neutro Abs: 4.1 10*3/uL (ref 1.4–7.7)
PLATELETS: 286 10*3/uL (ref 150.0–400.0)
RBC: 4.15 Mil/uL (ref 3.87–5.11)
RDW: 13.2 % (ref 11.5–15.5)
WBC: 8.3 10*3/uL (ref 4.0–10.5)

## 2017-05-11 NOTE — Progress Notes (Signed)
Assessment and plans reviewed  

## 2017-05-11 NOTE — Progress Notes (Signed)
Subjective:    Patient ID: Cathy Guerrero, female    DOB: [birth year], 42 y.o.   MRN: [id number]  HPI Cathy Guerrero is a pleasant 42 year old white female, new to GI today, self-referred for evaluation of lower abdominal pain, and repeat EGD. 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 She is status post cholecystectomy in June 2016. She also underwent a sleeve gastrectomy done in [place] in the summer of 2015 for weight management. She says she initially did well after the bariatric surgery and lost a lot of weight. She got to the point that she was having difficulty with electrolytes and glucose etc. She gradually got over those symptoms and since has gained some of her weight back. Prior to having a sleeve gastrectomy she underwent a clearance endoscopy by Dr.Rein at: [facility]  clinic and was found to have an irregular Z line, which was not biopsied, a few superficial gastric ulcers the largest 3 mm. Gastric biopsies showed moderate chronic inactive gastritis no intestinal metaplasia and no H. pylori. 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 Biopsy showed antral-type mucosa with moderate chronic inactive gastritis and focal intestinal metaplasia. For that reason she was recommended to have follow-up EGD in 3 years. She started having problems with chronic GERD after her gallbladder was removed in 2016. She's been taking Protonix daily since which she says does control her symptoms. She says she has had problems with nausea off and on for years, without vomiting. She started having problems with lower abdominal pain in November 2018 which ihas  been present on a daily basis since. She says this is worsened somewhat. She's not had any real change in her bowel habits and tends towards constipation. No melena  or hematochezia. She has not been aware of any fever. She sometimes feels this is worse after eating. She had a recent GYN evaluation which was actually done in [place] ,[place] and had pelvic ultrasound because she has history of ovarian cysts. She was told that she did not have an ovarian cyst, she does have some inflammation of the lining of the uterus which was not felt to be causing her lower abdominal pain. The gynecologist told her that her bowel looked inflamed and he was concerned about her having diverticulitis and gave her a course of Flagyl. She did feel a little bit better while taking the Flagyl but now pain persists. She did not want to give me the name of this physician, but says she will not be going back there and has an appointment for second opinion in [place] with gynecology/Dr. Philis Pique in 2 weeks. Family history is negative for colon cancer and polyps.  Review of Systems Pertinent positive and negative review of systems were noted in the above HPI section.  All other review of systems was otherwise negative.  Outpatient Encounter Medications as of 05/11/2017  Medication Sig  . ALPRAZolam (XANAX) 0.25 MG tablet Take 1 tablet (0.25 mg total) by mouth daily as needed for anxiety.  . Biotin 10 MG CAPS Take 1 capsule by mouth daily.  . calcium-vitamin D (OSCAL WITH D) 500-200 MG-UNIT per tablet Take 1 tablet by mouth daily.  Marland Kitchen gabapentin (NEURONTIN) 300 MG capsule TAKE 1 CAPSULE BY MOUTH AT BEDTIME.  . Multiple Vitamin (MULTIVITAMIN) tablet Take 1 tablet by mouth  daily.  . pantoprazole (PROTONIX) 40 MG tablet Take 1 tablet (40 mg total) by mouth daily. 30 min. Before food  . sertraline (ZOLOFT) 50 MG tablet Take 1 tablet (50 mg total) by mouth daily.  . [DISCONTINUED] BIOTIN 5000 PO Take 1 tablet by mouth daily.    No facility-administered encounter medications on file as of 05/11/2017.    Allergies  Allergen Reactions  . Amoxicillin Hives  . Other     novacaine   . Enbrel  [Etanercept] Hives and Rash   Patient Active Problem List   Diagnosis Date Noted  . Uterus, adenomyosis 04/04/2017  . Paroxysmal SVT (supraventricular tachycardia) ([place]) 03/30/2017  . Chronic abdominal pain 03/30/2017  . Anxiety 02/15/2017  . Depression, major, single episode, mild ([place]) 02/15/2017  . Heart murmur 02/15/2017  . GERD (gastroesophageal reflux disease) 02/18/2016  . Insomnia 02/18/2016  . RLS (restless legs syndrome) 06/30/2014  . Obesity 08/12/2013  . Hypertension    Social History   Socioeconomic History  . Marital status: Married    Spouse name: Donnie  . Number of children: 3  . Years of education: 66  . Highest education level: Not on file  Social Needs  . Financial resource strain: Not on file  . Food insecurity - worry: Not on file  . Food insecurity - inability: Not on file  . Transportation needs - medical: Not on file  . Transportation needs - non-medical: Not on file  Occupational History  . Occupation: Respiratory Therapist    Employer: [employer]  Tobacco Use  . Smoking status: Never Smoker  . Smokeless tobacco: Never Used  Substance and Sexual Activity  . Alcohol use: No  . Drug use: No  . Sexual activity: Yes    Birth control/protection: Other-see comments    Comment: Ablation  Other Topics Concern  . Not on file  Social History Narrative   Born in raised in [place], [place]. Currently resides in [place], [place]. Lives with husband (married since age 1 y.o), Donnie, and 3 children. 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 Enjoys camping, shopping, reading, and involved in church. Likes to ride motorcycle with husband and wears helmet (as of 01/2017 sold motorcycle)    Ms. Selner's family history includes Arthritis in her maternal grandfather and mother; Breast cancer in her maternal grandmother; COPD in her father; Depression in her mother; Diabetes in her father, maternal grandfather, and mother; Heart  attack in her father, maternal grandmother, and mother; Heart disease in her father and mother; Hyperlipidemia in her father and mother; Hypertension in her father and mother; Kidney disease in her father and mother; Ovarian cancer in her sister; Stroke in her mother; Uterine cancer in her sister.      Objective:    Vitals:   05/11/17 1001  BP: (!) 142/74  Pulse: (!) 120    Physical Exam; well-developed white female in no acute distress, pleasant blood pressure 142/74, pulse 120. Height 5 foot 2, weight 191, BMI 34.4. HEENT; nontraumatic normocephalic EOMI PERRLA sclera anicteric, Cardiovascular ;regular rate and rhythm with S1-S2 no murmur or gallop she is tachycardic. Pulmonary ;clear bilaterally, Abdomen soft, no palpable mass or hepatosplenomegaly bowel sounds are present she is tender in the left mid and left lower quadrant there is no guarding or rebound. Rectal; exam not done, Extremities; no clubbing cyanosis or edema skin warm dry, Neuropsych; mood and affect appropriate       Assessment & Plan:   #52 42 year old female  with 3 month history of left-sided abdominal pain, persistent and of unclear etiology. Patient had a pelvic ultrasound done through a gynecologist recently with concerns for bowel inflammation or possible diverticulitis. She was given a course of Flagyl with minimal change in symptoms. Rule out occult colonic lesion, rule out diverticulitis, rule out other intra-abdominal inflammatory process #2 chronic GERD stable on Protonix #3 status post cholecystectomy #4 status post sleeve gastrectomy 2015 for obesity.  #5 previous EGD July 2015 done preoperatively with finding of an irregular Z line, tiny superficial gastric ulcers and a 4 mm umbilicated lesion in the gastric antrum. Biopsies of this lesion showed chronic inactive gastritis and focal intestinal metaplasia. She was advised to have 3 year interval follow-up. Z line was not biopsied.  #6 PSVT-patient is  scheduled to have an ablation on 06/05/2017  Plan; CBC, CMET and sedimentation rate Schedule for CT scan of the abdomen and pelvis with contrast next week. Continue Protonix 40 mg by mouth every morning Patient will be scheduled with Dr. Henrene Pastor for EGD. This will intentionally be scheduled in April to be done after she has had cardiac ablation for PSVT. Procedure was discussed in detail with patient including indications risks and benefits and she is agreeable to proceed. She may need colonoscopy, but this will be decided after CT of the abdomen and pelvis resulted.  Arles Rumbold S Lareina Espino PA-C 05/11/2017   Cc: McLean-Scocuzza, Olivia Mackie *

## 2017-05-11 NOTE — Patient Instructions (Addendum)
Please go to the basement level to have your labs drawn.  Continue Protonix 40 mg, 1 tab every morning.  You have been scheduled for an endoscopy. Please follow written instructions given to you at your visit today. If you use inhalers (even only as needed), please bring them with you on the day of your procedure.   You have been scheduled for a CT scan of the abdomen and pelvis at [place] are scheduled on Monday 05-14-2017 at 10:00 am. You should arrive 15 minutes prior to your appointment time for registration. Please follow the written instructions below on the day of your exam:  WARNING: IF YOU ARE ALLERGIC TO IODINE/X-RAY DYE, PLEASE NOTIFY RADIOLOGY IMMEDIATELY AT [phone]! YOU WILL BE GIVEN A 13 HOUR PREMEDICATION PREP.  1) Do not eat anything after 6:00 am (4 hours prior to your test) 2) You have been given 2 bottles of oral contrast to drink. The solution may taste               better if refrigerated, but do NOT add ice or any other liquid to this solution. Shake             well before drinking.    Drink 1 bottle of contrast @ 8:00 am (2 hours prior to your exam)  Drink 1 bottle of contrast @ 9:00 am (1 hour prior to your exam)  You may take any medications as prescribed with a small amount of water except for the following: Metformin, Glucophage, Glucovance, Avandamet, Riomet, Fortamet, Actoplus Met, Janumet, Glumetza or Metaglip. The above medications must be held the day of the exam AND 48 hours after the exam.  The purpose of you drinking the oral contrast is to aid in the visualization of your intestinal tract. The contrast solution may cause some diarrhea. Before your exam is started, you will be given a small amount of fluid to drink. Depending on your individual set of symptoms, you may also receive an intravenous injection of x-ray contrast/dye. Plan on being at [facility] for 30 minutes or long, depending on the type of exam you are having  performed.  If you have any questions regarding your exam or if you need to reschedule, you may call the CT department at [phone] between the hours of 8:00 am and 5:00 pm, Monday-Friday.  ________________________________________________________________________  If you are age 43 or younger, your body mass index should be between 19-25. Your Body mass index is 34.45 kg/m. If this is out of the aformentioned range listed, please consider follow up with your Primary Care Provider.

## 2017-05-14 ENCOUNTER — Ambulatory Visit (HOSPITAL_COMMUNITY)
Admission: RE | Admit: 2017-05-14 | Discharge: 2017-05-14 | Disposition: A | Discharge status: Home / Self Care | Payer: 59 | Source: Ambulatory Visit | Attending: Physician Assistant | Admitting: Physician Assistant

## 2017-05-14 DIAGNOSIS — R1110 Vomiting, unspecified: Secondary | ICD-10-CM | POA: Diagnosis not present

## 2017-05-14 DIAGNOSIS — K219 Gastro-esophageal reflux disease without esophagitis: Secondary | ICD-10-CM | POA: Diagnosis not present

## 2017-05-14 DIAGNOSIS — R198 Other specified symptoms and signs involving the digestive system and abdomen: Secondary | ICD-10-CM | POA: Diagnosis not present

## 2017-05-14 DIAGNOSIS — R109 Unspecified abdominal pain: Secondary | ICD-10-CM | POA: Diagnosis not present

## 2017-05-14 DIAGNOSIS — R197 Diarrhea, unspecified: Secondary | ICD-10-CM | POA: Diagnosis not present

## 2017-05-14 MED ORDER — IOPAMIDOL (ISOVUE-300) INJECTION 61%
100.0000 mL | Freq: Once | INTRAVENOUS | Status: AC | PRN
  Administered 2017-05-14: 100 mL via INTRAVENOUS

## 2017-05-14 MED FILL — SERTRALINE HCL 50 MG TABLET: 50 | 30 days supply | Qty: 30 | Fill #1

## 2017-05-16 NOTE — Telephone Encounter (Signed)
Pre procedure work up complete.  Closing encounter.

## 2017-05-24 ENCOUNTER — Other Ambulatory Visit: Payer: Self-pay | Admitting: Internal Medicine

## 2017-05-24 DIAGNOSIS — F419 Anxiety disorder, unspecified: Secondary | ICD-10-CM

## 2017-05-24 MED ORDER — ALPRAZOLAM 0.25 MG PO TABS
0.2500 mg | ORAL_TABLET | Freq: Every day | ORAL | 1 refills | Status: DC | PRN
Start: 2017-05-24 — End: 2017-08-09

## 2017-05-25 ENCOUNTER — Other Ambulatory Visit: Payer: 59

## 2017-05-25 DIAGNOSIS — Z01812 Encounter for preprocedural laboratory examination: Secondary | ICD-10-CM

## 2017-05-25 DIAGNOSIS — I471 Supraventricular tachycardia: Secondary | ICD-10-CM | POA: Diagnosis not present

## 2017-05-25 DIAGNOSIS — N800 Endometriosis of uterus: Secondary | ICD-10-CM | POA: Diagnosis not present

## 2017-05-25 LAB — CBC WITH DIFFERENTIAL/PLATELET
BASOS ABS: 0 10*3/uL (ref 0.0–0.2)
BASOS: 0 %
EOS (ABSOLUTE): 0.1 10*3/uL (ref 0.0–0.4)
Eos: 2 %
Hematocrit: 34.2 % (ref 34.0–46.6)
Hemoglobin: 11.9 g/dL (ref 11.1–15.9)
IMMATURE GRANULOCYTES: 0 %
Immature Grans (Abs): 0 10*3/uL (ref 0.0–0.1)
LYMPHS: 38 %
Lymphocytes Absolute: 2.8 10*3/uL (ref 0.7–3.1)
MCH: 29.4 pg (ref 26.6–33.0)
MCHC: 34.8 g/dL (ref 31.5–35.7)
MCV: 84 fL (ref 79–97)
MONOCYTES: 7 %
Monocytes Absolute: 0.5 10*3/uL (ref 0.1–0.9)
NEUTROS ABS: 4.1 10*3/uL (ref 1.4–7.0)
Neutrophils: 53 %
PLATELETS: 288 10*3/uL (ref 150–379)
RBC: 4.05 x10E6/uL (ref 3.77–5.28)
RDW: 13.4 % (ref 12.3–15.4)
WBC: 7.5 10*3/uL (ref 3.4–10.8)

## 2017-05-25 LAB — BASIC METABOLIC PANEL
BUN/Creatinine Ratio: 15 (ref 9–23)
BUN: 11 mg/dL (ref 6–24)
CALCIUM: 9 mg/dL (ref 8.7–10.2)
CO2: 23 mmol/L (ref 20–29)
Chloride: 102 mmol/L (ref 96–106)
Creatinine, Ser: 0.71 mg/dL (ref 0.57–1.00)
GFR, EST AFRICAN AMERICAN: 121 mL/min/{1.73_m2} (ref >59)
GFR, EST NON AFRICAN AMERICAN: 105 mL/min/{1.73_m2} (ref >59)
Glucose: 110 mg/dL — ABNORMAL HIGH (ref 65–99)
Potassium: 3.9 mmol/L (ref 3.5–5.2)
Sodium: 140 mmol/L (ref 134–144)

## 2017-05-25 MED FILL — NORETHINDRONE 0.35 MG TAB: 0.35 | 84 days supply | Qty: 84 | Fill #0

## 2017-05-25 MED FILL — ALPRAZolam 0.25 MG TABS: 0.25 | 30 days supply | Qty: 30 | Fill #0

## 2017-05-30 ENCOUNTER — Telehealth: Payer: Self-pay

## 2017-05-30 NOTE — Telephone Encounter (Signed)
Refill request for Gabapentin, last seen 03-30-2017, last filled12-18-08.  Please advise.

## 2017-05-31 MED ORDER — GABAPENTIN 300 MG PO CAPS: 300.0000 mg | ORAL_CAPSULE | Freq: Every day | ORAL | 0 refills | Status: DC

## 2017-05-31 MED FILL — GABAPENTIN 300 MG CAPSULE: 300 | 90 days supply | Qty: 90 | Fill #0

## 2017-05-31 NOTE — Telephone Encounter (Signed)
Medication refilled

## 2017-05-31 NOTE — Addendum Note (Signed)
Addended by: Crecencio Mc on: 05/31/2017 01:35 PM   Modules accepted: Orders

## 2017-06-04 ENCOUNTER — Encounter (HOSPITAL_COMMUNITY): Payer: Self-pay | Admitting: Anesthesiology

## 2017-06-05 ENCOUNTER — Encounter (HOSPITAL_COMMUNITY)
Admission: RE | Disposition: A | Discharge status: Home / Self Care | Payer: Self-pay | Source: Ambulatory Visit | Attending: Internal Medicine

## 2017-06-05 ENCOUNTER — Ambulatory Visit: Payer: 59 | Admitting: Internal Medicine

## 2017-06-05 ENCOUNTER — Ambulatory Visit (HOSPITAL_COMMUNITY)
Admission: RE | Admit: 2017-06-05 | Discharge: 2017-06-05 | Disposition: A | Discharge status: Home / Self Care | Payer: 59 | Source: Ambulatory Visit | Attending: Internal Medicine | Admitting: Internal Medicine

## 2017-06-05 ENCOUNTER — Ambulatory Visit (HOSPITAL_COMMUNITY): Payer: 59 | Admitting: Certified Registered Nurse Anesthetist

## 2017-06-05 DIAGNOSIS — Z880 Allergy status to penicillin: Secondary | ICD-10-CM | POA: Diagnosis not present

## 2017-06-05 DIAGNOSIS — M797 Fibromyalgia: Secondary | ICD-10-CM | POA: Diagnosis not present

## 2017-06-05 DIAGNOSIS — F419 Anxiety disorder, unspecified: Secondary | ICD-10-CM | POA: Insufficient documentation

## 2017-06-05 DIAGNOSIS — F329 Major depressive disorder, single episode, unspecified: Secondary | ICD-10-CM | POA: Diagnosis not present

## 2017-06-05 DIAGNOSIS — I10 Essential (primary) hypertension: Secondary | ICD-10-CM | POA: Diagnosis not present

## 2017-06-05 DIAGNOSIS — Z9884 Bariatric surgery status: Secondary | ICD-10-CM | POA: Insufficient documentation

## 2017-06-05 DIAGNOSIS — M069 Rheumatoid arthritis, unspecified: Secondary | ICD-10-CM | POA: Diagnosis not present

## 2017-06-05 DIAGNOSIS — K219 Gastro-esophageal reflux disease without esophagitis: Secondary | ICD-10-CM | POA: Insufficient documentation

## 2017-06-05 DIAGNOSIS — Z79899 Other long term (current) drug therapy: Secondary | ICD-10-CM | POA: Diagnosis not present

## 2017-06-05 DIAGNOSIS — I471 Supraventricular tachycardia, unspecified: Secondary | ICD-10-CM | POA: Diagnosis present

## 2017-06-05 HISTORY — PX: SVT ABLATION: EP1225

## 2017-06-05 HISTORY — PX: CARDIAC ELECTROPHYSIOLOGY MAPPING AND ABLATION: SHX1292

## 2017-06-05 SURGERY — SVT ABLATION
Anesthesia: Monitor Anesthesia Care

## 2017-06-05 MED ORDER — SODIUM CHLORIDE 0.9 % IV SOLN
INTRAVENOUS | Status: DC
  Administered 2017-06-05: 07:00:00 via INTRAVENOUS

## 2017-06-05 MED ORDER — ONDANSETRON HCL 4 MG/2ML IJ SOLN: 4.0000 mg | Freq: Four times a day (QID) | INTRAMUSCULAR | Status: DC | PRN

## 2017-06-05 MED ORDER — SODIUM CHLORIDE 0.9% FLUSH: 3.0000 mL | INTRAVENOUS | Status: DC | PRN

## 2017-06-05 MED ORDER — SODIUM CHLORIDE 0.9 % IV SOLN: 250.0000 mL | INTRAVENOUS | Status: DC | PRN

## 2017-06-05 MED ORDER — MIDAZOLAM HCL 5 MG/5ML IJ SOLN
INTRAMUSCULAR | Status: DC | PRN
  Administered 2017-06-05: 2 mg via INTRAVENOUS

## 2017-06-05 MED ORDER — DIPHENHYDRAMINE HCL 50 MG/ML IJ SOLN
INTRAMUSCULAR | Status: AC
  Filled 2017-06-05: qty 1

## 2017-06-05 MED ORDER — ACETAMINOPHEN 325 MG PO TABS: 650.0000 mg | ORAL_TABLET | ORAL | Status: DC | PRN

## 2017-06-05 MED ORDER — PROPOFOL 10 MG/ML IV BOLUS
INTRAVENOUS | Status: DC | PRN
  Administered 2017-06-05: 20 mg via INTRAVENOUS
  Administered 2017-06-05: 30 mg via INTRAVENOUS

## 2017-06-05 MED ORDER — ONDANSETRON HCL 4 MG/2ML IJ SOLN
INTRAMUSCULAR | Status: DC | PRN
  Administered 2017-06-05: 4 mg via INTRAVENOUS

## 2017-06-05 MED ORDER — ISOPROTERENOL HCL 0.2 MG/ML IJ SOLN
INTRAMUSCULAR | Status: AC
  Filled 2017-06-05: qty 5

## 2017-06-05 MED ORDER — FENTANYL CITRATE (PF) 100 MCG/2ML IJ SOLN
INTRAMUSCULAR | Status: DC | PRN
  Administered 2017-06-05: 25 ug via INTRAVENOUS
  Administered 2017-06-05: 50 ug via INTRAVENOUS
  Administered 2017-06-05: 25 ug via INTRAVENOUS

## 2017-06-05 MED ORDER — BUPIVACAINE HCL (PF) 0.25 % IJ SOLN
INTRAMUSCULAR | Status: AC
  Filled 2017-06-05: qty 30

## 2017-06-05 MED ORDER — DIPHENHYDRAMINE HCL 50 MG/ML IJ SOLN
INTRAMUSCULAR | Status: DC | PRN
  Administered 2017-06-05 (×2): 25 mg via INTRAVENOUS

## 2017-06-05 MED ORDER — SODIUM CHLORIDE 0.9% FLUSH: 3.0000 mL | Freq: Two times a day (BID) | INTRAVENOUS | Status: DC

## 2017-06-05 MED ORDER — ISOPROTERENOL HCL 0.2 MG/ML IJ SOLN
INTRAVENOUS | Status: DC | PRN
  Administered 2017-06-05: 4 ug/min via INTRAVENOUS

## 2017-06-05 MED ORDER — BUPIVACAINE HCL (PF) 0.25 % IJ SOLN
INTRAMUSCULAR | Status: DC | PRN
  Administered 2017-06-05: 30 mL

## 2017-06-05 MED ORDER — PROPOFOL 500 MG/50ML IV EMUL
INTRAVENOUS | Status: DC | PRN
  Administered 2017-06-05: 50 ug/kg/min via INTRAVENOUS

## 2017-06-05 MED ORDER — DEXAMETHASONE SODIUM PHOSPHATE 10 MG/ML IJ SOLN
INTRAMUSCULAR | Status: DC | PRN
  Administered 2017-06-05: 10 mg via INTRAVENOUS

## 2017-06-05 MED ORDER — HYDROCODONE-ACETAMINOPHEN 5-325 MG PO TABS: 1.0000 | ORAL_TABLET | ORAL | Status: DC | PRN

## 2017-06-05 SURGICAL SUPPLY — 11 items
BLANKET WARM UNDERBOD FULL ACC (MISCELLANEOUS) ×3 IMPLANT
CATH EZ STEER NAV 4MM D-F CUR (ABLATOR) ×3 IMPLANT
CATH JOSEPHSON QUAD-ALLRED 6FR (CATHETERS) ×6 IMPLANT
CATH WEB BIDIR CS D-F NONAUTO (CATHETERS) ×3 IMPLANT
PACK EP LATEX FREE (CUSTOM PROCEDURE TRAY) ×2
PACK EP LF (CUSTOM PROCEDURE TRAY) ×1 IMPLANT
PAD DEFIB LIFELINK (PAD) ×3 IMPLANT
PATCH CARTO3 (PAD) ×3 IMPLANT
SHEATH AVANTI 11CM 6FR (SHEATH) ×3 IMPLANT
SHEATH AVANTI 11CM 7FR (SHEATH) ×3 IMPLANT
SHEATH AVANTI 11CM 8FR (SHEATH) ×3 IMPLANT

## 2017-06-05 NOTE — H&P (Signed)
CC: SVT   History of Present Illness: Cathy Guerrero is a 42 y.o. female who presents today for electrophysiology study and ablation for SVT.   The patient reports initially having abrupt onset/offset tachypalpitations in 2012.  She had intermittent episodes which were difficulty to diagnose. She had one episode of syncope.  Her workup including echo and event monitor were unrevealing.  Finally, she had an episode of sustained tachycardia 06/14/15 for which she presented to the ED with short RP SVT at 175 bpm.  Tachycardia was terminated with adenosine.  She has had only 1 episode since  Episodes typically terminate with vagal maneuvers.  She has tried beta blockers but has not tolerated them.  Today, she denies symptoms of palpitations, chest pain, shortness of breath, orthopnea, PND, lower extremity edema, claudication, dizziness, presyncope, syncope, bleeding, or neurologic sequela. The patient is tolerating medications without difficulties and is otherwise without complaint today.        Past Medical History:  Diagnosis Date  . Anemia   . Anxiety   . Fibromyalgia   . Frequent headaches    Followed by Neurology - in [place] - stress  . GERD (gastroesophageal reflux disease)   . Hypertension 3 years ago  . Ovarian cyst   . Rheumatoid arthritis ([place]) From [facility]    Remission 2 years; saw Dr. Wadie Lessen [place] was on MTX, Humira, Prednisone; normal ESR 8, anti CCP neg, CRP neg, RF negative , ANA neg with h/o +ANA, anti DS DNA 08/2008  . [facility] spotted fever 5 years ago   +IgM serologies   . Sleep apnea    RESOLVED SINCE GASTRIC BYPASS SURGERY IN 2015  . SVT (supraventricular tachycardia) ([place]) Noticed 3 years ago   adenosine sensitive short PR SVT docymented by EKG 4/17  . Syncope and collapse 3 years ago - Happened about 2 times - Last time    Seen by neuro and cardiology - increased heart rate related        Past Surgical History:  Procedure Laterality Date   . APPENDECTOMY  1984  . BREAST REDUCTION SURGERY  2003  . CHOLECYSTECTOMY N/A 09/01/2014   Procedure: LAPAROSCOPIC CHOLECYSTECTOMY;  Surgeon: Bonner Puna, MD;  Location: [facility] ORS;  Service: General;  Laterality: N/A;  . ENDOMETRIAL ABLATION  2010? Pt unsure  . LAPAROSCOPIC GASTRIC SLEEVE RESECTION    . REDUCTION MAMMAPLASTY Bilateral 2003  . TUBAL LIGATION  2001           Current Outpatient Medications  Medication Sig Dispense Refill  . ALPRAZolam (XANAX) 0.25 MG tablet Take 1 tablet (0.25 mg total) by mouth daily as needed for anxiety. 30 tablet 1  . Biotin 10 MG CAPS Take 1 capsule by mouth daily.    Marland Kitchen BIOTIN 5000 PO Take 1 tablet by mouth daily.     . calcium-vitamin D (OSCAL WITH D) 500-200 MG-UNIT per tablet Take 1 tablet by mouth daily.    Marland Kitchen gabapentin (NEURONTIN) 300 MG capsule TAKE 1 CAPSULE BY MOUTH AT BEDTIME. 90 capsule 0  . Multiple Vitamin (MULTIVITAMIN) tablet Take 1 tablet by mouth daily.    . pantoprazole (PROTONIX) 40 MG tablet TAKE 1 TABLET BY MOUTH ONCE DAILY. 90 tablet 0  . sertraline (ZOLOFT) 50 MG tablet Take 1 tablet (50 mg total) by mouth daily. 30 tablet 5   No current facility-administered medications for this visit.     Allergies:   Amoxicillin; Other; and Enbrel [etanercept]   Social History:  The  patient  reports that  has never smoked. she has never used smokeless tobacco. She reports that she does not drink alcohol or use drugs.   Family History:  The patient's  family history includes Arthritis in her maternal grandfather and mother; Breast cancer in her maternal grandmother; COPD in her father; Cancer in her maternal grandmother; Depression in her mother; Diabetes in her father, maternal grandfather, and mother; Heart attack in her father, maternal grandmother, and mother; Heart disease in her father and mother; Hyperlipidemia in her father and mother; Hypertension in her father and mother; Kidney disease in her father and mother;  Stroke in her mother.    ROS:  Please see the history of present illness.   All other systems are personally reviewed and negative.    PHYSICAL EXAM: Vitals:   06/05/17 0608  BP: (!) 133/93  Pulse: 83  Temp: 98 F (36.7 C)  SpO2: 100%   GEN: Well nourished, well developed, in no acute distress  HEENT: normal  Neck: no JVD, carotid bruits, or masses Cardiac: RRR; no murmurs, rubs, or gallops,no edema  Respiratory:  clear to auscultation bilaterally, normal work of breathing GI: soft, nontender, nondistended, + BS MS: no deformity or atrophy  Skin: warm and dry  Neuro:  Strength and sensation are intact Psych: euthymic mood, full affect   ekg 06/14/15 is reviewed and reveals short RP SVT  ASSESSMENT AND PLAN:  1.  SVT The patient has short RP SVT which has been terminated with adenosine.  She has failed medical therapy with beta blockers. Therapeutic strategies for supraventricular tachycardia including medicine and ablation were discussed in detail with the patient today. Risk, benefits, and alternatives to EP study and radiofrequency ablation were also discussed in detail today. These risks include but are not limited to stroke, bleeding, vascular damage, tamponade, perforation, damage to the heart and other structures, AV block requiring pacemaker, worsening renal function, and death. The patient understands these risk and wishes to proceed at this time.  Thompson Grayer MD, [facility] 06/05/2017 7:14 AM

## 2017-06-05 NOTE — Progress Notes (Signed)
Site area: rt groin fv sheaths x3 Site Prior to Removal:  Level 0 Pressure Applied For:  20 minutes Manual:   yes Patient Status During Pull:  stable Post Pull Site:  Level  0 Post Pull Instructions Given:  yes Post Pull Pulses Present: palpable rt dp Dressing Applied:  Gauze and tegaderm Bedrest begins @ 1000 Comments:  IV saline locked

## 2017-06-05 NOTE — Discharge Instructions (Signed)

## 2017-06-05 NOTE — Transfer of Care (Signed)
Immediate Anesthesia Transfer of Care Note  Patient: Cathy Guerrero  Procedure(s) Performed: SVT ABLATION (N/A )  Patient Location: Cath Lab  Anesthesia Type:MAC  Level of Consciousness: awake, alert , oriented and patient cooperative  Airway & Oxygen Therapy: Patient Spontanous Breathing and Patient connected to nasal cannula oxygen  Post-op Assessment: Report given to RN and Post -op Vital signs reviewed and stable  Post vital signs: Reviewed and stable  Last Vitals:  Vitals Value Taken Time  BP    Temp    Pulse 111 06/05/2017  9:24 AM  Resp 18 06/05/2017  9:24 AM  SpO2 99 % 06/05/2017  9:24 AM  Vitals shown include unvalidated device data.  Last Pain:  Vitals:   06/05/17 0608  TempSrc: Oral         Complications: No apparent anesthesia complications

## 2017-06-05 NOTE — Progress Notes (Addendum)
The patient is seen post procedure  She is feeling well without symptoms, no site discomfort Post procedure site care and activity restrictions were discussed with the patient Site is stable without bleeding or hematoma Routine post procedure follow up has been arranged.    Anticipate discharge once bed rest is completed, after ambulation if patient and site remain stable Patient is agreeable and comfortable with the plan. Dr. Rayann Heman has seen the patient  Tommye Standard, PA-C

## 2017-06-05 NOTE — Anesthesia Preprocedure Evaluation (Addendum)
Anesthesia Evaluation  Patient identified by MRN, date of birth, ID band Patient awake    Reviewed: Allergy & Precautions, NPO status , Patient's Chart, lab work & pertinent test results  Airway Mallampati: I       Dental no notable dental hx. (+) Teeth Intact   Pulmonary    Pulmonary exam normal breath sounds clear to auscultation       Cardiovascular hypertension, Normal cardiovascular exam Rhythm:Regular Rate:Normal     Neuro/Psych PSYCHIATRIC DISORDERS Anxiety Depression    GI/Hepatic Neg liver ROS, GERD  Medicated and Controlled,  Endo/Other  negative endocrine ROS  Renal/GU negative Renal ROS  negative genitourinary   Musculoskeletal   Abdominal (+) + obese,   Peds  Hematology   Anesthesia Other Findings   Reproductive/Obstetrics                            Anesthesia Physical Anesthesia Plan  ASA: II  Anesthesia Plan: MAC   Post-op Pain Management:    Induction:   PONV Risk Score and Plan: 3 and Ondansetron and Dexamethasone  Airway Management Planned: Natural Airway and Simple Face Mask  Additional Equipment:   Intra-op Plan:   Post-operative Plan:   Informed Consent: I have reviewed the patients History and Physical, chart, labs and discussed the procedure including the risks, benefits and alternatives for the proposed anesthesia with the patient or authorized representative who has indicated his/her understanding and acceptance.   Dental advisory given  Plan Discussed with: CRNA and Surgeon  Anesthesia Plan Comments:        Anesthesia Quick Evaluation

## 2017-06-05 NOTE — Progress Notes (Signed)
Pt ambulated after bedrest, site level 0, pt states she feels great and no pain. Dressing for D/C

## 2017-06-05 NOTE — Anesthesia Postprocedure Evaluation (Signed)
Anesthesia Post Note  Patient: Cathy Guerrero  Procedure(s) Performed: SVT ABLATION (N/A )     Patient location during evaluation: Cath Lab Anesthesia Type: MAC Level of consciousness: awake Pain management: pain level controlled Vital Signs Assessment: post-procedure vital signs reviewed and stable Respiratory status: spontaneous breathing Cardiovascular status: stable Postop Assessment: no apparent nausea or vomiting Anesthetic complications: no    Last Vitals:  Vitals:   06/05/17 1040 06/05/17 1055  BP: 123/74 131/81  Pulse: 96 96  Resp: 17 15  Temp:    SpO2: 97% 99%    Last Pain:  Vitals:   06/05/17 1000  TempSrc: Temporal   Pain Goal:                 Kaitlynne Wenz JR,JOHN Wilder Kurowski

## 2017-06-06 ENCOUNTER — Encounter (HOSPITAL_COMMUNITY): Payer: Self-pay | Admitting: Internal Medicine

## 2017-06-14 DIAGNOSIS — L814 Other melanin hyperpigmentation: Secondary | ICD-10-CM | POA: Diagnosis not present

## 2017-06-14 DIAGNOSIS — D2262 Melanocytic nevi of left upper limb, including shoulder: Secondary | ICD-10-CM | POA: Diagnosis not present

## 2017-06-14 DIAGNOSIS — L821 Other seborrheic keratosis: Secondary | ICD-10-CM | POA: Diagnosis not present

## 2017-06-14 DIAGNOSIS — D1801 Hemangioma of skin and subcutaneous tissue: Secondary | ICD-10-CM | POA: Diagnosis not present

## 2017-06-14 DIAGNOSIS — D2372 Other benign neoplasm of skin of left lower limb, including hip: Secondary | ICD-10-CM | POA: Diagnosis not present

## 2017-06-14 DIAGNOSIS — D225 Melanocytic nevi of trunk: Secondary | ICD-10-CM | POA: Diagnosis not present

## 2017-06-14 DIAGNOSIS — L700 Acne vulgaris: Secondary | ICD-10-CM | POA: Diagnosis not present

## 2017-06-14 DIAGNOSIS — D2261 Melanocytic nevi of right upper limb, including shoulder: Secondary | ICD-10-CM | POA: Diagnosis not present

## 2017-06-14 MED FILL — CLIND PH-BENZOYL PEROX 1.2-: 1.2-5 | 30 days supply | Qty: 45 | Fill #0

## 2017-06-18 MED FILL — SERTRALINE HCL 50 MG TABLET: 50 | 30 days supply | Qty: 30 | Fill #2

## 2017-06-21 MED FILL — TRETINOIN 0.05% CREAM: 0.05 | 30 days supply | Qty: 45 | Fill #0

## 2017-06-25 ENCOUNTER — Ambulatory Visit: Payer: 59 | Admitting: Internal Medicine

## 2017-06-26 ENCOUNTER — Encounter: Payer: Self-pay | Admitting: Internal Medicine

## 2017-06-27 ENCOUNTER — Encounter: Payer: Self-pay | Admitting: Internal Medicine

## 2017-07-02 MED FILL — ALPRAZolam 0.25 MG TABS: 0.25 | 30 days supply | Qty: 30 | Fill #1

## 2017-07-09 ENCOUNTER — Encounter: Payer: Self-pay | Admitting: Internal Medicine

## 2017-07-09 ENCOUNTER — Ambulatory Visit (INDEPENDENT_AMBULATORY_CARE_PROVIDER_SITE_OTHER): Payer: 59 | Admitting: Internal Medicine

## 2017-07-09 VITALS — BP 126/76 | HR 70 | Ht 62.0 in | Wt 187.0 lb

## 2017-07-09 VITALS — BP 98/76 | HR 81 | Temp 98.2°F | Resp 13 | Ht 62.0 in | Wt 191.0 lb

## 2017-07-09 DIAGNOSIS — R55 Syncope and collapse: Secondary | ICD-10-CM | POA: Diagnosis not present

## 2017-07-09 DIAGNOSIS — G4733 Obstructive sleep apnea (adult) (pediatric): Secondary | ICD-10-CM | POA: Diagnosis not present

## 2017-07-09 DIAGNOSIS — F419 Anxiety disorder, unspecified: Secondary | ICD-10-CM | POA: Diagnosis not present

## 2017-07-09 DIAGNOSIS — I471 Supraventricular tachycardia: Secondary | ICD-10-CM

## 2017-07-09 DIAGNOSIS — K219 Gastro-esophageal reflux disease without esophagitis: Secondary | ICD-10-CM | POA: Diagnosis present

## 2017-07-09 DIAGNOSIS — I10 Essential (primary) hypertension: Secondary | ICD-10-CM | POA: Diagnosis not present

## 2017-07-09 DIAGNOSIS — K3189 Other diseases of stomach and duodenum: Secondary | ICD-10-CM

## 2017-07-09 DIAGNOSIS — M797 Fibromyalgia: Secondary | ICD-10-CM | POA: Diagnosis not present

## 2017-07-09 DIAGNOSIS — Z9884 Bariatric surgery status: Secondary | ICD-10-CM | POA: Diagnosis not present

## 2017-07-09 DIAGNOSIS — K319 Disease of stomach and duodenum, unspecified: Secondary | ICD-10-CM

## 2017-07-09 DIAGNOSIS — Z760 Encounter for issue of repeat prescription: Secondary | ICD-10-CM | POA: Diagnosis not present

## 2017-07-09 MED ORDER — SODIUM CHLORIDE 0.9 % IV SOLN: 500.0000 mL | Freq: Once | INTRAVENOUS | Status: DC

## 2017-07-09 MED ORDER — ONDANSETRON HCL 4 MG PO TABS: 4.0000 mg | ORAL_TABLET | ORAL | 0 refills | Status: DC | PRN

## 2017-07-09 MED FILL — ONDANSETRON HCL 4 MG TABLET: 4 | 2 days supply | Qty: 10 | Fill #0

## 2017-07-09 NOTE — Progress Notes (Signed)
To PACU, vss patent aw report to rn 

## 2017-07-09 NOTE — Op Note (Signed)
[facility] Patient Name: Cathy Guerrero Procedure Date: 07/09/2017 3:02 PM MRN: [id number] Endoscopist: Docia Chuck. Henrene Pastor , MD Age: 42 Referring MD:  Date of Birth: February 25, 1976 Gender: Female Account #: [id number] Procedure:                Upper GI endoscopy, with biopsies Indications:              Esophageal reflux Medicines:                Monitored Anesthesia Care Procedure:                Pre-Anesthesia Assessment:                           - Prior to the procedure, a History and Physical                            was performed, and patient medications and                            allergies were reviewed. The patient's tolerance of                            previous anesthesia was also reviewed. The risks                            and benefits of the procedure and the sedation                            options and risks were discussed with the patient.                            All questions were answered, and informed consent                            was obtained. Prior Anticoagulants: The patient has                            taken no previous anticoagulant or antiplatelet                            agents. ASA Grade Assessment: II - A patient with                            mild systemic disease. After reviewing the risks                            and benefits, the patient was deemed in                            satisfactory condition to undergo the procedure.                           After obtaining informed consent, the endoscope was  passed under direct vision. Throughout the                            procedure, the patient's blood pressure, pulse, and                            oxygen saturations were monitored continuously. The                            Endoscope was introduced through the mouth, and                            advanced to the second part of duodenum. The upper                            GI endoscopy was  accomplished without difficulty.                            The patient tolerated the procedure well. Scope In: Scope Out: Findings:                 The esophagus was normal.                           The stomach revealed evidence of prior sleeve                            gastrectomy. In the antrum was a 15 mm mass (see                            photograph) which was biopsied. As well was a 5 mm                            prepyloric nodule was also biopsied.                           The examined duodenum was normal.                           The cardia and gastric fundus were normal on                            retroflexion. Complications:            No immediate complications. Estimated Blood Loss:     Estimated blood loss: none. Impression:               1. Status post sleeve gastrectomy                           2. Normal esophagus                           3. Prepyloric nodule and antral mass biopsied. Recommendation:           1 Reflux precautions  2. Continue pantoprazole                           3. Follow up biopsies. Dr. Henrene Pastor will send you a                            letter with the results and further recommendations                            as indicated                           4. Routine GI follow-up one year. Sooner if needed. Docia Chuck. Henrene Pastor, MD 07/09/2017 3:39:05 PM This report has been signed electronically.

## 2017-07-09 NOTE — Progress Notes (Signed)
Called to room to assist during endoscopic procedure.  Patient ID and intended procedure confirmed with present staff. Received instructions for my participation in the procedure from the performing physician.  

## 2017-07-09 NOTE — Progress Notes (Signed)
Per Thurston Pounds, RN Per Dr. Henrene Pastor send in zofran 4 mg #10 1 po q4h prn nausea and vomiting.  No refill sent to [facility].

## 2017-07-09 NOTE — Progress Notes (Signed)
On March 26,2019 patient had a Cardiac Ablation for SVT.

## 2017-07-09 NOTE — Patient Instructions (Signed)
Medication Instructions:  Your physician recommends that you continue on your current medications as directed. Please refer to the Current Medication list given to you today.  Labwork: None ordered  Testing/Procedures: None ordered  Follow-Up: No follow up is needed at this time with Dr. Rayann Heman.  He will see you on an as needed basis.  * If you need a refill on your cardiac medications before your next appointment, please call your pharmacy.   *Please note that any paperwork needing to be filled out by the provider will need to be addressed at the front desk prior to seeing the provider. Please note that any FMLA, disability or other documents regarding health condition is subject to a $25.00 charge that must be received prior to completion of paperwork in the form of a money order or check.  Thank you for choosing [facility]!!

## 2017-07-09 NOTE — Progress Notes (Signed)
4 mg zofran given iv at 1510

## 2017-07-09 NOTE — Progress Notes (Signed)
PCP: McLean-Scocuzza, Nino Glow, MD Primary Cardiologist: Dr Fletcher Anon Primary EP: Dr Rayann Heman  Golden Pop is a 42 y.o. female who presents today for routine electrophysiology followup.  Since her recent SVT ablation, the patient reports doing very well.  Today, she denies symptoms of palpitations, chest pain, shortness of breath,  lower extremity edema, dizziness, presyncope, or syncope.  The patient is otherwise without complaint today.   Past Medical History:  Diagnosis Date  . Anemia   . Anxiety   . Fibromyalgia   . Frequent headaches    Followed by Neurology - in [place] - stress  . GERD (gastroesophageal reflux disease)   . Hypertension 3 years ago  . Ovarian cyst   . Rheumatoid arthritis ([place]) From [facility]    Remission 2 years; saw Dr. Wadie Lessen [place] was on MTX, Humira, Prednisone; normal ESR 8, anti CCP neg, CRP neg, RF negative , ANA neg with h/o +ANA, anti DS DNA 08/2008  . [facility] spotted fever 5 years ago   +IgM serologies   . Sleep apnea    RESOLVED SINCE GASTRIC BYPASS SURGERY IN 2015  . SVT (supraventricular tachycardia) ([place]) Noticed 3 years ago   adenosine sensitive short PR SVT docymented by EKG 4/17  . Syncope and collapse 3 years ago - Happened about 2 times - Last time    Seen by neuro and cardiology - increased heart rate related   Past Surgical History:  Procedure Laterality Date  . APPENDECTOMY  1984  . CHOLECYSTECTOMY N/A 09/01/2014   Procedure: LAPAROSCOPIC CHOLECYSTECTOMY;  Surgeon: Bonner Puna, MD;  Location: [facility] ORS;  Service: General;  Laterality: N/A;  . ENDOMETRIAL ABLATION  2010? Pt unsure  . LAPAROSCOPIC GASTRIC SLEEVE RESECTION    . MOUTH SURGERY    . REDUCTION MAMMAPLASTY Bilateral 2003  . SVT ABLATION N/A 06/05/2017   Procedure: SVT ABLATION;  Surgeon: [place] Grayer, MD;  Location: [facility];  Service: Cardiovascular;  Laterality: N/A;  . TUBAL LIGATION  2001    ROS- all systems are reviewed and negatives except as per  HPI above  Current Outpatient Medications  Medication Sig Dispense Refill  . ALPRAZolam (XANAX) 0.25 MG tablet Take 1 tablet (0.25 mg total) by mouth daily as needed for anxiety. 30 tablet 1  . Biotin 5000 MCG CAPS Take 5,000 mcg by mouth daily.     Marland Kitchen CALCIUM CARBONATE-VITAMIN D PO Take 1 tablet by mouth daily.    . Clindamycin-Benzoyl Per, Refr, gel Apply 1 application topically 4 (four) times a week.  8  . gabapentin (NEURONTIN) 300 MG capsule Take 1 capsule (300 mg total) by mouth at bedtime. 90 capsule 0  . Multiple Vitamin (MULTIVITAMIN) tablet Take 1 tablet by mouth daily.    . norethindrone (MICRONOR,CAMILA,ERRIN) 0.35 MG tablet Take 1 tablet by mouth daily.     . pantoprazole (PROTONIX) 40 MG tablet Take 1 tablet (40 mg total) by mouth daily. 30 min. Before food 90 tablet 1  . sertraline (ZOLOFT) 50 MG tablet Take 1 tablet (50 mg total) by mouth daily. 30 tablet 5  . tretinoin (RETIN-A) 0.05 % cream Apply 1 application topically daily as needed (for face).    . vitamin B-12 (CYANOCOBALAMIN) 100 MCG tablet Take 1,000 mcg by mouth daily.     No current facility-administered medications for this visit.     Physical Exam: Vitals:   07/09/17 1336  BP: 126/76  Pulse: 70  SpO2: 99%  Weight: 187 lb (84.8  kg)  Height: _0  (1.575 m)    GEN- The patient is well appearing, alert and oriented x 3 today.   Head- normocephalic, atraumatic Eyes-  Sclera clear, conjunctiva pink Ears- hearing intact Oropharynx- clear Lungs- Clear to ausculation bilaterally, normal work of breathing Heart- Regular rate and rhythm, no murmurs, rubs or gallops, PMI not laterally displaced GI- soft, NT, ND, + BS Extremities- no clubbing, cyanosis, or edema  EKG tracing ordered today is personally reviewed and shows sinus rhythm 70 bpm, otherwise normal ekg  Assessment and Plan:  1. SVT No episodes post ablation No further workup  Return as needed  Thompson Grayer MD, [facility] 07/09/2017 2:04  PM

## 2017-07-09 NOTE — Patient Instructions (Signed)
YOU HAD AN ENDOSCOPIC PROCEDURE TODAY AT THE [facility]:   Refer to the procedure report that was given to you for any specific questions about what was found during the examination.  If the procedure report does not answer your questions, please call your gastroenterologist to clarify.  If you requested that your care partner not be given the details of your procedure findings, then the procedure report has been included in a sealed envelope for you to review at your convenience later.  YOU SHOULD EXPECT: Some feelings of bloating in the abdomen. Passage of more gas than usual.  Walking can help get rid of the air that was put into your GI tract during the procedure and reduce the bloating. If you had a lower endoscopy (such as a colonoscopy or flexible sigmoidoscopy) you may notice spotting of blood in your stool or on the toilet paper. If you underwent a bowel prep for your procedure, you may not have a normal bowel movement for a few days.  Please Note:  You might notice some irritation and congestion in your nose or some drainage.  This is from the oxygen used during your procedure.  There is no need for concern and it should clear up in a day or so.  SYMPTOMS TO REPORT IMMEDIATELY:   Following upper endoscopy (EGD)  Vomiting of blood or coffee ground material  New chest pain or pain under the shoulder blades  Painful or persistently difficult swallowing  New shortness of breath  Fever of 100F or higher  Black, tarry-looking stools  For urgent or emergent issues, a gastroenterologist can be reached at any hour by calling [phone].   DIET:  We do recommend a small meal at first, but then you may proceed to your regular diet.  Drink plenty of fluids but you should avoid alcoholic beverages for 24 hours.  ACTIVITY:  You should plan to take it easy for the rest of today and you should NOT DRIVE or use heavy machinery until tomorrow (because of the sedation medicines used  during the test).    FOLLOW UP: Our staff will call the number listed on your records the next business day following your procedure to check on you and address any questions or concerns that you may have regarding the information given to you following your procedure. If we do not reach you, we will leave a message.  However, if you are feeling well and you are not experiencing any problems, there is no need to return our call.  We will assume that you have returned to your regular daily activities without incident.  If any biopsies were taken you will be contacted by phone or by letter within the next 1-3 weeks.  Please call us at [phone] if you have not heard about the biopsies in 3 weeks.    SIGNATURES/CONFIDENTIALITY: You and/or your care partner have signed paperwork which will be entered into your electronic medical record.  These signatures attest to the fact that that the information above on your After Visit Summary has been reviewed and is understood.  Full responsibility of the confidentiality of this discharge information lies with you and/or your care-partner. 

## 2017-07-10 ENCOUNTER — Telehealth: Payer: Self-pay | Admitting: *Deleted

## 2017-07-10 LAB — HELICOBACTER PYLORI SCREEN-BIOPSY: UREASE: NEGATIVE

## 2017-07-10 NOTE — Telephone Encounter (Signed)
Left message on voicemail.

## 2017-07-13 MED FILL — SERTRALINE HCL 50 MG TABLET: 50 | 30 days supply | Qty: 30 | Fill #3

## 2017-07-23 ENCOUNTER — Encounter: Payer: Self-pay | Admitting: Internal Medicine

## 2017-07-24 ENCOUNTER — Encounter: Payer: Self-pay | Admitting: Internal Medicine

## 2017-07-24 ENCOUNTER — Ambulatory Visit
Admission: RE | Admit: 2017-07-24 | Discharge: 2017-07-24 | Disposition: A | Discharge status: Home / Self Care | Payer: 59 | Source: Ambulatory Visit | Attending: Internal Medicine | Admitting: Internal Medicine

## 2017-07-24 ENCOUNTER — Ambulatory Visit (INDEPENDENT_AMBULATORY_CARE_PROVIDER_SITE_OTHER): Payer: 59

## 2017-07-24 ENCOUNTER — Ambulatory Visit: Payer: 59 | Admitting: Internal Medicine

## 2017-07-24 VITALS — BP 126/66 | HR 108 | Temp 98.2°F | Ht 62.0 in | Wt 186.9 lb

## 2017-07-24 DIAGNOSIS — Z1231 Encounter for screening mammogram for malignant neoplasm of breast: Secondary | ICD-10-CM | POA: Diagnosis not present

## 2017-07-24 DIAGNOSIS — M79661 Pain in right lower leg: Secondary | ICD-10-CM

## 2017-07-24 DIAGNOSIS — M25562 Pain in left knee: Secondary | ICD-10-CM | POA: Insufficient documentation

## 2017-07-24 DIAGNOSIS — G4700 Insomnia, unspecified: Secondary | ICD-10-CM

## 2017-07-24 DIAGNOSIS — G8929 Other chronic pain: Secondary | ICD-10-CM

## 2017-07-24 DIAGNOSIS — Z23 Encounter for immunization: Secondary | ICD-10-CM

## 2017-07-24 DIAGNOSIS — M5441 Lumbago with sciatica, right side: Secondary | ICD-10-CM | POA: Diagnosis not present

## 2017-07-24 MED ORDER — ESZOPICLONE 2 MG PO TABS: 2.0000 mg | ORAL_TABLET | Freq: Every evening | ORAL | 2 refills | Status: DC | PRN

## 2017-07-24 MED ORDER — METHYLPREDNISOLONE ACETATE 40 MG/ML IJ SUSP
40.0000 mg | Freq: Once | INTRAMUSCULAR | Status: AC
  Administered 2017-07-24: 40 mg via INTRAMUSCULAR

## 2017-07-24 NOTE — Patient Instructions (Addendum)
Let me know about chiropractor  Please go to [facility] for Ultrasound right leg  F/u in 2-3 months sooner if needed   Back Pain, Adult Many adults have back pain from time to time. Common causes of back pain include:  A strained muscle or ligament.  Wear and tear (degeneration) of the spinal disks.  Arthritis.  A hit to the back.  Back pain can be short-lived (acute) or last a long time (chronic). A physical exam, lab tests, and imaging studies may be done to find the cause of your pain. Follow these instructions at home: Managing pain and stiffness  Take over-the-counter and prescription medicines only as told by your health care provider.  If directed, apply heat to the affected area as often as told by your health care provider. Use the heat source that your health care provider recommends, such as a moist heat pack or a heating pad. ? Place a towel between your skin and the heat source. ? Leave the heat on for 20-30 minutes. ? Remove the heat if your skin turns bright red. This is especially important if you are unable to feel pain, heat, or cold. You have a greater risk of getting burned.  If directed, apply ice to the injured area: ? Put ice in a plastic bag. ? Place a towel between your skin and the bag. ? Leave the ice on for 20 minutes, 2-3 times a day for the first 2-3 days. Activity  Do not stay in bed. Resting more than 1-2 days can delay your recovery.  Take short walks on even surfaces as soon as you are able. Try to increase the length of time you walk each day.  Do not sit, drive, or stand in one place for more than 30 minutes at a time. Sitting or standing for long periods of time can put stress on your back.  Use proper lifting techniques. When you bend and lift, use positions that put less stress on your back: ? [place] your knees. ? Keep the load close to your body. ? Avoid twisting.  Exercise regularly as told by your health care provider. Exercising will help  your back heal faster. This also helps prevent back injuries by keeping muscles strong and flexible.  Your health care provider may recommend that you see a physical therapist. This person can help you come up with a safe exercise program. Do any exercises as told by your physical therapist. Lifestyle  Maintain a healthy weight. Extra weight puts stress on your back and makes it difficult to have good posture.  Avoid activities or situations that make you feel anxious or stressed. Learn ways to manage anxiety and stress. One way to manage stress is through exercise. Stress and anxiety increase muscle tension and can make back pain worse. General instructions  Sleep on a firm mattress in a comfortable position. Try lying on your side with your knees slightly bent. If you lie on your back, put a pillow under your knees.  Follow your treatment plan as told by your health care provider. This may include: ? Cognitive or behavioral therapy. ? Acupuncture or massage therapy. ? Meditation or yoga. Contact a health care provider if:  You have pain that is not relieved with rest or medicine.  You have increasing pain going down into your legs or buttocks.  Your pain does not improve in 2 weeks.  You have pain at night.  You lose weight.  You have a fever or chills.  Get help right away if:  You develop new bowel or bladder control problems.  You have unusual weakness or numbness in your arms or legs.  You develop nausea or vomiting.  You develop abdominal pain.  You feel faint. Summary  Many adults have back pain from time to time. A physical exam, lab tests, and imaging studies may be done to find the cause of your pain.  Use proper lifting techniques. When you bend and lift, use positions that put less stress on your back.  Take over-the-counter and prescription medicines and apply heat or ice as directed by your health care provider. This information is not intended to replace  advice given to you by your health care provider. Make sure you discuss any questions you have with your health care provider. Document Released: 02/27/2005 Document Revised: 04/03/2016 Document Reviewed: 04/03/2016 Elsevier Interactive Patient Education  [employer].

## 2017-07-24 NOTE — Progress Notes (Addendum)
Chief Complaint  Patient presents with  . Follow-up   F/u  1. C/o insomnia was on lunesta 1 mg qhs and wanted to try again higher dose  2. C/o low back pain and right hip pain and right lower leg pain h/o inactivity x 10 days after surgery. Right lower leg pain with mild right leg swelling 4/10 at times and max 6/10 with standing. She has tried NSAIDs, Tylenol, essential oil, ice, heat aspercream, biofreeze w/o relief. No h/o trauma  She maybe interested in chiropractor in [place] or [place] reviewed Xray 2015 low back with mild DDD 3. Wants to sch mammogram    Review of Systems  Constitutional: Negative for weight loss.  HENT: Negative for hearing loss.   Eyes: Negative for blurred vision.  Respiratory: Negative for shortness of breath.   Cardiovascular: Positive for leg swelling. Negative for palpitations.  Gastrointestinal: Negative for abdominal pain.  Musculoskeletal: Positive for back pain.  Skin: Negative for rash.  Psychiatric/Behavioral: Negative for depression. The patient has insomnia.    Past Medical History:  Diagnosis Date  . Anemia   . Anxiety   . Fibromyalgia   . Frequent headaches    Followed by Neurology - in [place] - stress  . GERD (gastroesophageal reflux disease)   . Hypertension 3 years ago  . Ovarian cyst   . Rheumatoid arthritis ([employer]) From RMSF    Remission 2 years; saw Dr. Wadie Lessen [place] was on MTX, Humira, Prednisone; normal ESR 8, anti CCP neg, CRP neg, RF negative , ANA neg with h/o +ANA, anti DS DNA 08/2008  . [facility] spotted fever 5 years ago   +IgM serologies   . Sleep apnea    RESOLVED SINCE GASTRIC BYPASS SURGERY IN 2015  . SVT (supraventricular tachycardia) ([place]) Noticed 3 years ago   adenosine sensitive short PR SVT docymented by EKG 4/17  . Syncope and collapse 3 years ago - Happened about 2 times - Last time    Seen by neuro and cardiology - increased heart rate related   Past Surgical History:  Procedure Laterality Date  .  APPENDECTOMY  1984  . CARDIAC ELECTROPHYSIOLOGY MAPPING AND ABLATION N/A 06/05/2017  . CHOLECYSTECTOMY N/A 09/01/2014   Procedure: LAPAROSCOPIC CHOLECYSTECTOMY;  Surgeon: Bonner Puna, MD;  Location: [facility] ORS;  Service: General;  Laterality: N/A;  . ENDOMETRIAL ABLATION  2010? Pt unsure  . LAPAROSCOPIC GASTRIC SLEEVE RESECTION    . MOUTH SURGERY    . REDUCTION MAMMAPLASTY Bilateral 2003  . SVT ABLATION N/A 06/05/2017   Procedure: SVT ABLATION;  Surgeon: Thompson Grayer, MD;  Location: [place] CV LAB;  Service: Cardiovascular;  Laterality: N/A;  . TUBAL LIGATION  2001   Family History  Problem Relation Age of Onset  . Heart disease Mother   . Heart attack Mother   . Stroke Mother   . Hypertension Mother   . Hyperlipidemia Mother   . Arthritis Mother        Rheumatoid arthritis  . Kidney disease Mother   . Depression Mother   . Diabetes Mother   . Heart disease Father   . Heart attack Father   . Hypertension Father   . Hyperlipidemia Father   . Kidney disease Father   . Diabetes Father   . COPD Father   . Heart attack Maternal Grandmother   . Breast cancer Maternal Grandmother   . Arthritis Maternal Grandfather        rheumatoid arthritis  . Diabetes Maternal Grandfather   .  Ovarian cancer Sister        1/2 sister  . Uterine cancer Sister        1/2 sister   Social History   Socioeconomic History  . Marital status: Married    Spouse name: Donnie  . Number of children: 3  . Years of education: 7  . Highest education level: Not on file  Occupational History  . Occupation: Microbiologist: [employer]  Social Needs  . Financial resource strain: Not on file  . Food insecurity:    Worry: Not on file    Inability: Not on file  . Transportation needs:    Medical: Not on file    Non-medical: Not on file  Tobacco Use  . Smoking status: Never Smoker  . Smokeless tobacco: Never Used  Substance and Sexual Activity  . Alcohol use: No  . Drug  use: No  . Sexual activity: Yes    Birth control/protection: Other-see comments    Comment: Ablation  Lifestyle  . Physical activity:    Days per week: Not on file    Minutes per session: Not on file  . Stress: Not on file  Relationships  . Social connections:    Talks on phone: Not on file    Gets together: Not on file    Attends religious service: Not on file    Active member of club or organization: Not on file    Attends meetings of clubs or organizations: Not on file    Relationship status: Not on file  . Intimate partner violence:    Fear of current or ex partner: Not on file    Emotionally abused: Not on file    Physically abused: Not on file    Forced sexual activity: Not on file  Other Topics Concern  . Not on file  Social History Narrative   Born in raised in [place], [place]. Currently resides in [place], [place]. Lives with husband (married since age 71 y.o), Donnie, and 3 children. 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 Enjoys camping, shopping, reading, and involved in church. Likes to ride motorcycle with husband and wears helmet (as of 01/2017 sold motorcycle)   Current Meds  Medication Sig  . ALPRAZolam (XANAX) 0.25 MG tablet Take 1 tablet (0.25 mg total) by mouth daily as needed for anxiety.  . Biotin 5000 MCG CAPS Take 5,000 mcg by mouth daily.   Marland Kitchen CALCIUM CARBONATE-VITAMIN D PO Take 1 tablet by mouth daily.  . Cetirizine HCl 10 MG TBDP Take by mouth.  . Clindamycin-Benzoyl Per, Refr, gel Apply 1 application topically 4 (four) times a week.  . gabapentin (NEURONTIN) 300 MG capsule Take 1 capsule (300 mg total) by mouth at bedtime.  . Multiple Vitamin (MULTIVITAMIN) tablet Take 1 tablet by mouth daily.  . norethindrone (MICRONOR,CAMILA,ERRIN) 0.35 MG tablet Take 1 tablet by mouth daily.   . ondansetron (ZOFRAN) 4 MG tablet Take 1 tablet (4 mg total) by mouth every 4 (four) hours as needed for nausea or vomiting.  . pantoprazole  (PROTONIX) 40 MG tablet Take 1 tablet (40 mg total) by mouth daily. 30 min. Before food  . sertraline (ZOLOFT) 50 MG tablet Take 1 tablet (50 mg total) by mouth daily.  Marland Kitchen tretinoin (RETIN-A) 0.05 % cream Apply 1 application topically daily as needed (for face).  . vitamin B-12 (CYANOCOBALAMIN) 100 MCG tablet Take 1,000 mcg by mouth daily.   Current Facility-Administered Medications for the  07/24/17 encounter (Office Visit) with McLean-Scocuzza, Nino Glow, MD  Medication  . 0.9 %  sodium chloride infusion   Allergies  Allergen Reactions  . Amoxicillin Hives  . Other Other (See Comments)    novacaine   . Enbrel [Etanercept] Hives and Rash   Recent Results (from the past 2160 hour(s))  Sedimentation rate     Status: None   Collection Time: 05/11/17 11:10 AM  Result Value Ref Range   Sed Rate 8 0 - 20 mm/hr  Comprehensive metabolic panel     Status: None   Collection Time: 05/11/17 11:10 AM  Result Value Ref Range   Sodium 143 135 - 145 mEq/L   Potassium 3.6 3.5 - 5.1 mEq/L   Chloride 106 96 - 112 mEq/L   CO2 29 19 - 32 mEq/L   Glucose, Bld 91 70 - 99 mg/dL   BUN 12 6 - 23 mg/dL   Creatinine, Ser 0.77 0.40 - 1.20 mg/dL   Total Bilirubin 0.2 0.2 - 1.2 mg/dL   Alkaline Phosphatase 61 39 - 117 U/L   AST 12 0 - 37 U/L   ALT 15 0 - 35 U/L   Total Protein 7.1 6.0 - 8.3 g/dL   Albumin 3.8 3.5 - 5.2 g/dL   Calcium 9.0 8.4 - 10.5 mg/dL   GFR 86.89 >60.00 mL/min  CBC with Differential/Platelet     Status: Abnormal   Collection Time: 05/11/17 11:10 AM  Result Value Ref Range   WBC 8.3 4.0 - 10.5 K/uL   RBC 4.15 3.87 - 5.11 Mil/uL   Hemoglobin 12.1 12.0 - 15.0 g/dL   HCT 35.4 (L) 36.0 - 46.0 %   MCV 85.2 78.0 - 100.0 fl   MCHC 34.2 30.0 - 36.0 g/dL   RDW 13.2 11.5 - 15.5 %   Platelets 286.0 150.0 - 400.0 K/uL   Neutrophils Relative % 49.6 43.0 - 77.0 %   Lymphocytes Relative 40.4 12.0 - 46.0 %   Monocytes Relative 6.9 3.0 - 12.0 %   Eosinophils Relative 2.6 0.0 - 5.0 %   Basophils  Relative 0.5 0.0 - 3.0 %   Neutro Abs 4.1 1.4 - 7.7 K/uL   Lymphs Abs 3.4 0.7 - 4.0 K/uL   Monocytes Absolute 0.6 0.1 - 1.0 K/uL   Eosinophils Absolute 0.2 0.0 - 0.7 K/uL   Basophils Absolute 0.0 0.0 - 0.1 K/uL  Basic Metabolic Panel (BMET)     Status: Abnormal   Collection Time: 05/25/17 11:20 AM  Result Value Ref Range   Glucose 110 (H) 65 - 99 mg/dL   BUN 11 6 - 24 mg/dL   Creatinine, Ser 0.71 0.57 - 1.00 mg/dL   GFR calc non Af Amer 105 >59 mL/min/1.73   GFR calc Af Amer 121 >59 mL/min/1.73   BUN/Creatinine Ratio 15 9 - 23   Sodium 140 134 - 144 mmol/L   Potassium 3.9 3.5 - 5.2 mmol/L   Chloride 102 96 - 106 mmol/L   CO2 23 20 - 29 mmol/L   Calcium 9.0 8.7 - 10.2 mg/dL  CBC w/Diff     Status: None   Collection Time: 05/25/17 11:20 AM  Result Value Ref Range   WBC 7.5 3.4 - 10.8 x10E3/uL   RBC 4.05 3.77 - 5.28 x10E6/uL   Hemoglobin 11.9 11.1 - 15.9 g/dL   Hematocrit 34.2 34.0 - 46.6 %   MCV 84 79 - 97 fL   MCH 29.4 26.6 - 33.0 pg   MCHC 34.8 31.5 -  35.7 g/dL   RDW 13.4 12.3 - 15.4 %   Platelets 288 150 - 379 x10E3/uL   Neutrophils 53 Not Estab. %   Lymphs 38 Not Estab. %   Monocytes 7 Not Estab. %   Eos 2 Not Estab. %   Basos 0 Not Estab. %   Neutrophils Absolute 4.1 1.4 - 7.0 x10E3/uL   Lymphocytes Absolute 2.8 0.7 - 3.1 x10E3/uL   Monocytes Absolute 0.5 0.1 - 0.9 x10E3/uL   EOS (ABSOLUTE) 0.1 0.0 - 0.4 x10E3/uL   Basophils Absolute 0.0 0.0 - 0.2 x10E3/uL   Immature Granulocytes 0 Not Estab. %   Immature Grans (Abs) 0.0 0.0 - 0.1 [id number]  Helicobacter pylori screen-biopsy     Status: None   Collection Time: 07/09/17  3:30 PM  Result Value Ref Range   UREASE Negative Negative   Objective  Body mass index is 34.18 kg/m. Wt Readings from Last 3 Encounters:  07/24/17 186 lb 14.4 oz (84.8 kg)  07/09/17 191 lb (86.6 kg)  07/09/17 187 lb (84.8 kg)   Temp Readings from Last 3 Encounters:  07/24/17 98.2 F (36.8 C) (Oral)  07/09/17 98.2 F (36.8 C)  06/05/17  98.6 F (37 C) (Oral)   BP Readings from Last 3 Encounters:  07/24/17 126/66  07/09/17 98/76  07/09/17 126/76   Pulse Readings from Last 3 Encounters:  07/24/17 (!) 108  07/09/17 81  07/09/17 70    Physical Exam  Constitutional: She is oriented to person, place, and time. Vital signs are normal. She appears well-developed and well-nourished. She is cooperative.  HENT:  Head: Normocephalic and atraumatic.  Mouth/Throat: Oropharynx is clear and moist and mucous membranes are normal.  Eyes: Pupils are equal, round, and reactive to light. Conjunctivae are normal.  Cardiovascular: Normal rate, regular rhythm and normal heart sounds.  Pulmonary/Chest: Effort normal and breath sounds normal.  Neurological: She is alert and oriented to person, place, and time. Gait normal.  Skin: Skin is warm, dry and intact.  Mild edema right lower ext today   Psychiatric: She has a normal mood and affect. Her speech is normal and behavior is normal. Judgment and thought content normal. Cognition and memory are normal.  Nursing note and vitals reviewed.   Assessment   1. Insomnia  2. Right lower leg pain and mild edema r/o DVT vs MSK  3. HM  4. Low back pain with right sciatica 2015 Xray with mild DDD Plan  1. Add lunesta 2 mg qhs prn  2. Xray tib/fib and [place] rle  3.  Had flu shot 11/2016  Tdap today   Declines HPV vx  Hep B immune  Pap had 02/23/16 neg no h/o abnormal pap except 1991 possibly related to OCP use but resolved per pt due for repeat in 02/23/2019   Mammo due 08/01/17 referred [facility]  Obtained notes from [facility]ology and arthritis center (Dr. Scarlette Shorts)  Saw dermatology near [place] in [place] in 2019   4.  H/o mild DDD 2015 low back Xray  Pt considering chiropractor hold on PT for now  Depo 40  Prn otc meds   Consider check B12 in future and iron and vitamin D if not checked   Provider: Dr. Olivia Mackie McLean-Scocuzza-Internal Medicine

## 2017-07-24 NOTE — Progress Notes (Signed)
Pre visit review using our clinic review tool, if applicable. No additional management support is needed unless otherwise documented below in the visit note. 

## 2017-07-25 ENCOUNTER — Encounter: Payer: Self-pay | Admitting: *Deleted

## 2017-07-25 DIAGNOSIS — Z760 Encounter for issue of repeat prescription: Secondary | ICD-10-CM | POA: Diagnosis not present

## 2017-07-27 ENCOUNTER — Telehealth: Payer: Self-pay | Admitting: Internal Medicine

## 2017-07-27 NOTE — Telephone Encounter (Signed)
Copied from [place] [id number]. Topic: Quick Communication - Rx Refill/Question >> Jul 27, 2017  7:44 AM Scherrie Gerlach wrote: Medication: eszopiclone (LUNESTA) 2 MG TABS tablet Has the patient contacted their pharmacy? Yes Pt following up on this Rx .  Status states transmission failed Please resend asap thanks  [place], [place].

## 2017-07-30 ENCOUNTER — Other Ambulatory Visit: Payer: Self-pay

## 2017-07-30 ENCOUNTER — Encounter: Payer: Self-pay | Admitting: Internal Medicine

## 2017-07-30 DIAGNOSIS — G4700 Insomnia, unspecified: Secondary | ICD-10-CM

## 2017-07-30 MED ORDER — ESZOPICLONE 2 MG PO TABS: 2.0000 mg | ORAL_TABLET | Freq: Every evening | ORAL | 2 refills | Status: DC | PRN

## 2017-07-30 MED FILL — ESZOPICLONE 2 MG TAB: 2 | 30 days supply | Qty: 30 | Fill #0

## 2017-07-30 NOTE — Telephone Encounter (Signed)
Pt needs to have this filled, she needs to have it done today so that she can get the rx. She works at [employer] and can only get meds on certain days cb is [phone]

## 2017-07-30 NOTE — Telephone Encounter (Signed)
Rx has been resent to [place].

## 2017-07-30 NOTE — Telephone Encounter (Signed)
I resent it via epic

## 2017-07-30 NOTE — Telephone Encounter (Signed)
Signed was this faxed ?  [place]

## 2017-08-05 ENCOUNTER — Other Ambulatory Visit: Payer: Self-pay | Admitting: Internal Medicine

## 2017-08-05 DIAGNOSIS — G8929 Other chronic pain: Secondary | ICD-10-CM

## 2017-08-05 DIAGNOSIS — M25561 Pain in right knee: Principal | ICD-10-CM

## 2017-08-08 ENCOUNTER — Telehealth: Payer: Self-pay | Admitting: Internal Medicine

## 2017-08-08 MED FILL — PANTOPRAZOLE SOD DR 40 MG T: 40 | 90 days supply | Qty: 90 | Fill #1

## 2017-08-08 NOTE — Telephone Encounter (Unsigned)
Copied from [place] [id number]. Topic: Quick Communication - Rx Refill/Question >> Aug 08, 2017  5:17 PM Neva Seat wrote: pantoprazole (PROTONIX) 40 MG tablet ALPRAZolam [name] 0.25 MG tablet  Needing refills  [place], [place] - [address] [facility] [place] [address] Phone: [phone] Fax: [phone]

## 2017-08-09 ENCOUNTER — Other Ambulatory Visit: Payer: Self-pay | Admitting: Internal Medicine

## 2017-08-09 ENCOUNTER — Telehealth: Payer: Self-pay

## 2017-08-09 ENCOUNTER — Other Ambulatory Visit: Payer: Self-pay

## 2017-08-09 DIAGNOSIS — K219 Gastro-esophageal reflux disease without esophagitis: Secondary | ICD-10-CM

## 2017-08-09 DIAGNOSIS — F419 Anxiety disorder, unspecified: Secondary | ICD-10-CM

## 2017-08-09 MED ORDER — ALPRAZOLAM 0.25 MG PO TABS: 0.2500 mg | ORAL_TABLET | Freq: Every day | ORAL | 2 refills | Status: DC | PRN

## 2017-08-09 MED ORDER — PANTOPRAZOLE SODIUM 40 MG PO TBEC: 40.0000 mg | DELAYED_RELEASE_TABLET | Freq: Every day | ORAL | 1 refills | Status: DC

## 2017-08-09 MED FILL — ALPRAZolam 0.25 MG TABS: 0.25 | 30 days supply | Qty: 30 | Fill #0

## 2017-08-09 NOTE — Telephone Encounter (Signed)
Copied from [place] [id number]. Topic: Quick Communication - See Telephone Encounter >> Aug 08, 2017  5:19 PM Neva Seat wrote: Pap smear date keeps alerting her on MyChart. Please update MyChart pap smear date to 02-23-16.

## 2017-08-10 ENCOUNTER — Encounter: Payer: Self-pay | Admitting: Internal Medicine

## 2017-08-10 NOTE — Telephone Encounter (Signed)
Updated.

## 2017-08-13 MED FILL — NORETHINDRONE 0.35 MG TAB: 0.35 | 84 days supply | Qty: 84 | Fill #1

## 2017-08-15 MED FILL — SERTRALINE HCL 50 MG TABLET: 50 | 30 days supply | Qty: 30 | Fill #4

## 2017-08-23 ENCOUNTER — Encounter: Payer: Self-pay | Admitting: Internal Medicine

## 2017-08-29 ENCOUNTER — Other Ambulatory Visit: Payer: Self-pay | Admitting: Internal Medicine

## 2017-08-29 MED FILL — ESZOPICLONE 2 MG TAB: 2 | 30 days supply | Qty: 30 | Fill #1

## 2017-08-30 ENCOUNTER — Ambulatory Visit (INDEPENDENT_AMBULATORY_CARE_PROVIDER_SITE_OTHER): Payer: 59 | Admitting: Orthopaedic Surgery

## 2017-08-30 ENCOUNTER — Ambulatory Visit (INDEPENDENT_AMBULATORY_CARE_PROVIDER_SITE_OTHER): Payer: 59

## 2017-08-30 ENCOUNTER — Other Ambulatory Visit: Payer: Self-pay | Admitting: Internal Medicine

## 2017-08-30 ENCOUNTER — Encounter (INDEPENDENT_AMBULATORY_CARE_PROVIDER_SITE_OTHER): Payer: Self-pay | Admitting: Orthopaedic Surgery

## 2017-08-30 DIAGNOSIS — M25562 Pain in left knee: Secondary | ICD-10-CM

## 2017-08-30 MED ORDER — TRAMADOL HCL 50 MG PO TABS: ORAL_TABLET | ORAL | 0 refills | Status: DC

## 2017-08-30 MED FILL — traMADol HCL 50 MG TABS: 50 | 4 days supply | Qty: 30 | Fill #0

## 2017-08-30 NOTE — Progress Notes (Signed)
Office Visit Note   Patient: Cathy Guerrero           Date of Birth: August 22, 1975           MRN: [id number] Visit Date: 08/30/2017              Requested by: McLean-Scocuzza, Nino Glow, MD [place], [place] [id number] PCP: McLean-Scocuzza, Nino Glow, MD   Assessment & Plan: Visit Diagnoses:  1. Acute pain of left knee     Plan: Impression is left knee degenerative medial meniscus tear.  At this point, we will go ahead and obtain an MRI to further assess this.  She will follow-up with [place] once this is been completed.  Call with concerns or questions in the meantime.  Follow-Up Instructions: Return in about 2 weeks (around 09/13/2017).   Orders:  Orders Placed This Encounter  Procedures  . XR KNEE 3 VIEW LEFT   No orders of the defined types were placed in this encounter.     Procedures: No procedures performed   Clinical Data: No additional findings.   Subjective: Chief Complaint  Patient presents with  . Left Knee - Pain    HPI patient is a pleasant 42 year old female who presents to our clinic today with left knee pain.  This began approximately 3 weeks ago without any known injury or change in activity.  It worsened about 2 weeks ago when twisting her left knee.  The pain she has is to the anteromedial aspect.  She describes this as a constant dull ache.  She does get sharp shooting pains when squatting.  No locking, catching or instability.  She has taken Tylenol without relief of symptoms.  She is unable to take any NSAIDs due to recent stomach surgery.  No numbness, tingling or burning.  No previous injury to the left knee.  Of note, she does have a history of rheumatoid arthritis but has been in remission for the past 6 years and is not currently on any medications for this.  Review of Systems as detailed in HPI.  All others reviewed and are negative.   Objective: Vital Signs: There were no vitals taken for this visit.  Physical Exam well-developed  well-nourished female in no acute distress.  Alert and oriented x3.  Ortho Exam examination of the left knee shows no effusion.  Range of motion 0 to 115 degrees.  Exquisite tenderness medial joint line.  No patellofemoral crepitus.  Collaterals are stable.  Specialty Comments:  No specialty comments available.  Imaging: Xr Knee 3 View Left  Result Date: 08/30/2017 Decreased joint space medial compartment    PMFS History: Patient Active Problem List   Diagnosis Date Noted  . Acute pain of left knee 07/24/2017  . Chronic right-sided low back pain with right-sided sciatica 07/24/2017  . SVT (supraventricular tachycardia) ([place]) 06/05/2017  . Uterus, adenomyosis 04/04/2017  . Paroxysmal SVT (supraventricular tachycardia) ([place]) 03/30/2017  . Chronic abdominal pain 03/30/2017  . Anxiety 02/15/2017  . Depression, major, single episode, mild ([place]) 02/15/2017  . Heart murmur 02/15/2017  . GERD (gastroesophageal reflux disease) 02/18/2016  . Insomnia 02/18/2016  . RLS (restless legs syndrome) 06/30/2014  . Obesity 08/12/2013  . Hypertension    Past Medical History:  Diagnosis Date  . Anemia   . Anxiety   . Fibromyalgia   . Frequent headaches    Followed by Neurology - in [place] - stress  . GERD (gastroesophageal reflux disease)   . Hypertension 3 years  ago  . Ovarian cyst   . Rheumatoid arthritis ([place]) From [facility]    Remission 2 years; saw Dr. Wadie Lessen [place] was on MTX, Humira, Prednisone; normal ESR 8, anti CCP neg, CRP neg, RF negative , ANA neg with h/o +ANA, anti DS DNA 08/2008  . [facility] spotted fever 5 years ago   +IgM serologies   . Sleep apnea    RESOLVED SINCE GASTRIC BYPASS SURGERY IN 2015  . SVT (supraventricular tachycardia) ([place]) Noticed 3 years ago   adenosine sensitive short PR SVT docymented by EKG 4/17  . Syncope and collapse 3 years ago - Happened about 2 times - Last time    Seen by neuro and cardiology - increased heart rate related    Family  History  Problem Relation Age of Onset  . Heart disease Mother   . Heart attack Mother   . Stroke Mother   . Hypertension Mother   . Hyperlipidemia Mother   . Arthritis Mother        Rheumatoid arthritis  . Kidney disease Mother   . Depression Mother   . Diabetes Mother   . Heart disease Father   . Heart attack Father   . Hypertension Father   . Hyperlipidemia Father   . Kidney disease Father   . Diabetes Father   . COPD Father   . Heart attack Maternal Grandmother   . Breast cancer Maternal Grandmother   . Arthritis Maternal Grandfather        rheumatoid arthritis  . Diabetes Maternal Grandfather   . Ovarian cancer Sister        1/2 sister  . Uterine cancer Sister        1/2 sister    Past Surgical History:  Procedure Laterality Date  . APPENDECTOMY  1984  . CARDIAC ELECTROPHYSIOLOGY MAPPING AND ABLATION N/A 06/05/2017  . CHOLECYSTECTOMY N/A 09/01/2014   Procedure: LAPAROSCOPIC CHOLECYSTECTOMY;  Surgeon: Bonner Puna, MD;  Location: [facility] ORS;  Service: General;  Laterality: N/A;  . ENDOMETRIAL ABLATION  2010? Pt unsure  . LAPAROSCOPIC GASTRIC SLEEVE RESECTION    . MOUTH SURGERY    . REDUCTION MAMMAPLASTY Bilateral 2003  . SVT ABLATION N/A 06/05/2017   Procedure: SVT ABLATION;  Surgeon: Thompson Grayer, MD;  Location: [place] CV LAB;  Service: Cardiovascular;  Laterality: N/A;  . TUBAL LIGATION  2001   Social History   Occupational History  . Occupation: Respiratory Therapist    Employer: [employer]  Tobacco Use  . Smoking status: Never Smoker  . Smokeless tobacco: Never Used  Substance and Sexual Activity  . Alcohol use: No  . Drug use: No  . Sexual activity: Yes    Birth control/protection: Other-see comments    Comment: Ablation

## 2017-08-30 NOTE — Telephone Encounter (Signed)
Patient was last seen by Dr. Aundra Dubin on 07-24-17 this medication was filled by Dr. Derrel Nip on 05-31-17 her next appt is 10-16-17. Would she like to refill?

## 2017-08-31 MED FILL — GABAPENTIN 300 MG CAPSULE: 300 | 90 days supply | Qty: 90 | Fill #0

## 2017-09-11 ENCOUNTER — Encounter (INDEPENDENT_AMBULATORY_CARE_PROVIDER_SITE_OTHER): Payer: Self-pay | Admitting: Orthopaedic Surgery

## 2017-09-11 ENCOUNTER — Ambulatory Visit (INDEPENDENT_AMBULATORY_CARE_PROVIDER_SITE_OTHER): Payer: 59 | Admitting: Orthopaedic Surgery

## 2017-09-11 ENCOUNTER — Telehealth: Payer: Self-pay | Admitting: Internal Medicine

## 2017-09-11 VITALS — Ht 62.0 in | Wt 186.0 lb

## 2017-09-11 DIAGNOSIS — M25562 Pain in left knee: Secondary | ICD-10-CM | POA: Diagnosis not present

## 2017-09-11 MED ORDER — LIDOCAINE HCL 1 % IJ SOLN
2.0000 mL | INTRAMUSCULAR | Status: AC | PRN
  Administered 2017-09-11: 2 mL

## 2017-09-11 MED ORDER — METHYLPREDNISOLONE ACETATE 40 MG/ML IJ SUSP
40.0000 mg | INTRAMUSCULAR | Status: AC | PRN
  Administered 2017-09-11: 40 mg via INTRA_ARTICULAR

## 2017-09-11 MED ORDER — BUPIVACAINE HCL 0.5 % IJ SOLN
2.0000 mL | INTRAMUSCULAR | Status: AC | PRN
  Administered 2017-09-11: 2 mL via INTRA_ARTICULAR

## 2017-09-11 NOTE — Progress Notes (Signed)
Office Visit Note   Patient: Cathy Guerrero           Date of Birth: [birth year]           MRN: [id number] Visit Date: 09/11/2017              Requested by: McLean-Scocuzza, Nino Glow, MD [place], [place] [address] PCP: McLean-Scocuzza, Nino Glow, MD   Assessment & Plan: Visit Diagnoses:  1. Acute pain of left knee     Plan: Left knee aspiration and cortisone injection performed today.  This yielded approximately 26 cc of fluid.  MRI findings are consistent with high-grade cartilage loss of the posterior medial femoral condyle and subchondral marrow edema.  I recommend activity modification.  May consider crutches or cane to offload as needed.  NSAIDs as needed.  Out of work note for tonight.  Follow-up as needed.  Follow-Up Instructions: Return if symptoms worsen or fail to improve.   Orders:  No orders of the defined types were placed in this encounter.  No orders of the defined types were placed in this encounter.     Procedures: Large Joint Inj: L knee on 09/11/2017 9:39 AM Details: 22 G needle Medications: 2 mL bupivacaine 0.5 %; 2 mL lidocaine 1 %; 40 mg methylPREDNISolone acetate 40 MG/ML Aspirate: 26 mL blood-tinged Outcome: tolerated well, no immediate complications Patient was prepped and draped in the usual sterile fashion.       Clinical Data: No additional findings.   Subjective: Chief Complaint  Patient presents with  . Left Knee - Follow-up    MRI review    Cathy Guerrero follows up today for MRI review.  She is feeling much better than 2 weeks ago.   Review of Systems  Constitutional: Negative.   HENT: Negative.   Eyes: Negative.   Respiratory: Negative.   Cardiovascular: Negative.   Endocrine: Negative.   Musculoskeletal: Negative.   Neurological: Negative.   Hematological: Negative.   Psychiatric/Behavioral: Negative.   All other systems reviewed and are negative.    Objective: Vital Signs: Ht '5\' 2"'  (1.575 m)   Wt 186 lb (84.4  kg)   BMI 34.02 kg/m   Physical Exam  Constitutional: She is oriented to person, place, and time. She appears well-developed and well-nourished.  Pulmonary/Chest: Effort normal.  Neurological: She is alert and oriented to person, place, and time.  Skin: Skin is warm. Capillary refill takes less than 2 seconds.  Psychiatric: She has a normal mood and affect. Her behavior is normal. Judgment and thought content normal.  Nursing note and vitals reviewed.   Ortho Exam Left knee exam shows a moderate joint effusion. Specialty Comments:  No specialty comments available.  Imaging: No results found.   PMFS History: Patient Active Problem List   Diagnosis Date Noted  . Acute pain of left knee 07/24/2017  . Chronic right-sided low back pain with right-sided sciatica 07/24/2017  . SVT (supraventricular tachycardia) ([place]) 06/05/2017  . Uterus, adenomyosis 04/04/2017  . Paroxysmal SVT (supraventricular tachycardia) ([place]) 03/30/2017  . Chronic abdominal pain 03/30/2017  . Anxiety 02/15/2017  . Depression, major, single episode, mild ([place]) 02/15/2017  . Heart murmur 02/15/2017  . GERD (gastroesophageal reflux disease) 02/18/2016  . Insomnia 02/18/2016  . RLS (restless legs syndrome) 06/30/2014  . Obesity 08/12/2013  . Hypertension    Past Medical History:  Diagnosis Date  . Anemia   . Anxiety   . Fibromyalgia   . Frequent headaches    Followed by  Neurology - in [place]  . GERD (gastroesophageal reflux disease)   . Hypertension 3 years ago  . Ovarian cyst   . Rheumatoid arthritis ([place]) From RMSF    Remission 2 years; saw Dr. Wadie Lessen [place] was on MTX, Humira, Prednisone; normal ESR 8, anti CCP neg, CRP neg, RF negative , ANA neg with h/o +ANA, anti DS DNA 08/2008  . [facility] spotted fever 5 years ago   +IgM serologies   . Sleep apnea    RESOLVED SINCE GASTRIC BYPASS SURGERY IN 2015  . SVT (supraventricular tachycardia) ([place]) Noticed 3 years ago   adenosine  sensitive short PR SVT docymented by EKG 4/17  . Syncope and collapse 3 years ago - Happened about 2 times - Last time    Seen by neuro and cardiology - increased heart rate related    Family History  Problem Relation Age of Onset  . Heart disease Mother   . Heart attack Mother   . Stroke Mother   . Hypertension Mother   . Hyperlipidemia Mother   . Arthritis Mother        Rheumatoid arthritis  . Kidney disease Mother   . Depression Mother   . Diabetes Mother   . Heart disease Father   . Heart attack Father   . Hypertension Father   . Hyperlipidemia Father   . Kidney disease Father   . Diabetes Father   . COPD Father   . Heart attack Maternal Grandmother   . Breast cancer Maternal Grandmother   . Arthritis Maternal Grandfather        rheumatoid arthritis  . Diabetes Maternal Grandfather   . Ovarian cancer Sister        1/2 sister  . Uterine cancer Sister        1/2 sister    Past Surgical History:  Procedure Laterality Date  . APPENDECTOMY  1984  . CARDIAC ELECTROPHYSIOLOGY MAPPING AND ABLATION N/A 06/05/2017  . CHOLECYSTECTOMY N/A 09/01/2014   Procedure: LAPAROSCOPIC CHOLECYSTECTOMY;  Surgeon: Bonner Puna, MD;  Location: [facility] ORS;  Service: General;  Laterality: N/A;  . ENDOMETRIAL ABLATION  2010? Pt unsure  . LAPAROSCOPIC GASTRIC SLEEVE RESECTION    . MOUTH SURGERY    . REDUCTION MAMMAPLASTY Bilateral 2003  . SVT ABLATION N/A 06/05/2017   Procedure: SVT ABLATION;  Surgeon: Thompson Grayer, MD;  Location: [facility];  Service: Cardiovascular;  Laterality: N/A;  . TUBAL LIGATION  2001   Social History   Occupational History  . Occupation: Respiratory Therapist    Employer: [employer]  Tobacco Use  . Smoking status: Never Smoker  . Smokeless tobacco: Never Used  Substance and Sexual Activity  . Alcohol use: No  . Drug use: No  . Sexual activity: Yes    Birth control/protection: Other-see comments    Comment: Ablation

## 2017-09-12 MED FILL — SERTRALINE HCL 50 MG TABLET: 50 | 30 days supply | Qty: 30 | Fill #5

## 2017-09-12 NOTE — Telephone Encounter (Signed)
Entered in error  TMS 

## 2017-09-18 ENCOUNTER — Ambulatory Visit (INDEPENDENT_AMBULATORY_CARE_PROVIDER_SITE_OTHER): Payer: 59 | Admitting: Orthopaedic Surgery

## 2017-09-19 MED FILL — TRETINOIN 0.05% CREAM: 0.05 | 30 days supply | Qty: 45 | Fill #1

## 2017-09-19 MED FILL — CLIND PH-BENZOYL PEROX 1.2-: 1.2-5 | 30 days supply | Qty: 45 | Fill #1

## 2017-09-19 MED FILL — ALPRAZolam 0.25 MG TABS: 0.25 | 30 days supply | Qty: 30 | Fill #1

## 2017-10-02 ENCOUNTER — Ambulatory Visit
Admission: RE | Admit: 2017-10-02 | Discharge: 2017-10-02 | Disposition: A | Discharge status: Home / Self Care | Payer: 59 | Source: Ambulatory Visit | Attending: Internal Medicine | Admitting: Internal Medicine

## 2017-10-02 DIAGNOSIS — Z1231 Encounter for screening mammogram for malignant neoplasm of breast: Secondary | ICD-10-CM | POA: Diagnosis not present

## 2017-10-09 MED FILL — ESZOPICLONE 2 MG TAB: 2 | 30 days supply | Qty: 30 | Fill #2

## 2017-10-10 ENCOUNTER — Other Ambulatory Visit: Payer: Self-pay | Admitting: Internal Medicine

## 2017-10-10 ENCOUNTER — Telehealth: Payer: Self-pay | Admitting: Internal Medicine

## 2017-10-10 ENCOUNTER — Telehealth: Payer: Self-pay | Admitting: *Deleted

## 2017-10-10 DIAGNOSIS — F320 Major depressive disorder, single episode, mild: Secondary | ICD-10-CM

## 2017-10-10 MED ORDER — SERTRALINE HCL 50 MG PO TABS: 50.0000 mg | ORAL_TABLET | Freq: Every day | ORAL | 3 refills | Status: DC

## 2017-10-10 MED FILL — SERTRALINE HCL 50 MG TABLET: 50 | 90 days supply | Qty: 90 | Fill #0

## 2017-10-10 NOTE — Telephone Encounter (Signed)
Medication filled on 10/10/17

## 2017-10-10 NOTE — Telephone Encounter (Signed)
Copied from [place] [id number]. Topic: Inquiry >> Oct 10, 2017  8:30 AM Pricilla Handler wrote: Reason for CRM: Patient called requesting a refill of Sertraline (ZOLOFT) 50 MG tablet. Patient's preferred pharmacy is [place], [place] - [address]. [phone] (Phone)  [phone] (Fax). Patient needs medication asap for delivery to [facility] by the pharmacy.       Thank You!!!

## 2017-10-10 NOTE — Telephone Encounter (Signed)
Per chart medication has been filled.

## 2017-10-10 NOTE — Telephone Encounter (Signed)
Copied from [place] [id number]. Topic: Inquiry >> Oct 10, 2017  8:30 AM Pricilla Handler wrote: Reason for CRM: Patient called requesting a refill of Sertraline (ZOLOFT) 50 MG tablet. Patient's preferred pharmacy is [place], [place] - [address]. [phone] (Phone)  [phone] (Fax). Patient needs medication asap for delivery to [facility] by the pharmacy.       Thank You!!!

## 2017-10-16 ENCOUNTER — Encounter: Payer: Self-pay | Admitting: Internal Medicine

## 2017-10-16 ENCOUNTER — Ambulatory Visit (INDEPENDENT_AMBULATORY_CARE_PROVIDER_SITE_OTHER): Payer: 59 | Admitting: Internal Medicine

## 2017-10-16 ENCOUNTER — Ambulatory Visit: Payer: 59 | Admitting: Internal Medicine

## 2017-10-16 VITALS — BP 126/80 | HR 86 | Temp 98.1°F | Ht 62.0 in | Wt 185.0 lb

## 2017-10-16 DIAGNOSIS — W57XXXD Bitten or stung by nonvenomous insect and other nonvenomous arthropods, subsequent encounter: Secondary | ICD-10-CM | POA: Diagnosis not present

## 2017-10-16 DIAGNOSIS — M25511 Pain in right shoulder: Secondary | ICD-10-CM

## 2017-10-16 DIAGNOSIS — M2550 Pain in unspecified joint: Secondary | ICD-10-CM | POA: Diagnosis not present

## 2017-10-16 DIAGNOSIS — M25562 Pain in left knee: Secondary | ICD-10-CM

## 2017-10-16 NOTE — Progress Notes (Addendum)
Chief Complaint  Patient presents with  . Follow-up   F/u  1. Left knee pain with arthritis changes improved s/p 3/4 oz joint aspiration with [facility] fluid was serosanguinous and since 08/2017 pain is improved in left knee  2. H/o rheumatologic issues and arthralgia former rheumatologist Dr. Charlestine Night retired needs new rheumatologist also h/o tick bite and not sure if joint pain related  3. SVT improved after ablation   Review of Systems  Constitutional: Negative for weight loss.  HENT: Negative for hearing loss.   Cardiovascular: Negative for palpitations.  Musculoskeletal: Positive for joint pain.  Skin: Negative for rash.   Past Medical History:  Diagnosis Date  . Anemia   . Anxiety   . Fibromyalgia   . Frequent headaches    Followed by Neurology - in [place] - stress  . GERD (gastroesophageal reflux disease)   . Hypertension 3 years ago  . Ovarian cyst   . Rheumatoid arthritis ([place]) From [facility]    Remission 2 years; saw Dr. Wadie Lessen [place] was on MTX, Humira, Prednisone; normal ESR 8, anti CCP neg, CRP neg, RF negative , ANA neg with h/o +ANA, anti DS DNA 08/2008  . [facility] spotted fever 5 years ago   +IgM serologies   . Sleep apnea    RESOLVED SINCE GASTRIC BYPASS SURGERY IN 2015  . SVT (supraventricular tachycardia) ([place]) Noticed 3 years ago   adenosine sensitive short PR SVT docymented by EKG 4/17  . Syncope and collapse 3 years ago - Happened about 2 times - Last time    Seen by neuro and cardiology - increased heart rate related   Past Surgical History:  Procedure Laterality Date  . APPENDECTOMY  1984  . CARDIAC ELECTROPHYSIOLOGY MAPPING AND ABLATION N/A 06/05/2017  . CHOLECYSTECTOMY N/A 09/01/2014   Procedure: LAPAROSCOPIC CHOLECYSTECTOMY;  Surgeon: Bonner Puna, MD;  Location: [facility] ORS;  Service: General;  Laterality: N/A;  . ENDOMETRIAL ABLATION  2010? Pt unsure  . LAPAROSCOPIC GASTRIC SLEEVE RESECTION    . MOUTH SURGERY    . REDUCTION MAMMAPLASTY  Bilateral 2003  . SVT ABLATION N/A 06/05/2017   Procedure: SVT ABLATION;  Surgeon: Thompson Grayer, MD;  Location: [facility];  Service: Cardiovascular;  Laterality: N/A;  . TUBAL LIGATION  2001   Family History  Problem Relation Age of Onset  . Heart disease Mother   . Heart attack Mother   . Stroke Mother   . Hypertension Mother   . Hyperlipidemia Mother   . Arthritis Mother        Rheumatoid arthritis  . Kidney disease Mother   . Depression Mother   . Diabetes Mother   . Heart disease Father   . Heart attack Father   . Hypertension Father   . Hyperlipidemia Father   . Kidney disease Father   . Diabetes Father   . COPD Father   . Heart attack Maternal Grandmother   . Breast cancer Maternal Grandmother   . Arthritis Maternal Grandfather        rheumatoid arthritis  . Diabetes Maternal Grandfather   . Ovarian cancer Sister        1/2 sister  . Uterine cancer Sister        1/2 sister   Social History   Socioeconomic History  . Marital status: Married    Spouse name: Donnie  . Number of children: 3  . Years of education: 54  . Highest education level: Not on file  Occupational  History  . Occupation: Microbiologist: [employer]  Social Needs  . Financial resource strain: Not on file  . Food insecurity:    Worry: Not on file    Inability: Not on file  . Transportation needs:    Medical: Not on file    Non-medical: Not on file  Tobacco Use  . Smoking status: Never Smoker  . Smokeless tobacco: Never Used  Substance and Sexual Activity  . Alcohol use: No  . Drug use: No  . Sexual activity: Yes    Birth control/protection: Other-see comments    Comment: Ablation  Lifestyle  . Physical activity:    Days per week: Not on file    Minutes per session: Not on file  . Stress: Not on file  Relationships  . Social connections:    Talks on phone: Not on file    Gets together: Not on file    Attends religious service: Not on file     Active member of club or organization: Not on file    Attends meetings of clubs or organizations: Not on file    Relationship status: Not on file  . Intimate partner violence:    Fear of current or ex partner: Not on file    Emotionally abused: Not on file    Physically abused: Not on file    Forced sexual activity: Not on file  Other Topics Concern  . Not on file  Social History Narrative   Born in raised in [place], [place]. Currently resides in [place], [place]. Lives with husband (married since age 51 y.o), Donnie, and 3 children. 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 Enjoys camping, shopping, reading, and involved in church. Likes to ride motorcycle with husband and wears helmet (as of 01/2017 sold motorcycle)   Current Meds  Medication Sig  . ALPRAZolam (XANAX) 0.25 MG tablet Take 1 tablet (0.25 mg total) by mouth daily as needed for anxiety.  . Biotin 5000 MCG CAPS Take 5,000 mcg by mouth daily.   Marland Kitchen CALCIUM CARBONATE-VITAMIN D PO Take 1 tablet by mouth daily.  . Cetirizine HCl 10 MG TBDP Take by mouth.  . Clindamycin-Benzoyl Per, Refr, gel Apply 1 application topically 4 (four) times a week.  . eszopiclone (LUNESTA) 2 MG TABS tablet Take 1 tablet (2 mg total) by mouth at bedtime as needed for sleep. Take immediately before bedtime  . gabapentin (NEURONTIN) 300 MG capsule TAKE 1 CAPSULE (300 MG TOTAL) BY MOUTH AT BEDTIME.  . Multiple Vitamin (MULTIVITAMIN) tablet Take 1 tablet by mouth daily.  . norethindrone (MICRONOR,CAMILA,ERRIN) 0.35 MG tablet Take 1 tablet by mouth daily.   . pantoprazole (PROTONIX) 40 MG tablet Take 1 tablet (40 mg total) by mouth daily. 30 min. Before food  . sertraline (ZOLOFT) 50 MG tablet Take 1 tablet (50 mg total) by mouth daily.  Marland Kitchen tretinoin (RETIN-A) 0.05 % cream Apply 1 application topically daily as needed (for face).  . vitamin B-12 (CYANOCOBALAMIN) 100 MCG tablet Take 1,000 mcg by mouth daily.   Current  Facility-Administered Medications for the 10/16/17 encounter (Office Visit) with McLean-Scocuzza, Nino Glow, MD  Medication  . 0.9 %  sodium chloride infusion   Allergies  Allergen Reactions  . Amoxicillin Hives  . Other Other (See Comments)    novacaine   . Enbrel [Etanercept] Hives and Rash   No results found for this or any previous visit (from the past 2160 hour(s)). Objective  Body  mass index is 33.84 kg/m. Wt Readings from Last 3 Encounters:  10/16/17 185 lb (83.9 kg)  09/11/17 186 lb (84.4 kg)  07/24/17 186 lb 14.4 oz (84.8 kg)   Temp Readings from Last 3 Encounters:  10/16/17 98.1 F (36.7 C) (Oral)  07/24/17 98.2 F (36.8 C) (Oral)  07/09/17 98.2 F (36.8 C)   BP Readings from Last 3 Encounters:  10/16/17 126/80  07/24/17 126/66  07/09/17 98/76   Pulse Readings from Last 3 Encounters:  10/16/17 86  07/24/17 (!) 108  07/09/17 81    Physical Exam  Constitutional: She is oriented to person, place, and time. Vital signs are normal. She appears well-developed and well-nourished. She is cooperative.  HENT:  Head: Normocephalic and atraumatic.  Mouth/Throat: Oropharynx is clear and moist and mucous membranes are normal.  Eyes: Pupils are equal, round, and reactive to light. Conjunctivae are normal.  Cardiovascular: Normal rate, regular rhythm and normal heart sounds.  Pulmonary/Chest: Effort normal and breath sounds normal.  Neurological: She is alert and oriented to person, place, and time. Gait normal.  Skin: Skin is warm, dry and intact.  Psychiatric: She has a normal mood and affect. Her speech is normal and behavior is normal. Judgment and thought content normal. Cognition and memory are normal.  Nursing note and vitals reviewed.   Assessment   1. Left knee pain  And right shoulder pain 2. Arthralgia h/o RA MRI 08/30/17 full thickness cartilage posterior non wt bearing surface of medial femoral condyle with severe subchondral marrow edema and cystsic changes  large jt effusion   Plan   1. Get copy MRI L knee signed release today  Pt to call ortho back and consider steroid inj   2. RA w/u and lyme and RMSF Refer to Dr. Tomasa Blase further w/u saw 01/07/18 checked CBC, uric acid, HLA B27, CMET and [place] ext b/l f/u in 2 months  Saw 01/07/18 CBC normal HLA B27 negative CMET normal, uric acid 6.0   S/p gastric sleeve needs B12 and iron and vitamin D in future   [place] Provider: Dr. Olivia Mackie McLean-Scocuzza-Internal Medicine

## 2017-10-16 NOTE — Patient Instructions (Signed)
F/u in 4-6 months   Shoulder Pain Many things can cause shoulder pain, including:  An injury to the area.  Overuse of the shoulder.  Arthritis.  The source of the pain can be:  Inflammation.  An injury to the shoulder joint.  An injury to a tendon, ligament, or bone.  Follow these instructions at home: Take these actions to help with your pain:  Squeeze a soft ball or a foam pad as much as possible. This helps to keep the shoulder from swelling. It also helps to strengthen the arm.  Take over-the-counter and prescription medicines only as told by your health care provider.  If directed, apply ice to the area: ? Put ice in a plastic bag. ? Place a towel between your skin and the bag. ? Leave the ice on for 20 minutes, 2-3 times per day. Stop applying ice if it does not help with the pain.  If you were given a shoulder sling or immobilizer: ? Wear it as told. ? Remove it to shower or bathe. ? Move your arm as little as possible, but keep your hand moving to prevent swelling.  Contact a health care provider if:  Your pain gets worse.  Your pain is not relieved with medicines.  New pain develops in your arm, hand, or fingers. Get help right away if:  Your arm, hand, or fingers: ? Tingle. ? Become numb. ? Become swollen. ? Become painful. ? Turn white or blue. This information is not intended to replace advice given to you by your health care provider. Make sure you discuss any questions you have with your health care provider. Document Released: 12/07/2004 Document Revised: 10/24/2015 Document Reviewed: 06/22/2014 Elsevier Interactive Patient Education  [employer].

## 2017-10-16 NOTE — Progress Notes (Signed)
Pre visit review using our clinic review tool, if applicable. No additional management support is needed unless otherwise documented below in the visit note. 

## 2017-10-17 LAB — C-REACTIVE PROTEIN: CRP: 0.2 mg/dL — ABNORMAL LOW (ref 0.5–20.0)

## 2017-10-17 LAB — SEDIMENTATION RATE: SED RATE: 9 mm/h (ref 0–20)

## 2017-10-17 LAB — ROCKY MTN SPOTTED FVR ABS PNL(IGG+IGM)
RMSF IGG: NOT DETECTED
RMSF IGM: NOT DETECTED

## 2017-10-17 MED FILL — ALPRAZolam 0.25 MG TABS: 0.25 | 30 days supply | Qty: 30 | Fill #2

## 2017-10-18 LAB — CYCLIC CITRUL PEPTIDE ANTIBODY, IGG: Cyclic Citrullin Peptide Ab: <16 UNITS

## 2017-10-18 LAB — ANA: ANA: NEGATIVE

## 2017-10-18 LAB — B. BURGDORFI ANTIBODIES

## 2017-10-18 LAB — RHEUMATOID FACTOR: Rhuematoid fact SerPl-aCnc: <14 IU/mL (ref <14)

## 2017-10-19 ENCOUNTER — Encounter: Payer: Self-pay | Admitting: *Deleted

## 2017-10-29 MED FILL — NORETHINDRONE 0.35 MG TAB: 0.35 | 84 days supply | Qty: 84 | Fill #2

## 2017-11-05 MED FILL — PANTOPRAZOLE SOD DR 40 MG T: 40 | 90 days supply | Qty: 90 | Fill #0

## 2017-11-07 ENCOUNTER — Other Ambulatory Visit: Payer: Self-pay | Admitting: Internal Medicine

## 2017-11-07 DIAGNOSIS — G4700 Insomnia, unspecified: Secondary | ICD-10-CM

## 2017-11-07 MED ORDER — ESZOPICLONE 2 MG PO TABS: 2.0000 mg | ORAL_TABLET | Freq: Every evening | ORAL | 2 refills | Status: DC | PRN

## 2017-11-07 MED FILL — ESZOPICLONE 2 MG TAB: 2 | 30 days supply | Qty: 30 | Fill #0

## 2017-11-09 ENCOUNTER — Ambulatory Visit (INDEPENDENT_AMBULATORY_CARE_PROVIDER_SITE_OTHER): Payer: 59

## 2017-11-09 ENCOUNTER — Encounter (INDEPENDENT_AMBULATORY_CARE_PROVIDER_SITE_OTHER): Payer: Self-pay | Admitting: Orthopaedic Surgery

## 2017-11-09 ENCOUNTER — Other Ambulatory Visit: Payer: Self-pay | Admitting: Internal Medicine

## 2017-11-09 ENCOUNTER — Ambulatory Visit (INDEPENDENT_AMBULATORY_CARE_PROVIDER_SITE_OTHER): Payer: 59 | Admitting: Orthopaedic Surgery

## 2017-11-09 ENCOUNTER — Encounter: Payer: Self-pay | Admitting: Internal Medicine

## 2017-11-09 DIAGNOSIS — R200 Anesthesia of skin: Secondary | ICD-10-CM

## 2017-11-09 DIAGNOSIS — M25561 Pain in right knee: Secondary | ICD-10-CM | POA: Diagnosis not present

## 2017-11-09 DIAGNOSIS — M25511 Pain in right shoulder: Secondary | ICD-10-CM

## 2017-11-09 MED ORDER — METHYLPREDNISOLONE ACETATE 40 MG/ML IJ SUSP: 40.0000 mg | Freq: Once | INTRAMUSCULAR | Status: DC

## 2017-11-09 NOTE — Progress Notes (Signed)
Subjective: She is here for ultrasound-guided glenohumeral injection per Dr. Erlinda Hong for right shoulder pain for the past 3 weeks.  She has signs of adhesive capsulitis.  Procedure: Right shoulder ultrasound-guided injection.  After sterile prep with Betadine, I imaged her shoulder with 12 MHz linear probe and she had a moderate amount of subacromial bursal swelling/thickening.  We elected to do an ultrasound-guided subdeltoid bursa injection since this seemed to be a location of her pain sonographically.  Injected 5 cc 1% lidocaine without epinephrine and 40 mg methylprednisolone using ultrasound-guided needle placement.  She had excellent pain relief during the anesthetic phase and full range of motion of her shoulder.

## 2017-11-09 NOTE — Addendum Note (Signed)
Addended by: Hortencia Pilar on: 11/09/2017 09:30 AM   Modules accepted: Orders

## 2017-11-09 NOTE — Progress Notes (Signed)
Office Visit Note   Patient: Cathy Guerrero           Date of Birth: [birth year]           MRN: [id number] Visit Date: 11/09/2017              Requested by: McLean-Scocuzza, Nino Glow, MD [place], [place] [address] PCP: McLean-Scocuzza, Nino Glow, MD   Assessment & Plan: Visit Diagnoses:  1. Right shoulder pain, unspecified chronicity   2. 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  I feel that the right knee may be exhibiting similar symptoms of the left knee.  She will consider a cortisone injection.  For now her right shoulder is really bothering the most.  We sent her to Dr. Karren Burly for an ultrasound-guided right shoulder injection.  Referral for physical therapy.  Recheck in 6 weeks for her right shoulder.  Follow-Up Instructions: Return in about 6 weeks (around 12/21/2017).   Orders:  Orders Placed This Encounter  Procedures  . XR Shoulder Right  . XR KNEE 3 VIEW RIGHT   No orders of the defined types were placed in this encounter.     Procedures: No procedures performed   Clinical Data: No additional findings.   Subjective: Chief Complaint  Patient presents with  . Right Shoulder - Follow-up  . Right Knee - Follow-up    Azara comes in today for right knee and right shoulder pain.  Her shoulder is bothering her worse.  This has been going on for about 3 weeks.  She denies any injuries.  The pain is throughout her shoulder that is worse with any movement or use.  She has a questionable history of rheumatoid arthritis.  Denies any radicular symptoms.  Denies any neck pain.  Her left shoulder is doing well.  Her right knee exhibits some discomfort that is reminiscent of the left knee pain.  Denies any mechanical symptoms or swelling.  Denies any injuries.   Review of Systems  Constitutional: Negative.   HENT: Negative.   Eyes: Negative.     Respiratory: Negative.   Cardiovascular: Negative.   Endocrine: Negative.   Musculoskeletal: Negative.   Neurological: Negative.   Hematological: Negative.   Psychiatric/Behavioral: Negative.   All other systems reviewed and are negative.    Objective: Vital Signs: There were no vitals taken for this visit.  Physical Exam  Constitutional: She is oriented to person, place, and time. She appears well-developed and well-nourished.  Pulmonary/Chest: Effort normal.  Neurological: She is alert and oriented to person, place, and time.  Skin: Skin is warm. Capillary refill takes less than 2 seconds.  Psychiatric: She has a normal mood and affect. Her behavior is normal. Judgment and thought content normal.  Nursing note and vitals reviewed.   Ortho Exam Right shoulder exam shows intact rotator cuff.  Painful range of motion both active and passive in all directions.  No significant limitation in range of motion.  Negative provocative signs Right knee exam shows no joint effusion.  Collaterals and cruciates are stable. Specialty Comments:  No specialty comments available.  Imaging: Xr Knee 3 View Right  Result Date: 11/09/2017 Mild osteoarthritis with periarticular spurring.  No degenerative changes  Xr Shoulder Right  Result Date: 11/09/2017 No acute or structural abnormalities    PMFS History: Patient Active Problem List   Diagnosis Date Noted  . Acute  pain of left knee 07/24/2017  . Chronic right-sided low back pain with right-sided sciatica 07/24/2017  . SVT (supraventricular tachycardia) ([place]) 06/05/2017  . Uterus, adenomyosis 04/04/2017  . Paroxysmal SVT (supraventricular tachycardia) ([place]) 03/30/2017  . Chronic abdominal pain 03/30/2017  . Anxiety 02/15/2017  . Depression, major, single episode, mild ([place]) 02/15/2017  . Heart murmur 02/15/2017  . GERD (gastroesophageal reflux disease) 02/18/2016  . Insomnia 02/18/2016  . RLS (restless legs syndrome) 06/30/2014   . Obesity 08/12/2013  . Hypertension    Past Medical History:  Diagnosis Date  . Anemia   . Anxiety   . Fibromyalgia   . Frequent headaches    Followed by Neurology - in [place] - stress  . GERD (gastroesophageal reflux disease)   . Hypertension 3 years ago  . Ovarian cyst   . Rheumatoid arthritis ([place]) From [facility]    Remission 2 years; saw Dr. Wadie Lessen [place] was on MTX, Humira, Prednisone; normal ESR 8, anti CCP neg, CRP neg, RF negative , ANA neg with h/o +ANA, anti DS DNA 08/2008  . [facility] spotted fever 5 years ago   +IgM serologies   . Sleep apnea    RESOLVED SINCE GASTRIC BYPASS SURGERY IN 2015  . SVT (supraventricular tachycardia) ([place]) Noticed 3 years ago   adenosine sensitive short PR SVT docymented by EKG 4/17  . Syncope and collapse 3 years ago - Happened about 2 times - Last time    Seen by neuro and cardiology - increased heart rate related    Family History  Problem Relation Age of Onset  . Heart disease Mother   . Heart attack Mother   . Stroke Mother   . Hypertension Mother   . Hyperlipidemia Mother   . Arthritis Mother        Rheumatoid arthritis  . Kidney disease Mother   . Depression Mother   . Diabetes Mother   . Heart disease Father   . Heart attack Father   . Hypertension Father   . Hyperlipidemia Father   . Kidney disease Father   . Diabetes Father   . COPD Father   . Heart attack Maternal Grandmother   . Breast cancer Maternal Grandmother   . Arthritis Maternal Grandfather        rheumatoid arthritis  . Diabetes Maternal Grandfather   . Ovarian cancer Sister        1/2 sister  . Uterine cancer Sister        1/2 sister    Past Surgical History:  Procedure Laterality Date  . APPENDECTOMY  1984  . CARDIAC ELECTROPHYSIOLOGY MAPPING AND ABLATION N/A 06/05/2017  . CHOLECYSTECTOMY N/A 09/01/2014   Procedure: LAPAROSCOPIC CHOLECYSTECTOMY;  Surgeon: Bonner Puna, MD;  Location: [facility] ORS;  Service: General;  Laterality: N/A;  .  ENDOMETRIAL ABLATION  2010? Pt unsure  . LAPAROSCOPIC GASTRIC SLEEVE RESECTION    . MOUTH SURGERY    . REDUCTION MAMMAPLASTY Bilateral 2003  . SVT ABLATION N/A 06/05/2017   Procedure: SVT ABLATION;  Surgeon: Thompson Grayer, MD;  Location: [place] [facility];  Service: Cardiovascular;  Laterality: N/A;  . TUBAL LIGATION  2001   Social History   Occupational History  . Occupation: Respiratory Therapist    Employer: [employer]  Tobacco Use  . Smoking status: Never Smoker  . Smokeless tobacco: Never Used  Substance and Sexual Activity  . Alcohol use: No  . Drug use: No  . Sexual activity: Yes    Birth control/protection:  Other-see comments    Comment: Ablation

## 2017-11-17 ENCOUNTER — Other Ambulatory Visit: Payer: Self-pay | Admitting: Internal Medicine

## 2017-11-17 DIAGNOSIS — F419 Anxiety disorder, unspecified: Secondary | ICD-10-CM

## 2017-11-17 MED ORDER — ALPRAZOLAM 0.25 MG PO TABS: 0.2500 mg | ORAL_TABLET | Freq: Every day | ORAL | 2 refills | Status: DC | PRN

## 2017-11-19 ENCOUNTER — Telehealth (HOSPITAL_COMMUNITY): Payer: Self-pay | Admitting: Internal Medicine

## 2017-11-19 MED FILL — ALPRAZolam 0.25 MG TABS: 0.25 | 30 days supply | Qty: 30 | Fill #0

## 2017-11-19 NOTE — Telephone Encounter (Signed)
left a message to cx today since Cathy Guerrero is out and reschedule to either 9/10 with Beth at 1:45 or 2:30 or this Friday there is an 8:15 available if she wants early morning  11/19/17

## 2017-11-20 ENCOUNTER — Ambulatory Visit (HOSPITAL_COMMUNITY): Payer: 59

## 2017-11-26 ENCOUNTER — Ambulatory Visit (HOSPITAL_COMMUNITY): Payer: 59 | Attending: Orthopaedic Surgery | Admitting: Specialist

## 2017-11-26 ENCOUNTER — Other Ambulatory Visit: Payer: Self-pay

## 2017-11-26 ENCOUNTER — Encounter (HOSPITAL_COMMUNITY): Payer: Self-pay | Admitting: Specialist

## 2017-11-26 DIAGNOSIS — M25611 Stiffness of right shoulder, not elsewhere classified: Secondary | ICD-10-CM | POA: Diagnosis not present

## 2017-11-26 DIAGNOSIS — M25511 Pain in right shoulder: Secondary | ICD-10-CM | POA: Diagnosis not present

## 2017-11-26 DIAGNOSIS — R29898 Other symptoms and signs involving the musculoskeletal system: Secondary | ICD-10-CM | POA: Insufficient documentation

## 2017-11-26 NOTE — Patient Instructions (Signed)
SHOULDER: Flexion On Table   Place hands on table, slide arms across table, leaning forward as able.  Repeat for 3-5 minutes.  Abduction (Passive)   With arm out to side, slide arm across table, leaning to side as able.  Complete 3-5 minutes.  Copyright  VHI. All rights reserved.     Internal Rotation (Assistive)   Seated with elbow bent at right angle and held against side, slide arm on table surface in an inward arc. Repeat 3-5 minutes.  Activity: Use this motion to brush crumbs off the table.  Copyright  VHI. All rights reserved.

## 2017-11-26 NOTE — Therapy (Signed)
[place], [place], [address] Phone: [phone]   Fax:  [phone]  Occupational Therapy Evaluation  Patient Details  Name: Cathy Guerrero MRN: [id number] Date of Birth: [birth year] Referring Provider: DR. Xu   Encounter Date: 11/26/2017  OT End of Session - 11/26/17 1609    Visit Number  1    Number of Visits  12    Date for OT Re-Evaluation  01/07/18   mini reassess on 10/7   Authorization Type  [facility], no visit limit    OT Start Time  0945    OT Stop Time  1030    OT Time Calculation (min)  45 min    Activity Tolerance  Patient tolerated treatment well    Behavior During Therapy  WFL for tasks assessed/performed       Past Medical History:  Diagnosis Date  . Anemia   . Anxiety   . Fibromyalgia   . Frequent headaches    Followed by Neurology - in [place] - stress  . GERD (gastroesophageal reflux disease)   . Hypertension 3 years ago  . Ovarian cyst   . Rheumatoid arthritis ([place]) From [facility]    Remission 2 years; saw Dr. Wadie Lessen [place] was on MTX, Humira, Prednisone; normal ESR 8, anti CCP neg, CRP neg, RF negative , ANA neg with h/o +ANA, anti DS DNA 08/2008  . [facility] spotted fever 5 years ago   +IgM serologies   . Sleep apnea    RESOLVED SINCE GASTRIC BYPASS SURGERY IN 2015  . SVT (supraventricular tachycardia) ([place]) Noticed 3 years ago   adenosine sensitive short PR SVT docymented by EKG 4/17  . Syncope and collapse 3 years ago - Happened about 2 times - Last time    Seen by neuro and cardiology - increased heart rate related    Past Surgical History:  Procedure Laterality Date  . APPENDECTOMY  1984  . CARDIAC ELECTROPHYSIOLOGY MAPPING AND ABLATION N/A 06/05/2017  . CHOLECYSTECTOMY N/A 09/01/2014   Procedure: LAPAROSCOPIC CHOLECYSTECTOMY;  Surgeon: Bonner Puna, MD;  Location: [facility] ORS;  Service: General;  Laterality: N/A;  . ENDOMETRIAL ABLATION  2010? Pt unsure  . LAPAROSCOPIC  GASTRIC SLEEVE RESECTION    . MOUTH SURGERY    . REDUCTION MAMMAPLASTY Bilateral 2003  . SVT ABLATION N/A 06/05/2017   Procedure: SVT ABLATION;  Surgeon: Thompson Grayer, MD;  Location: [place] [facility];  Service: Cardiovascular;  Laterality: N/A;  . TUBAL LIGATION  2001    There were no vitals filed for this visit.  Subjective Assessment - 11/26/17 1606    Subjective   S:  I began noticing issues with my shoulder about 2 months ago.      Pertinent History  Cathy Guerrero reports that she began experiencing pain and decreased mobilty in her right shoulder approximately 2 months ago.  She consulted with her primary care MD and was referred to Dr. Erlinda Hong.  Dr. Erlinda Hong gave her a cortisone injection, which alleviated the pain minimally.  SHe has been referred to occupational therapy for evaluation and treatment.      Special Tests  FOTO:  47% independent     Patient Stated Goals  I want to get rid of the pain    Currently in Pain?  Yes    Pain Score  5     Pain Location  Shoulder    Pain Orientation  Right    Pain Descriptors / Indicators  Aching;Sharp    Pain Type  Acute pain    Pain Onset  More than a month ago    Pain Frequency  Intermittent    Aggravating Factors   certain movements    Pain Relieving Factors  rest    Effect of Pain on Daily Activities  moderate        [facility] OT Assessment - 11/26/17 0001      Assessment   Medical Diagnosis  Right Shoulder Adhesive Capsulitis    Referring Provider  DR. Xu    Onset Date/Surgical Date  --   8 weeks ago   Hand Dominance  Right    Next MD Visit  --   unknown   Prior Therapy  no      Precautions   Precautions  None      Restrictions   Weight Bearing Restrictions  No      Balance Screen   Has the patient fallen in the past 6 months  No    Has the patient had a decrease in activity level because of a fear of falling?   No    Is the patient reluctant to leave their home because of a fear of falling?   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  Patient unable to wear her purse on her right shoulder due to pain.  difficult to compelte work tasks due to pain and limitied shoulder mobilty       Written Expression   Dominant Hand  Right      Vision - History   Baseline Vision  No visual deficits      Cognition   Overall Cognitive Status  Within Functional Limits for tasks assessed      Observation/Other Assessments   Focus on Therapeutic Outcomes (FOTO)   47% independent      Sensation   Additional Comments  right arm goes numb from shoulder to hand if she does not move it regularly      Coordination   Gross Motor Movements are Fluid and Coordinated  Yes    Fine Motor Movements are Fluid and Coordinated  Yes      ROM / Strength   AROM / PROM / Strength  AROM;PROM;Strength      Palpation   Palpation comment  moderate fascial restrictions in right shoulder region       AROM   Overall AROM Comments  assessed in seated, external and interanl rotation with shoulder adducted     AROM Assessment Site  Shoulder    Right/Left Shoulder  Right    Right Shoulder Flexion  95 Degrees    Right Shoulder ABduction  80 Degrees    Right Shoulder Internal Rotation  90 Degrees    Right Shoulder External Rotation  35 Degrees      PROM   Overall PROM Comments  assessed in supine    PROM Assessment Site  Shoulder    Right/Left Shoulder  Right    Right Shoulder Flexion  95 Degrees    Right Shoulder ABduction  90 Degrees    Right Shoulder Internal Rotation  85 Degrees    Right Shoulder External Rotation  0  Degrees      Strength   Overall Strength Comments  assessed in seated    Strength Assessment Site  Shoulder    Right/Left Shoulder  Right    Right Shoulder Flexion  4-/5    Right Shoulder ABduction  4-/5    Right Shoulder Internal Rotation  4/5    Right Shoulder External Rotation  4/5               OT Treatments/Exercises (OP) - 11/26/17 0001      Manual Therapy   Manual Therapy  Myofascial release    Manual therapy comments  manual therapy interventions completed seperately from all other interventions this date of service     Myofascial Release  Myofascial release and manual stretching to right upper arm, scapular region and associated areas to decrease pain and improve pain free mobiilty in right shoulder region             OT Education - 11/26/17 1608    Education Details  educated patient on HEP for shoulder table stretches     Person(s) Educated  Patient    Methods  Explanation    Comprehension  Verbalized understanding;Returned demonstration;Verbal cues required       OT Short Term Goals - 11/26/17 1614      OT SHORT TERM GOAL #1   Title  Patient will be educated on HEP for improved mobility in right shoulder region.     Time  3    Period  Weeks    Status  New    Target Date  12/17/17      OT SHORT TERM GOAL #2   Title  Patient will increase p/rom in her right shoulder to [facility] in order to don and doff shirts with increased ease.     Time  3    Period  Weeks    Status  New      OT SHORT TERM GOAL #3   Title  Patient will decrease pain to 3/10 or better when completing work tasks.     Time  3    Period  Weeks    Status  New      OT SHORT TERM GOAL #4   Title  Patient will decrease fascial restrictions to moderate in her right shoulder region.      Time  3    Period  Weeks    Status  New        OT Long Term Goals - 11/26/17 1616      OT LONG TERM GOAL #1   Title  Patient will return to prior level of independence with all b/iadls, work,  and leisure activities, using right arm as dominant.     Time  6    Period  Weeks    Status  New    Target Date  01/07/18      OT LONG TERM GOAL #2   Title  Patient will have wfl A/ROM in right shoulder in order to reach into overhead cabinets.     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     Time  6    Period  Weeks    Status  New      OT LONG TERM GOAL #4   Title  Patient will have minimal fascial restrictions in her right shoulder region for greater mobiilty andl ess pain.     Time  6    Period  Weeks    Status  New      OT LONG TERM GOAL #5   Title  Patient will decrease right shoulder pain to 2/10 or better when completing work tasks.     Time  6    Period  Weeks    Status  New            Plan - 11/26/17 1610    Clinical Impression Statement  A:  Patient is a 42 year old with history of joint pain from rheumatoid arthritis and [place] spotted fever.  Her joint pain had been in remission for over 4 years.  Approximately 8 weeks ago, patient began to have limited range in her right shoulder and increased pain.  These deficits are causing Cathy Guerrero to have difficulty completing her B/IADLs, work, and leisure activiites.  Patient will benefit from skilled OT intervention to decrease pain and restrictions and improve pain free mobiity.      Occupational Profile and client history currently impacting functional performance  age and motivation, past medical history    Occupational performance deficits (Please refer to evaluation for details):  ADL's;Rest and Sleep;Work;Leisure    Rehab Potential  Good    OT Frequency  2x / week    OT Duration  6 weeks    OT Treatment/Interventions  Self-care/ADL training;Electrical Stimulation;Therapeutic exercise;Iontophoresis;Patient/family education;Neuromuscular education;Moist Heat;Energy  conservation;Therapeutic activities;Passive range of motion;Manual Therapy;Ultrasound;Cryotherapy;DME and/or AE instruction    Plan  P:  Patient will recieved skilled OT intervention 2 times per week for 6 weeks to decrease pain and restrictions and improve pain free mobility.  Next session:  manual therapy, begin aa/rom and p/rom, progress as tolerated.     Clinical Decision Making  Several treatment options, min-mod task modification necessary    OT Home Exercise Plan  towel slides    Consulted and Agree with Plan of Care  Patient       Patient will benefit from skilled therapeutic intervention in order to improve the following deficits and impairments:  Pain, Increased fascial restrictions, Impaired sensation, Decreased range of motion, Decreased strength, Impaired UE functional use, Increased muscle spasms  Visit Diagnosis: Acute pain of right shoulder - Plan: Ot plan of care cert/re-cert  Stiffness of right shoulder, not elsewhere classified - Plan: Ot plan of care cert/re-cert  Other symptoms and signs involving the musculoskeletal system - Plan: Ot plan of care cert/re-cert    Problem List Patient Active Problem List   Diagnosis Date Noted  . Acute pain of left knee 07/24/2017  . Chronic right-sided low back pain with right-sided sciatica 07/24/2017  . SVT (supraventricular tachycardia) ([place]) 06/05/2017  . Uterus, adenomyosis 04/04/2017  . Paroxysmal SVT (supraventricular tachycardia) ([place]) 03/30/2017  . Chronic abdominal pain 03/30/2017  . Anxiety 02/15/2017  . Depression, major, single episode, mild ([place]) 02/15/2017  . Heart murmur 02/15/2017  . GERD (gastroesophageal reflux disease) 02/18/2016  . Insomnia 02/18/2016  . RLS (restless legs syndrome) 06/30/2014  . Obesity 08/12/2013  . Hypertension     Vangie Bicker, [place], OTR/L [phone]  11/26/2017, 4:26 PM  [facility] [address] [place], [place],  [address] Phone: [phone]   Fax:  [phone]  Name: Cathy Guerrero MRN: [id number] Date of Birth: September 19, 1975

## 2017-12-05 ENCOUNTER — Ambulatory Visit (HOSPITAL_COMMUNITY): Payer: 59

## 2017-12-05 ENCOUNTER — Encounter (HOSPITAL_COMMUNITY): Payer: Self-pay

## 2017-12-05 DIAGNOSIS — M25511 Pain in right shoulder: Secondary | ICD-10-CM

## 2017-12-05 DIAGNOSIS — R29898 Other symptoms and signs involving the musculoskeletal system: Secondary | ICD-10-CM | POA: Diagnosis not present

## 2017-12-05 DIAGNOSIS — M25611 Stiffness of right shoulder, not elsewhere classified: Secondary | ICD-10-CM | POA: Diagnosis not present

## 2017-12-05 NOTE — Therapy (Signed)
[place], [place], [address] Phone: [phone]   Fax:  [phone]  Occupational Therapy Treatment  Patient Details  Name: Cathy Guerrero MRN: [id number] Date of Birth: 21-Aug-1975 Referring Provider: DR. Xu   Encounter Date: 12/05/2017  OT End of Session - 12/05/17 0901    Visit Number  2    Number of Visits  12    Date for OT Re-Evaluation  01/07/18   mini reassess on 10/7   Authorization Type  [facility]R, no visit limit    OT Start Time  0818    OT Stop Time  0900    OT Time Calculation (min)  42 min    Activity Tolerance  Patient tolerated treatment well    Behavior During Therapy  WFL for tasks assessed/performed       Past Medical History:  Diagnosis Date  . Anemia   . Anxiety   . Fibromyalgia   . Frequent headaches    Followed by Neurology - in [place] - stress  . GERD (gastroesophageal reflux disease)   . Hypertension 3 years ago  . Ovarian cyst   . Rheumatoid arthritis ([place]) From [facility]    Remission 2 years; saw Dr. Wadie Lessen [place] was on MTX, Humira, Prednisone; normal ESR 8, anti CCP neg, CRP neg, RF negative , ANA neg with h/o +ANA, anti DS DNA 08/2008  . [facility] spotted fever 5 years ago   +IgM serologies   . Sleep apnea    RESOLVED SINCE GASTRIC BYPASS SURGERY IN 2015  . SVT (supraventricular tachycardia) ([place]) Noticed 3 years ago   adenosine sensitive short PR SVT docymented by EKG 4/17  . Syncope and collapse 3 years ago - Happened about 2 times - Last time    Seen by neuro and cardiology - increased heart rate related    Past Surgical History:  Procedure Laterality Date  . APPENDECTOMY  1984  . CARDIAC ELECTROPHYSIOLOGY MAPPING AND ABLATION N/A 06/05/2017  . CHOLECYSTECTOMY N/A 09/01/2014   Procedure: LAPAROSCOPIC CHOLECYSTECTOMY;  Surgeon: Bonner Puna, MD;  Location: [facility] ORS;  Service: General;  Laterality: N/A;  . ENDOMETRIAL ABLATION  2010? Pt unsure  . LAPAROSCOPIC  GASTRIC SLEEVE RESECTION    . MOUTH SURGERY    . REDUCTION MAMMAPLASTY Bilateral 2003  . SVT ABLATION N/A 06/05/2017   Procedure: SVT ABLATION;  Surgeon: Thompson Grayer, MD;  Location: [place] [facility];  Service: Cardiovascular;  Laterality: N/A;  . TUBAL LIGATION  2001    There were no vitals filed for this visit.  Subjective Assessment - 12/05/17 0820    Currently in Pain?  Yes    Pain Score  2     Pain Location  Shoulder    Pain Orientation  Right    Pain Descriptors / Indicators  Aching;Sharp    Pain Type  Acute pain    Pain Onset  More than a month ago    Pain Frequency  Intermittent    Aggravating Factors   certain movements    Pain Relieving Factors  rest    Effect of Pain on Daily Activities  moderate    Multiple Pain Sites  No         [facility] OT Assessment - 12/05/17 0835      Assessment   Medical Diagnosis  Right Shoulder Adhesive Capsulitis      Precautions   Precautions  None  OT Treatments/Exercises (OP) - 12/05/17 3007      Exercises   Exercises  Shoulder      Shoulder Exercises: Supine   Protraction  PROM;5 reps;AROM;10 reps    Horizontal ABduction  PROM;5 reps;AROM;10 reps    External Rotation  PROM;5 reps;AROM;10 reps    Internal Rotation  PROM;5 reps;AROM;10 reps    Flexion  PROM;5 reps;AROM;10 reps    ABduction  PROM;5 reps;AROM;10 reps      Shoulder Exercises: Standing   Horizontal ABduction  AROM;10 reps    Flexion  AROM;10 reps    Extension  Theraband;10 reps    Theraband Level (Shoulder Extension)  Level 2 (Red)    Row  Theraband;10 reps    Theraband Level (Shoulder Row)  Level 2 (Red)    Retraction  Theraband;10 reps    Theraband Level (Shoulder Retraction)  Level 2 (Red)      Shoulder Exercises: ROM/Strengthening   Proximal Shoulder Strengthening, Supine  10X no rest breaks      Manual Therapy   Manual Therapy  Myofascial release    Manual therapy comments  manual therapy interventions completed seperately  from all other interventions this date of service     Myofascial Release  Myofascial release and manual stretching to right upper arm, scapular region and associated areas to decrease pain and improve pain free mobiilty in right shoulder region              OT Education - 12/05/17 0835    Education Details  Pt was provided with OT evaluation printout. Reviewed goals. Scapular strengthening with red band    Person(s) Educated  Patient    Methods  Explanation;Handout;Tactile cues;Demonstration;Verbal cues    Comprehension  Verbalized understanding;Returned demonstration       OT Short Term Goals - 12/05/17 0836      OT SHORT TERM GOAL #1   Title  Patient will be educated on HEP for improved mobility in right shoulder region.     Time  3    Period  Weeks    Status  On-going      OT SHORT TERM GOAL #2   Title  Patient will increase p/rom in her right shoulder to [facility] in order to don and doff shirts with increased ease.     Time  3    Period  Weeks    Status  On-going      OT SHORT TERM GOAL #3   Title  Patient will decrease pain to 3/10 or better when completing work tasks.     Time  3    Period  Weeks    Status  On-going      OT SHORT TERM GOAL #4   Title  Patient will decrease fascial restrictions to moderate in her right shoulder region.      Time  3    Period  Weeks    Status  On-going        OT Long Term Goals - 12/05/17 0836      OT LONG TERM GOAL #1   Title  Patient will return to prior level of independence with all b/iadls, work, and leisure activities, using right arm as dominant.     Time  6    Period  Weeks    Status  On-going      OT LONG TERM GOAL #2   Title  Patient will have wfl A/ROM in right shoulder in order to reach into overhead cabinets.  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      Time  6    Period  Weeks    Status  On-going      OT LONG TERM GOAL #4   Title  Patient will have minimal fascial restrictions in her right shoulder region for greater mobiilty andl ess pain.     Time  6    Period  Weeks    Status  On-going      OT LONG TERM GOAL #5   Title  Patient will decrease right shoulder pain to 2/10 or better when completing work tasks.     Time  6    Period  Weeks    Status  On-going            Plan - 12/05/17 0902    Clinical Impression Statement  A: Initiated myofascial release, manual stretching, A/ROM, and scapular strengthening exercises as patient was able to achieve full ROM during passive and active exercises. Pain of 3-4/10 was present during all exercises although she was able to tolerate. HEP was updated with scapular strengthening. patient with rounded shoulders and poor scapular strength during session requiring VC for form and technique. Shoulder impingement tests performed including Neer's, Hawkin's and Empty Can Test which were all negative.     Plan  P: Follow up on HEP . Continue with A/ROM. Add therapy ball exercises and UBE bike.     Consulted and Agree with Plan of Care  Patient       Patient will benefit from skilled therapeutic intervention in order to improve the following deficits and impairments:  Pain, Increased fascial restrictions, Impaired sensation, Decreased range of motion, Decreased strength, Impaired UE functional use, Increased muscle spasms  Visit Diagnosis: Acute pain of right shoulder  Stiffness of right shoulder, not elsewhere classified  Other symptoms and signs involving the musculoskeletal system    Problem List Patient Active Problem List   Diagnosis Date Noted  . Acute pain of left knee 07/24/2017  . Chronic right-sided low back pain with right-sided sciatica 07/24/2017  . SVT (supraventricular tachycardia) ([place]) 06/05/2017  . Uterus, adenomyosis 04/04/2017  . Paroxysmal SVT (supraventricular  tachycardia) ([place]) 03/30/2017  . Chronic abdominal pain 03/30/2017  . Anxiety 02/15/2017  . Depression, major, single episode, mild ([place]) 02/15/2017  . Heart murmur 02/15/2017  . GERD (gastroesophageal reflux disease) 02/18/2016  . Insomnia 02/18/2016  . RLS (restless legs syndrome) 06/30/2014  . Obesity 08/12/2013  . Hypertension    Ailene Ravel, OTR/L,CBIS  [phone]  12/05/2017, 9:17 AM  [place] [address] [place], [place], [address] Phone: [phone]   Fax:  [phone]  Name: JENAVIVE LAMBOY MRN: [id number] Date of Birth: Nov 22, 1975

## 2017-12-05 NOTE — Patient Instructions (Signed)

## 2017-12-07 ENCOUNTER — Ambulatory Visit (HOSPITAL_COMMUNITY): Payer: 59

## 2017-12-07 DIAGNOSIS — M25511 Pain in right shoulder: Secondary | ICD-10-CM

## 2017-12-07 DIAGNOSIS — R29898 Other symptoms and signs involving the musculoskeletal system: Secondary | ICD-10-CM

## 2017-12-07 DIAGNOSIS — M25611 Stiffness of right shoulder, not elsewhere classified: Secondary | ICD-10-CM | POA: Diagnosis not present

## 2017-12-07 NOTE — Patient Instructions (Signed)
ELASTIC BAND SHOULDER EXTERNAL ROTATION - ER  While holding an elastic band at your side with your elbow bent, start with your hand near your stomach and then pull the band away. Keep your elbow at your side the entire time.  Complete 10-15 repetitions. Once a day.

## 2017-12-07 NOTE — Therapy (Signed)
[place], [place], [address] Phone: [phone]   Fax:  [phone]  Occupational Therapy Treatment  Patient Details  Name: Cathy Guerrero MRN: [id number] Date of Birth: 1975-10-21 No data recorded  Encounter Date: 12/07/2017  OT End of Session - 12/07/17 0846    Visit Number  3    Number of Visits  12    Date for OT Re-Evaluation  01/07/18   mini reassess on 10/7   Authorization Type  UMR, no visit limit    OT Start Time  0820   reassess   OT Stop Time  0849    OT Time Calculation (min)  29 min    Activity Tolerance  Patient tolerated treatment well    Behavior During Therapy  [facility] for tasks assessed/performed       Past Medical History:  Diagnosis Date  . Anemia   . Anxiety   . Fibromyalgia   . Frequent headaches    Followed by Neurology - in [place] - stress  . GERD (gastroesophageal reflux disease)   . Hypertension 3 years ago  . Ovarian cyst   . Rheumatoid arthritis ([place]) From [facility]    Remission 2 years; saw Dr. Wadie Lessen [place] was on MTX, Humira, Prednisone; normal ESR 8, anti CCP neg, CRP neg, RF negative , ANA neg with h/o +ANA, anti DS DNA 08/2008  . [facility] spotted fever 5 years ago   +IgM serologies   . Sleep apnea    RESOLVED SINCE GASTRIC BYPASS SURGERY IN 2015  . SVT (supraventricular tachycardia) ([place]) Noticed 3 years ago   adenosine sensitive short PR SVT docymented by EKG 4/17  . Syncope and collapse 3 years ago - Happened about 2 times - Last time    Seen by neuro and cardiology - increased heart rate related    Past Surgical History:  Procedure Laterality Date  . APPENDECTOMY  1984  . CARDIAC ELECTROPHYSIOLOGY MAPPING AND ABLATION N/A 06/05/2017  . CHOLECYSTECTOMY N/A 09/01/2014   Procedure: LAPAROSCOPIC CHOLECYSTECTOMY;  Surgeon: Bonner Puna, MD;  Location: [facility] ORS;  Service: General;  Laterality: N/A;  . ENDOMETRIAL ABLATION  2010? Pt unsure  . LAPAROSCOPIC GASTRIC  SLEEVE RESECTION    . MOUTH SURGERY    . REDUCTION MAMMAPLASTY Bilateral 2003  . SVT ABLATION N/A 06/05/2017   Procedure: SVT ABLATION;  Surgeon: Thompson Grayer, MD;  Location: [place] [facility];  Service: Cardiovascular;  Laterality: N/A;  . TUBAL LIGATION  2001    There were no vitals filed for this visit.  Subjective Assessment - 12/07/17 0846    Subjective   S: I'm feeling great. I think I might be able to stop coming.     Currently in Pain?  No/denies         [facility] OT Assessment - 12/07/17 5366      Assessment   Medical Diagnosis  Right Shoulder Adhesive Capsulitis      Precautions   Precautions  None      Palpation   Palpation comment  trace fascial restrictions       AROM   Overall AROM Comments  assessed in seated, external and interanl rotation with shoulder adducted     AROM Assessment Site  Shoulder    Right/Left Shoulder  Right    Right Shoulder Flexion  180 Degrees   previous: 95   Right Shoulder ABduction  180 Degrees   previous: 80   Right Shoulder  Internal Rotation  90 Degrees   previous: 90   Right Shoulder External Rotation  65 Degrees   previous: 35     PROM   Overall PROM Comments  assessed in supine    PROM Assessment Site  Shoulder    Right/Left Shoulder  Right    Right Shoulder Flexion  180 Degrees   previous: 95   Right Shoulder ABduction  180 Degrees   previous: 90   Right Shoulder Internal Rotation  90 Degrees   previous; 85   Right Shoulder External Rotation  60 Degrees   previous: 0     Strength   Overall Strength Comments  assessed in seated    Strength Assessment Site  Shoulder    Right/Left Shoulder  Right    Right Shoulder Flexion  5/5   previous: 4-/5   Right Shoulder ABduction  5/5   previous; 4-/5   Right Shoulder Internal Rotation  5/5   previous: 4/5   Right Shoulder External Rotation  4+/5   previous: 4/5              OT Treatments/Exercises (OP) - 12/07/17 0833      Exercises   Exercises  Shoulder       Shoulder Exercises: Prone   Flexion  Strengthening;12 reps    Flexion Weight (lbs)  3    Horizontal ABduction 1  Strengthening;12 reps    Horizontal ABduction 1 Weight (lbs)  3    Horizontal ABduction 2  Strengthening;12 reps    Horizontal ABduction 2 Weight (lbs)  3    Other Prone Exercises  [place] exercises: A/ROM, 10X; H1, H3, H4    Other Prone Exercises  scaption; 3#, 12X      Shoulder Exercises: Standing   External Rotation  Theraband;10 reps    Theraband Level (Shoulder External Rotation)  Level 2 (Red)    Extension  Theraband;10 reps    Theraband Level (Shoulder Extension)  Level 2 (Red)    Row  Theraband;10 reps    Theraband Level (Shoulder Row)  Level 2 (Red)    Retraction  Theraband;10 reps    Theraband Level (Shoulder Retraction)  Level 2 (Red)             OT Education - 12/07/17 0846    Education Details  external rotation with red band.     Person(s) Educated  Patient    Methods  Explanation;Demonstration;Tactile cues;Verbal cues;Handout    Comprehension  Returned demonstration;Verbalized understanding       OT Short Term Goals - 12/07/17 0841      OT SHORT TERM GOAL #1   Title  Patient will be educated on HEP for improved mobility in right shoulder region.     Time  3    Period  Weeks    Status  Achieved      OT SHORT TERM GOAL #2   Title  Patient will increase p/rom in her right shoulder to [facility] in order to don and doff shirts with increased ease.     Time  3    Period  Weeks    Status  Achieved      OT SHORT TERM GOAL #3   Title  Patient will decrease pain to 3/10 or better when completing work tasks.     Time  3    Period  Weeks    Status  Achieved      OT SHORT TERM GOAL #4   Title  Patient will decrease  fascial restrictions to moderate in her right shoulder region.      Time  3    Period  Weeks    Status  Achieved        OT Long Term Goals - 12/07/17 0841      OT LONG TERM GOAL #1   Title  Patient will return to prior level  of independence with all b/iadls, work, and leisure activities, using right arm as dominant.     Time  6    Period  Weeks    Status  On-going      OT LONG TERM GOAL #2   Title  Patient will have wfl A/ROM in right shoulder in order to reach into overhead cabinets.     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     Time  6    Period  Weeks    Status  Partially Met      OT LONG TERM GOAL #4   Title  Patient will have minimal fascial restrictions in her right shoulder region for greater mobiilty andl ess pain.     Time  6    Period  Weeks    Status  Achieved      OT LONG TERM GOAL #5   Title  Patient will decrease right shoulder pain to 2/10 or better when completing work tasks.     Time  6    Period  Weeks    Status  Achieved            Plan - 12/07/17 0902    Clinical Impression Statement  A: Patient arrives today feeling great with no pain and reports she feels she may be ready to stop therapy. Reassessment was completed with patient showing full active and passive ROM. Full strength in right shoulder except IR which tested at 4+/5. Session focused on scapular strengthening to increase posture and stability of right shoulder. HEP was updated to include IR with band. Discussed waiting through the weekend before discharging to make sure there is no increased pain or setbacks. patient will call us Monday morning if she still wishes to be discharge and OTR/L will complete a discharge summary.     Plan  P: Possible discharge from therapy. Patient will wait through the weekend and call Monday before appointment if she still feels great and has no deficits.     Consulted and Agree with Plan of Care  Patient       Patient will benefit from skilled therapeutic intervention in order to improve the following deficits and impairments:   Pain, Increased fascial restrictions, Impaired sensation, Decreased range of motion, Decreased strength, Impaired UE functional use, Increased muscle spasms  Visit Diagnosis: Acute pain of right shoulder  Stiffness of right shoulder, not elsewhere classified  Other symptoms and signs involving the musculoskeletal system    Problem List Patient Active Problem List   Diagnosis Date Noted  . Acute pain of left knee 07/24/2017  . Chronic right-sided low back pain with right-sided sciatica 07/24/2017  . SVT (supraventricular tachycardia) ([place]) 06/05/2017  . Uterus, adenomyosis 04/04/2017  . Paroxysmal SVT (supraventricular tachycardia) ([place]) 03/30/2017  . Chronic abdominal pain 03/30/2017  . Anxiety 02/15/2017  . Depression, major, single episode, mild ([place])  02/15/2017  . Heart murmur 02/15/2017  . GERD (gastroesophageal reflux disease) 02/18/2016  . Insomnia 02/18/2016  . RLS (restless legs syndrome) 06/30/2014  . Obesity 08/12/2013  . Hypertension    Ailene Ravel, OTR/L,CBIS  [phone]  12/07/2017, 9:07 AM  [place] [address] [place], [place], [address] Phone: [phone]   Fax:  [phone]  Name: EDDIS PINGLETON MRN: [id number] Date of Birth: 08-Aug-1975

## 2017-12-10 ENCOUNTER — Encounter (HOSPITAL_COMMUNITY): Payer: Self-pay

## 2017-12-10 ENCOUNTER — Ambulatory Visit (HOSPITAL_COMMUNITY): Payer: 59

## 2017-12-10 ENCOUNTER — Telehealth (HOSPITAL_COMMUNITY): Payer: Self-pay | Admitting: Internal Medicine

## 2017-12-10 NOTE — Therapy (Signed)
[place], [place], [address] Phone: [phone]   Fax:  [phone]  Patient Details  Name: Cathy Guerrero MRN: [id number] Date of Birth: 11-03-1975 Referring Provider:  No ref. provider found  Encounter Date: 12/10/2017  OCCUPATIONAL THERAPY DISCHARGE SUMMARY  Visits from Start of Care: 3  Current functional level related to goals / functional outcomes: All therapy goals have been met. Patient reports not deficits and is able to use her right arm for all daily and work related tasks.    Remaining deficits: None   Education / Equipment: Scapular strengthening with theraband Plan: Patient agrees to discharge.  Patient goals were met. Patient is being discharged due to meeting the stated rehab goals.  ?????         Ailene Ravel, OTR/L,CBIS  [phone]  12/10/2017, 10:56 AM  [place], [place], [address] Phone: [phone]   Fax:  [phone]

## 2017-12-10 NOTE — Telephone Encounter (Signed)
12/10/17  pt called and said that she didn't feel that needed to come back

## 2017-12-14 ENCOUNTER — Encounter (HOSPITAL_COMMUNITY): Payer: 59 | Admitting: Specialist

## 2017-12-17 MED FILL — ESZOPICLONE 2 MG TAB: 2 | 30 days supply | Qty: 30 | Fill #1

## 2017-12-17 MED FILL — ALPRAZolam 0.25 MG TABS: 0.25 | 30 days supply | Qty: 30 | Fill #1

## 2017-12-20 ENCOUNTER — Ambulatory Visit (INDEPENDENT_AMBULATORY_CARE_PROVIDER_SITE_OTHER): Payer: 59 | Admitting: Neurology

## 2017-12-20 ENCOUNTER — Encounter (HOSPITAL_COMMUNITY): Payer: 59

## 2017-12-20 DIAGNOSIS — Z0289 Encounter for other administrative examinations: Secondary | ICD-10-CM

## 2017-12-20 DIAGNOSIS — G5603 Carpal tunnel syndrome, bilateral upper limbs: Secondary | ICD-10-CM

## 2017-12-20 NOTE — Progress Notes (Signed)
Full Name: Cathy Guerrero "Cathy Guerrero Gender: Female MRN #: [id number] Date of Birth: 06-14-1975    Visit Date: 12/20/17 09:56 Age: 42 Years 78 Months Old Examining Physician: Sarina Ill, MD  Referring Physician: McLean-Scocuzza, Nino Glow, MD  History: Evaluation for hand pain  Summary: EMG/NCS performed on the bilateral upper extremities  The right median APB motor nerve showed prolonged distal onset latency (5.1 ms, N<4.4)  The left  median APB motor nerve showed prolonged distal onset latency (5.2 ms, N<4.4) and reduced amplitude(1.6 mV, N<4) The right Median 2nd Digit orthodromic sensory nerve showed prolonged distal peak latency (4.2 ms, N<3.4) and reduced amplitude(7 uV, N>10) The left  Median 2nd Digit orthodromic sensory nerve showed prolonged distal peak latency (4.3 ms, N<3.4) and reduced amplitude(4uV, N>10)  The right median/ulnar (palm) comparison nerve showed prolonged distal peak latency (Median Palm, 3.4 ms, N<2.2) and abnormal peak latency difference (Median Palm-Ulnar Palm, 1.4 ms, N<0.4) with a relative median delay.    The left median/ulnar (palm) comparison nerve showed prolonged distal peak latency (Median Palm, 3.6 ms, N<2.2) and abnormal peak latency difference (Median Palm-Ulnar Palm, 1.53ms, N<0.4) with a relative median delay.    All remaining nerves within normal limits.  All muscles within normal limits.   Conclusion: This is an abnormal study. 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  No suggestion of polyneuropathy or radiculopathy.   Sarina Ill, M.D.  Cc: McLean-Scocuzza, Nino Glow, MD  [facility] [place], [place] [address] Tel: [phone] Fax: [phone]        [facility]    Nerve / Sites Muscle Latency Ref. Amplitude Ref. Rel Amp Segments Distance Velocity Ref. Area    ms ms mV mV %  cm m/s m/s mVms  R Median - APB     Wrist APB 5.1 ?4.4 6.5  ?4.0 100 Wrist - APB 7   24.6     Upper arm APB 8.6  6.6  101 Upper arm - Wrist 21 59 ?49 25.6  L Median - APB     Wrist APB 5.2 ?4.4 1.6 ?4.0 100 Wrist - APB 7   7.3     Upper arm APB 8.9  1.6  97.9 Upper arm - Wrist 21 56 ?49 5.4  R Ulnar - ADM     Wrist ADM 2.5 ?3.3 11.3 ?6.0 100 Wrist - ADM 7   30.5     B.Elbow ADM 5.8  10.2  89.6 B.Elbow - Wrist 20 61 ?49 30.2     A.Elbow ADM 7.4  10.1  99.8 A.Elbow - B.Elbow 10 62 ?49 29.8         A.Elbow - Wrist      L Ulnar - ADM     Wrist ADM 2.3 ?3.3 11.1 ?6.0 100 Wrist - ADM 7   26.0     B.Elbow ADM 5.8  9.8  88 B.Elbow - Wrist 19 54 ?49 25.5     A.Elbow ADM 7.7  9.0  91.8 A.Elbow - B.Elbow 10 53 ?49 26.4         A.Elbow - Wrist                 SNC    Nerve / Sites Rec. Site Peak Lat Ref.  Amp Ref. Segments Distance Peak Diff Ref.    ms ms V V  cm ms ms  R Median, Ulnar - Transcarpal comparison  Median Palm Wrist 3.4 ?2.2 9 ?35 Median Palm - Wrist 8       Ulnar Palm Wrist 2.0 ?2.2 16 ?12 Ulnar Palm - Wrist 8          Median Palm - Ulnar Palm  1.4 ?0.4  L Median, Ulnar - Transcarpal comparison     Median Palm Wrist 3.6 ?2.2 9 ?35 Median Palm - Wrist 8       Ulnar Palm Wrist 2.0 ?2.2 13 ?12 Ulnar Palm - Wrist 8          Median Palm - Ulnar Palm  1.7 ?0.4  R Median - Orthodromic (Dig II, Mid palm)     Dig II Wrist 4.2 ?3.4 7 ?10 Dig II - Wrist 13    L Median - Orthodromic (Dig II, Mid palm)     Dig II Wrist 4.3 ?3.4 4 ?10 Dig II - Wrist 13    R Ulnar - Orthodromic, (Dig V, Mid palm)     Dig V Wrist 2.4 ?3.1 12 ?5 Dig V - Wrist 11    L Ulnar - Orthodromic, (Dig V, Mid palm)     Dig V Wrist 2.4 ?3.1 6 ?5 Dig V - Wrist 30                   F  Wave    Nerve F Lat Ref.   ms ms  R Ulnar - ADM 25.2 ?32.0  L Ulnar - ADM 26.5 ?32.0         EMG full       EMG Summary Table    Spontaneous MUAP Recruitment  Muscle IA Fib PSW Fasc Other Amp Dur. Poly Pattern  L. Deltoid Normal None None None _______ Normal Normal Normal Normal  R.  Deltoid Normal None None None _______ Normal Normal Normal Normal  L. Triceps brachii Normal None None None _______ Normal Normal Normal Normal  R. Triceps brachii Normal None None None _______ Normal Normal Normal Normal  L. Pronator teres Normal None None None _______ Normal Normal Normal Normal  R. Pronator teres Normal None None None _______ Normal Normal Normal Normal  L. Opponens pollicis Normal None None None _______ Normal Normal Normal Normal  R. Opponens pollicis Normal None None None _______ Normal Normal Normal Normal  L. First dorsal interosseous Normal None None None _______ Normal Normal Normal Normal  R. First dorsal interosseous Normal None None None _______ Normal Normal Normal Normal  L. Cervical paraspinals (low) Normal None None None _______ Normal Normal Normal Normal  R. Cervical paraspinals (low) Normal None None None _______ Normal Normal Normal Normal

## 2017-12-20 NOTE — Progress Notes (Signed)
See procedure note.

## 2017-12-21 ENCOUNTER — Encounter (HOSPITAL_COMMUNITY): Payer: 59

## 2017-12-24 NOTE — Procedures (Signed)
Full Name: Cathy Guerrero "Cathy Guerrero Gender: Female MRN #: [id number] Date of Birth: 01/28/1976    Visit Date: 12/20/17 09:56 Age: 42 Years 39 Months Old Examining Physician: Sarina Ill, MD  Referring Physician: McLean-Scocuzza, Nino Glow, MD  History: Evaluation for hand pain  Summary: EMG/NCS performed on the bilateral upper extremities  The right median APB motor nerve showed prolonged distal onset latency (5.1 ms, N<4.4)  The left  median APB motor nerve showed prolonged distal onset latency (5.2 ms, N<4.4) and reduced amplitude(1.6 mV, N<4) The right Median 2nd Digit orthodromic sensory nerve showed prolonged distal peak latency (4.2 ms, N<3.4) and reduced amplitude(7 uV, N>10) The left  Median 2nd Digit orthodromic sensory nerve showed prolonged distal peak latency (4.3 ms, N<3.4) and reduced amplitude(4uV, N>10)  The right median/ulnar (palm) comparison nerve showed prolonged distal peak latency (Median Palm, 3.4 ms, N<2.2) and abnormal peak latency difference (Median Palm-Ulnar Palm, 1.4 ms, N<0.4) with a relative median delay.    The left median/ulnar (palm) comparison nerve showed prolonged distal peak latency (Median Palm, 3.6 ms, N<2.2) and abnormal peak latency difference (Median Palm-Ulnar Palm, 1.38ms, N<0.4) with a relative median delay.    All remaining nerves within normal limits.  All muscles within normal limits.   Conclusion: This is an abnormal study. 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  No suggestion of polyneuropathy or radiculopathy.   Sarina Ill, M.D.  Cc: McLean-Scocuzza, Nino Glow, MD  [facility] [place], [place] [address] Tel: [phone] Fax: [phone]        [facility]    Nerve / Sites Muscle Latency Ref. Amplitude Ref. Rel Amp Segments Distance Velocity Ref. Area    ms ms mV mV %  cm m/s m/s mVms  R Median - APB     Wrist APB 5.1 ?4.4 6.5  ?4.0 100 Wrist - APB 7   24.6     Upper arm APB 8.6  6.6  101 Upper arm - Wrist 21 59 ?49 25.6  L Median - APB     Wrist APB 5.2 ?4.4 1.6 ?4.0 100 Wrist - APB 7   7.3     Upper arm APB 8.9  1.6  97.9 Upper arm - Wrist 21 56 ?49 5.4  R Ulnar - ADM     Wrist ADM 2.5 ?3.3 11.3 ?6.0 100 Wrist - ADM 7   30.5     B.Elbow ADM 5.8  10.2  89.6 B.Elbow - Wrist 20 61 ?49 30.2     A.Elbow ADM 7.4  10.1  99.8 A.Elbow - B.Elbow 10 62 ?49 29.8         A.Elbow - Wrist      L Ulnar - ADM     Wrist ADM 2.3 ?3.3 11.1 ?6.0 100 Wrist - ADM 7   26.0     B.Elbow ADM 5.8  9.8  88 B.Elbow - Wrist 19 54 ?49 25.5     A.Elbow ADM 7.7  9.0  91.8 A.Elbow - B.Elbow 10 53 ?49 26.4         A.Elbow - Wrist                 SNC    Nerve / Sites Rec. Site Peak Lat Ref.  Amp Ref. Segments Distance Peak Diff Ref.    ms ms V V  cm ms ms  R Median, Ulnar - Transcarpal comparison  Median Palm Wrist 3.4 ?2.2 9 ?35 Median Palm - Wrist 8       Ulnar Palm Wrist 2.0 ?2.2 16 ?12 Ulnar Palm - Wrist 8          Median Palm - Ulnar Palm  1.4 ?0.4  L Median, Ulnar - Transcarpal comparison     Median Palm Wrist 3.6 ?2.2 9 ?35 Median Palm - Wrist 8       Ulnar Palm Wrist 2.0 ?2.2 13 ?12 Ulnar Palm - Wrist 8          Median Palm - Ulnar Palm  1.7 ?0.4  R Median - Orthodromic (Dig II, Mid palm)     Dig II Wrist 4.2 ?3.4 7 ?10 Dig II - Wrist 13    L Median - Orthodromic (Dig II, Mid palm)     Dig II Wrist 4.3 ?3.4 4 ?10 Dig II - Wrist 13    R Ulnar - Orthodromic, (Dig V, Mid palm)     Dig V Wrist 2.4 ?3.1 12 ?5 Dig V - Wrist 11    L Ulnar - Orthodromic, (Dig V, Mid palm)     Dig V Wrist 2.4 ?3.1 6 ?5 Dig V - Wrist 76                   F  Wave    Nerve F Lat Ref.   ms ms  R Ulnar - ADM 25.2 ?32.0  L Ulnar - ADM 26.5 ?32.0         EMG full       EMG Summary Table    Spontaneous MUAP Recruitment  Muscle IA Fib PSW Fasc Other Amp Dur. Poly Pattern  L. Deltoid Normal None None None _______ Normal Normal Normal Normal  R.  Deltoid Normal None None None _______ Normal Normal Normal Normal  L. Triceps brachii Normal None None None _______ Normal Normal Normal Normal  R. Triceps brachii Normal None None None _______ Normal Normal Normal Normal  L. Pronator teres Normal None None None _______ Normal Normal Normal Normal  R. Pronator teres Normal None None None _______ Normal Normal Normal Normal  L. Opponens pollicis Normal None None None _______ Normal Normal Normal Normal  R. Opponens pollicis Normal None None None _______ Normal Normal Normal Normal  L. First dorsal interosseous Normal None None None _______ Normal Normal Normal Normal  R. First dorsal interosseous Normal None None None _______ Normal Normal Normal Normal  L. Cervical paraspinals (low) Normal None None None _______ Normal Normal Normal Normal  R. Cervical paraspinals (low) Normal None None None _______ Normal Normal Normal Normal

## 2017-12-26 ENCOUNTER — Encounter (HOSPITAL_COMMUNITY): Payer: 59

## 2017-12-26 MED FILL — TRETINOIN 0.05% CREAM: 0.05 | 30 days supply | Qty: 45 | Fill #2

## 2017-12-26 MED FILL — CLIND PH-BENZOYL PEROX 1.2-: 1.2-5 | 90 days supply | Qty: 45 | Fill #2

## 2017-12-28 ENCOUNTER — Encounter (HOSPITAL_COMMUNITY): Payer: 59

## 2017-12-31 ENCOUNTER — Encounter (HOSPITAL_COMMUNITY): Payer: 59

## 2017-12-31 MED FILL — SERTRALINE HCL 50 MG TABLET: 50 | 90 days supply | Qty: 90 | Fill #1

## 2018-01-02 ENCOUNTER — Encounter (HOSPITAL_COMMUNITY): Payer: 59

## 2018-01-07 DIAGNOSIS — M2550 Pain in unspecified joint: Secondary | ICD-10-CM | POA: Diagnosis not present

## 2018-01-07 DIAGNOSIS — Z6832 Body mass index (BMI) 32.0-32.9, adult: Secondary | ICD-10-CM | POA: Diagnosis not present

## 2018-01-07 DIAGNOSIS — M7989 Other specified soft tissue disorders: Secondary | ICD-10-CM | POA: Diagnosis not present

## 2018-01-07 DIAGNOSIS — E669 Obesity, unspecified: Secondary | ICD-10-CM | POA: Diagnosis not present

## 2018-01-08 ENCOUNTER — Encounter (HOSPITAL_COMMUNITY): Payer: 59

## 2018-01-11 ENCOUNTER — Encounter (HOSPITAL_COMMUNITY): Payer: 59 | Admitting: Occupational Therapy

## 2018-01-15 ENCOUNTER — Encounter: Payer: Self-pay | Admitting: Internal Medicine

## 2018-01-16 ENCOUNTER — Other Ambulatory Visit: Payer: Self-pay | Admitting: Internal Medicine

## 2018-01-16 DIAGNOSIS — G5602 Carpal tunnel syndrome, left upper limb: Secondary | ICD-10-CM

## 2018-01-16 DIAGNOSIS — G5603 Carpal tunnel syndrome, bilateral upper limbs: Secondary | ICD-10-CM | POA: Insufficient documentation

## 2018-01-17 MED FILL — ALPRAZolam 0.25 MG TABS: 0.25 | 30 days supply | Qty: 30 | Fill #2

## 2018-01-17 MED FILL — NORETHINDRONE 0.35 MG TAB: 0.35 | 84 days supply | Qty: 84 | Fill #3

## 2018-01-17 MED FILL — ESZOPICLONE 2 MG TAB: 2 | 30 days supply | Qty: 30 | Fill #2

## 2018-02-04 MED FILL — PANTOPRAZOLE SOD DR 40 MG T: 40 | 90 days supply | Qty: 90 | Fill #1

## 2018-03-04 ENCOUNTER — Other Ambulatory Visit: Payer: Self-pay | Admitting: Internal Medicine

## 2018-03-04 ENCOUNTER — Encounter: Payer: Self-pay | Admitting: Internal Medicine

## 2018-03-04 DIAGNOSIS — F419 Anxiety disorder, unspecified: Secondary | ICD-10-CM

## 2018-03-04 MED ORDER — ALPRAZOLAM 0.25 MG PO TABS: 0.2500 mg | ORAL_TABLET | Freq: Every day | ORAL | 2 refills | Status: DC | PRN

## 2018-03-04 MED FILL — ALPRAZolam 0.25 MG TABS: 0.25 | 30 days supply | Qty: 30 | Fill #0

## 2018-03-18 ENCOUNTER — Encounter: Payer: Self-pay | Admitting: Internal Medicine

## 2018-03-19 ENCOUNTER — Ambulatory Visit: Payer: 59 | Admitting: Internal Medicine

## 2018-03-19 ENCOUNTER — Telehealth: Payer: Self-pay

## 2018-03-19 NOTE — Telephone Encounter (Signed)
Copied from [place] [id number]. Topic: Referral - Question >> Mar 19, 2018 10:55 AM Reyne Dumas L wrote: Reason for CRM:   Pt has new insurance and this year her insurance requires referral for her to see offices outside of PCP.  Pt states that the appointments were made by referral last year, but need to be redone under new insurance. New insurance is [employer].  Member ID#: [id number] Scheduled appointments already are: 1.  03/20/2018 7:45am with Dr. Roseanne Kaufman with [facility] [phone], phone) 2.  03/27/2018 10:15am with Dr. Sedonia Small with [facility] [phone], phone)  Pt can be reached at [phone]

## 2018-03-19 NOTE — Telephone Encounter (Signed)
Please see prior referrals and refer  1. Dr. Tomasa Blase  2. Dr. Amedeo Plenty   Thanks [employer]

## 2018-04-03 MED FILL — SERTRALINE HCL 50 MG TABLET: 50 | 90 days supply | Qty: 90 | Fill #2

## 2018-04-03 MED FILL — NORLYDA 0.35 MG TABS: 0.35 | 84 days supply | Qty: 84 | Fill #4

## 2018-04-03 MED FILL — ALPRAZolam 0.25 MG TABS: 0.25 | 30 days supply | Qty: 30 | Fill #1

## 2018-04-10 ENCOUNTER — Encounter: Payer: Self-pay | Admitting: Internal Medicine

## 2018-04-10 ENCOUNTER — Ambulatory Visit (INDEPENDENT_AMBULATORY_CARE_PROVIDER_SITE_OTHER): Payer: No Typology Code available for payment source | Admitting: Internal Medicine

## 2018-04-10 VITALS — BP 122/80 | HR 102 | Temp 98.2°F | Ht 62.0 in | Wt 187.0 lb

## 2018-04-10 DIAGNOSIS — E611 Iron deficiency: Secondary | ICD-10-CM

## 2018-04-10 DIAGNOSIS — G5603 Carpal tunnel syndrome, bilateral upper limbs: Secondary | ICD-10-CM | POA: Diagnosis not present

## 2018-04-10 DIAGNOSIS — Z1159 Encounter for screening for other viral diseases: Secondary | ICD-10-CM

## 2018-04-10 DIAGNOSIS — E538 Deficiency of other specified B group vitamins: Secondary | ICD-10-CM

## 2018-04-10 DIAGNOSIS — Z1329 Encounter for screening for other suspected endocrine disorder: Secondary | ICD-10-CM

## 2018-04-10 DIAGNOSIS — J029 Acute pharyngitis, unspecified: Secondary | ICD-10-CM

## 2018-04-10 DIAGNOSIS — N809 Endometriosis, unspecified: Secondary | ICD-10-CM

## 2018-04-10 DIAGNOSIS — G2581 Restless legs syndrome: Secondary | ICD-10-CM | POA: Diagnosis not present

## 2018-04-10 DIAGNOSIS — F419 Anxiety disorder, unspecified: Secondary | ICD-10-CM

## 2018-04-10 DIAGNOSIS — Z0000 Encounter for general adult medical examination without abnormal findings: Secondary | ICD-10-CM

## 2018-04-10 DIAGNOSIS — Z1389 Encounter for screening for other disorder: Secondary | ICD-10-CM

## 2018-04-10 DIAGNOSIS — E559 Vitamin D deficiency, unspecified: Secondary | ICD-10-CM

## 2018-04-10 DIAGNOSIS — J101 Influenza due to other identified influenza virus with other respiratory manifestations: Secondary | ICD-10-CM

## 2018-04-10 DIAGNOSIS — R739 Hyperglycemia, unspecified: Secondary | ICD-10-CM

## 2018-04-10 DIAGNOSIS — Z13220 Encounter for screening for lipoid disorders: Secondary | ICD-10-CM

## 2018-04-10 LAB — POCT RAPID STREP A (OFFICE): Rapid Strep A Screen: NEGATIVE

## 2018-04-10 LAB — POC INFLUENZA A&B (BINAX/QUICKVUE)
Influenza A, POC: POSITIVE — AB
Influenza B, POC: NEGATIVE

## 2018-04-10 MED ORDER — OSELTAMIVIR PHOSPHATE 75 MG PO CAPS: 75.0000 mg | ORAL_CAPSULE | Freq: Two times a day (BID) | ORAL | 0 refills | Status: DC

## 2018-04-10 MED ORDER — BENZONATATE 200 MG PO CAPS: 200.0000 mg | ORAL_CAPSULE | Freq: Three times a day (TID) | ORAL | 0 refills | Status: DC | PRN

## 2018-04-10 NOTE — Progress Notes (Signed)
Pre visit review using our clinic review tool, if applicable. No additional management support is needed unless otherwise documented below in the visit note. 

## 2018-04-10 NOTE — Progress Notes (Addendum)
Chief Complaint  Patient presents with  . Follow-up   F/u  1. C/o PND, dry cough, h/a feeling crappy, sore throat, sinus drainage tried flonase otc w/o help sick contacts as [facility] strep negative and flu + today  2. RLS improved no longer taking gabapentin only oral magnesium and helping with leg grams  3. CTS b/l steroid shots with Dr. Amedeo Plenty  4. Anxiety better since working days  5. F/u rheumatology prn Dr. Tomasa Blase   Review of Systems  Constitutional: Negative for weight loss.  HENT: Positive for sinus pain. Negative for hearing loss.        +PND   Eyes: Negative for blurred vision.  Respiratory: Negative for shortness of breath.   Cardiovascular: Negative for chest pain.  Gastrointestinal: Negative for abdominal pain.  Musculoskeletal: Negative for falls.  Skin: Negative for rash.  Neurological: Negative for headaches.  Psychiatric/Behavioral: Negative for depression.   Past Medical History:  Diagnosis Date  . Anemia   . Anxiety   . Fibromyalgia   . Frequent headaches    Followed by Neurology - in [place] - stress  . GERD (gastroesophageal reflux disease)   . Hypertension 3 years ago  . Ovarian cyst   . Rheumatoid arthritis ([place]) From [facility]    Remission 2 years; saw Dr. Wadie Lessen [place] was on MTX, Humira, Prednisone; normal ESR 8, anti CCP neg, CRP neg, RF negative , ANA neg with h/o +ANA, anti DS DNA 08/2008  . [facility] spotted fever 5 years ago   +IgM serologies   . Sleep apnea    RESOLVED SINCE GASTRIC BYPASS SURGERY IN 2015  . SVT (supraventricular tachycardia) ([place]) Noticed 3 years ago   adenosine sensitive short PR SVT docymented by EKG 4/17  . Syncope and collapse 3 years ago - Happened about 2 times - Last time    Seen by neuro and cardiology - increased heart rate related   Past Surgical History:  Procedure Laterality Date  . APPENDECTOMY  1984  . CARDIAC ELECTROPHYSIOLOGY MAPPING AND ABLATION N/A 06/05/2017  .  CHOLECYSTECTOMY N/A 09/01/2014   Procedure: LAPAROSCOPIC CHOLECYSTECTOMY;  Surgeon: Bonner Puna, MD;  Location: [facility] ORS;  Service: General;  Laterality: N/A;  . ENDOMETRIAL ABLATION  2010? Pt unsure  . LAPAROSCOPIC GASTRIC SLEEVE RESECTION     longitudinal gastrectomy/lap restrictive 01/27/14 Dr. Kreg Shropshire  . MOUTH SURGERY    . REDUCTION MAMMAPLASTY Bilateral 2003  . SVT ABLATION N/A 06/05/2017   Procedure: SVT ABLATION;  Surgeon: Thompson Grayer, MD;  Location: [facility];  Service: Cardiovascular;  Laterality: N/A;  . TUBAL LIGATION  2001   Family History  Problem Relation Age of Onset  . Heart disease Mother   . Heart attack Mother   . Stroke Mother   . Hypertension Mother   . Hyperlipidemia Mother   . Arthritis Mother        Rheumatoid arthritis  . Kidney disease Mother   . Depression Mother   . Diabetes Mother   . Heart disease Father   . Heart attack Father   . Hypertension Father   . Hyperlipidemia Father   . Kidney disease Father   . Diabetes Father   . COPD Father   . Heart attack Maternal Grandmother   . Breast cancer Maternal Grandmother   . Arthritis Maternal Grandfather        rheumatoid arthritis  . Diabetes Maternal Grandfather   . Ovarian cancer Sister  1/2 sister  . Uterine cancer Sister        1/2 sister   Social History   Socioeconomic History  . Marital status: Married    Spouse name: Donnie  . Number of children: 3  . Years of education: 16  . Highest education level: Not on file  Occupational History  . Occupation: Respiratory Therapist    Employer: [employer]  Social Needs  . Financial resource strain: Not on file  . Food insecurity:    Worry: Not on file    Inability: Not on file  . Transportation needs:    Medical: Not on file    Non-medical: Not on file  Tobacco Use  . Smoking status: Never Smoker  . Smokeless tobacco: Never Used  Substance and Sexual Activity  . Alcohol use: No  . Drug use: No  . Sexual  activity: Yes    Birth control/protection: Other-see comments    Comment: Ablation  Lifestyle  . Physical activity:    Days per week: Not on file    Minutes per session: Not on file  . Stress: Not on file  Relationships  . Social connections:    Talks on phone: Not on file    Gets together: Not on file    Attends religious service: Not on file    Active member of club or organization: Not on file    Attends meetings of clubs or organizations: Not on file    Relationship status: Not on file  . Intimate partner violence:    Fear of current or ex partner: Not on file    Emotionally abused: Not on file    Physically abused: Not on file    Forced sexual activity: Not on file  Other Topics Concern  . Not on file  Social History Narrative   Born in raised in [place], [place]. Currently resides in [place]. Lives with husband (married since age 18 y.o), Donnie, and 3 children. 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 Enjoys camping, shopping, reading, and involved in church. Likes to ride motorcycle with husband and wears helmet (as of 01/2017 sold motorcycle)   Current Meds  Medication Sig  . ALPRAZolam (XANAX) 0.25 MG tablet Take 1 tablet (0.25 mg total) by mouth daily as needed for anxiety.  . Biotin 5000 MCG CAPS Take 5,000 mcg by mouth daily.   . CALCIUM CARBONATE-VITAMIN D PO Take 1 tablet by mouth daily.  . Cetirizine HCl 10 MG TBDP Take by mouth.  . Clindamycin-Benzoyl Per, Refr, gel Apply 1 application topically 4 (four) times a week.  . eszopiclone (LUNESTA) 2 MG TABS tablet Take 1 tablet (2 mg total) by mouth at bedtime as needed for sleep. Take immediately before bedtime  . Magnesium 250 MG TABS Take 250 mg by mouth daily.   . Multiple Vitamin (MULTIVITAMIN) tablet Take 1 tablet by mouth daily.  . norethindrone (MICRONOR,CAMILA,ERRIN) 0.35 MG tablet Take 1 tablet by mouth daily.   . pantoprazole (PROTONIX) 40 MG tablet Take 1 tablet (40 mg  total) by mouth daily. 30 min. Before food  . sertraline (ZOLOFT) 50 MG tablet Take 1 tablet (50 mg total) by mouth daily.  . tretinoin (RETIN-A) 0.05 % cream Apply 1 application topically daily as needed (for face).  . vitamin B-12 (CYANOCOBALAMIN) 100 MCG tablet Take 1,000 mcg by mouth daily.  . [DISCONTINUED] gabapentin (NEURONTIN) 300 MG capsule TAKE 1 CAPSULE (300 MG TOTAL) BY MOUTH AT BEDTIME.     Current Facility-Administered Medications for the 04/10/18 encounter (Office Visit) with McLean-Scocuzza, Nino Glow, MD  Medication  . 0.9 %  sodium chloride infusion  . methylPREDNISolone acetate (DEPO-MEDROL) injection 40 mg   Allergies  Allergen Reactions  . Amoxicillin Hives  . Other Other (See Comments)    novacaine   . Enbrel [Etanercept] Hives and Rash   No results found for this or any previous visit (from the past 2160 hour(s)). Objective  Body mass index is 34.2 kg/m. Wt Readings from Last 3 Encounters:  04/10/18 187 lb (84.8 kg)  10/16/17 185 lb (83.9 kg)  09/11/17 186 lb (84.4 kg)   Temp Readings from Last 3 Encounters:  04/10/18 98.2 F (36.8 C) (Oral)  10/16/17 98.1 F (36.7 C) (Oral)  07/24/17 98.2 F (36.8 C) (Oral)   BP Readings from Last 3 Encounters:  04/10/18 122/80  10/16/17 126/80  07/24/17 126/66   Pulse Readings from Last 3 Encounters:  04/10/18 (!) 102  10/16/17 86  07/24/17 (!) 108    Physical Exam Vitals signs and nursing note reviewed.  Constitutional:      Appearance: Normal appearance. She is well-developed and well-groomed. She is obese.  HENT:     Head: Normocephalic and atraumatic.     Nose: Nose normal.     Mouth/Throat:     Mouth: Mucous membranes are moist.     Pharynx: Oropharynx is clear.  Eyes:     Conjunctiva/sclera: Conjunctivae normal.     Pupils: Pupils are equal, round, and reactive to light.  Cardiovascular:     Rate and Rhythm: Normal rate and regular rhythm.     Pulses: Normal pulses.     Heart sounds: Normal heart  sounds.  Pulmonary:     Effort: Pulmonary effort is normal.     Breath sounds: Normal breath sounds.  Skin:    General: Skin is warm and dry.  Neurological:     General: No focal deficit present.     Mental Status: She is alert and oriented to person, place, and time. Mental status is at baseline.     Gait: Gait normal.  Psychiatric:        Attention and Perception: Attention and perception normal.        Mood and Affect: Mood and affect normal.        Speech: Speech normal.        Behavior: Behavior normal. Behavior is cooperative.        Thought Content: Thought content normal.        Cognition and Memory: Cognition and memory normal.        Judgment: Judgment normal.     Assessment   1. Flu + 2. RLS improved, leg cramps improved  3. CTS b/l wrists f/u Dr. Amedeo Plenty  4. Anxiety  5. HM Plan   1. Tamiflu, tessalon perles  2. Magnesium helping  3. F/u Dr. Amedeo Plenty  4. Doing well cont meds  5.  Had flu shot 11/2017 Tdap utd  Declines HPV vx  Hep B immune  Pap had 02/23/16 neg no h/o abnormal pap except 1991 possibly related to OCP use but resolved per pt due for repeat in 02/23/2019  -[facility] 10/01/18 for endometriosis Dr. Laddie Aquas refilled norethindrone 0.35 qd for endometrosis   sch fasting labs 05/26/2018 [facility]   Mammo due 10/02/17 negative   Obtained notes from [facility]ology and arthritis center (Dr. Scarlette Shorts) now f/u rheumatology [place]/Dr. Sherilyn Dacosta dermatology near [facility]  in [place] in 2019   Provider: Dr. Olivia Mackie McLean-Scocuzza-Internal Medicine

## 2018-04-10 NOTE — Patient Instructions (Signed)
You could also try mucinex dm otc for cough  Fasting labs [facility] Mid march 2020   Take care and f/u in 6 months sooner if needed   Influenza, Adult Influenza, more commonly known as "the flu," is a viral infection that mainly affects the respiratory tract. The respiratory tract includes organs that help you breathe, such as the lungs, nose, and throat. The flu causes many symptoms similar to the common cold along with high fever and body aches. The flu spreads easily from person to person (is contagious). Getting a flu shot (influenza vaccination) every year is the best way to prevent the flu. What are the causes? This condition is caused by the influenza virus. You can get the virus by:  Breathing in droplets that are in the air from an infected person's cough or sneeze.  Touching something that has been exposed to the virus (has been contaminated) and then touching your mouth, nose, or eyes. What increases the risk? The following factors may make you more likely to get the flu:  Not washing or sanitizing your hands often.  Having close contact with many people during cold and flu season.  Touching your mouth, eyes, or nose without first washing or sanitizing your hands.  Not getting a yearly (annual) flu shot. You may have a higher risk for the flu, including serious problems such as a lung infection (pneumonia), if you:  Are older than 65.  Are pregnant.  Have a weakened disease-fighting system (immune system). You may have a weakened immune system if you: ? Have HIV or AIDS. ? Are undergoing chemotherapy. ? Are taking medicines that reduce (suppress) the activity of your immune system.  Have a long-term (chronic) illness, such as heart disease, kidney disease, diabetes, or lung disease.  Have a liver disorder.  Are severely overweight (morbidly obese).  Have anemia. This is a condition that affects your red blood cells.  Have asthma. What are the signs or  symptoms? Symptoms of this condition usually begin suddenly and last 4-14 days. They may include:  Fever and chills.  Headaches, body aches, or muscle aches.  Sore throat.  Cough.  Runny or stuffy (congested) nose.  Chest discomfort.  Poor appetite.  Weakness or fatigue.  Dizziness.  Nausea or vomiting. How is this diagnosed? This condition may be diagnosed based on:  Your symptoms and medical history.  A physical exam.  Swabbing your nose or throat and testing the fluid for the influenza virus. How is this treated? If the flu is diagnosed early, you can be treated with medicine that can help reduce how severe the illness is and how long it lasts (antiviral medicine). This may be given by mouth (orally) or through an IV. Taking care of yourself at home can help relieve symptoms. Your health care provider may recommend:  Taking over-the-counter medicines.  Drinking plenty of fluids. In many cases, the flu goes away on its own. If you have severe symptoms or complications, you may be treated in a hospital. Follow these instructions at home: Activity  Rest as needed and get plenty of sleep.  Stay home from work or school as told by your health care provider. Unless you are visiting your health care provider, avoid leaving home until your fever has been gone for 24 hours without taking medicine. Eating and drinking  Take an oral rehydration solution (ORS). This is a drink that is sold at pharmacies and retail stores.  Drink enough fluid to keep your urine  pale yellow.  Drink clear fluids in small amounts as you are able. Clear fluids include water, ice chips, diluted fruit juice, and low-calorie sports drinks.  Eat bland, easy-to-digest foods in small amounts as you are able. These foods include bananas, applesauce, rice, lean meats, toast, and crackers.  Avoid drinking fluids that contain a lot of sugar or caffeine, such as energy drinks, regular sports drinks, and  soda.  Avoid alcohol.  Avoid spicy or fatty foods. General instructions      Take over-the-counter and prescription medicines only as told by your health care provider.  Use a cool mist humidifier to add humidity to the air in your home. This can make it easier to breathe.  Cover your mouth and nose when you cough or sneeze.  Wash your hands with soap and water often, especially after you cough or sneeze. If soap and water are not available, use alcohol-based hand sanitizer.  Keep all follow-up visits as told by your health care provider. This is important. How is this prevented?   Get an annual flu shot. You may get the flu shot in late summer, fall, or winter. Ask your health care provider when you should get your flu shot.  Avoid contact with people who are sick during cold and flu season. This is generally fall and winter. Contact a health care provider if:  You develop new symptoms.  You have: ? Chest pain. ? Diarrhea. ? A fever.  Your cough gets worse.  You produce more mucus.  You feel nauseous or you vomit. Get help right away if:  You develop shortness of breath or difficulty breathing.  Your skin or nails turn a bluish color.  You have severe pain or stiffness in your neck.  You develop a sudden headache or sudden pain in your face or ear.  You cannot eat or drink without vomiting. Summary  Influenza, more commonly known as "the flu," is a viral infection that primarily affects your respiratory tract.  Symptoms of the flu usually begin suddenly and last 4-14 days.  Getting an annual flu shot is the best way to prevent getting the flu.  Stay home from work or school as told by your health care provider. Unless you are visiting your health care provider, avoid leaving home until your fever has been gone for 24 hours without taking medicine.  Keep all follow-up visits as told by your health care provider. This is important. This information is not  intended to replace advice given to you by your health care provider. Make sure you discuss any questions you have with your health care provider. Document Released: 02/25/2000 Document Revised: 08/15/2017 Document Reviewed: 08/15/2017 Elsevier Interactive Patient Education  2019 [employer].

## 2018-04-27 ENCOUNTER — Encounter: Payer: Self-pay | Admitting: Internal Medicine

## 2018-04-28 ENCOUNTER — Other Ambulatory Visit: Payer: Self-pay | Admitting: Internal Medicine

## 2018-04-28 DIAGNOSIS — G4700 Insomnia, unspecified: Secondary | ICD-10-CM

## 2018-04-28 MED ORDER — ESZOPICLONE 2 MG PO TABS: 2.0000 mg | ORAL_TABLET | Freq: Every evening | ORAL | 5 refills | Status: DC | PRN

## 2018-04-29 MED FILL — ESZOPICLONE 2 MG TAB: 2 | 30 days supply | Qty: 30 | Fill #0

## 2018-04-29 MED FILL — ALPRAZolam 0.25 MG TABS: 0.25 | 30 days supply | Qty: 30 | Fill #2

## 2018-05-04 ENCOUNTER — Encounter: Payer: Self-pay | Admitting: Internal Medicine

## 2018-05-04 DIAGNOSIS — K219 Gastro-esophageal reflux disease without esophagitis: Secondary | ICD-10-CM

## 2018-05-07 MED ORDER — PANTOPRAZOLE SODIUM 40 MG PO TBEC: 40.0000 mg | DELAYED_RELEASE_TABLET | Freq: Every day | ORAL | 1 refills | Status: DC

## 2018-05-07 MED FILL — PANTOPRAZOLE SOD DR 40 MG T: 40 | 90 days supply | Qty: 90 | Fill #0 | Status: TO

## 2018-05-07 NOTE — Addendum Note (Signed)
Addended by: Elpidio Galea T on: 05/07/2018 08:12 AM   Modules accepted: Orders

## 2018-05-13 ENCOUNTER — Encounter: Payer: Self-pay | Admitting: Internal Medicine

## 2018-05-14 ENCOUNTER — Other Ambulatory Visit: Payer: Self-pay | Admitting: Internal Medicine

## 2018-05-14 DIAGNOSIS — M25511 Pain in right shoulder: Secondary | ICD-10-CM

## 2018-05-14 DIAGNOSIS — M25521 Pain in right elbow: Secondary | ICD-10-CM

## 2018-05-15 ENCOUNTER — Ambulatory Visit (HOSPITAL_COMMUNITY)
Admission: RE | Admit: 2018-05-15 | Discharge: 2018-05-15 | Disposition: A | Discharge status: Home / Self Care | Payer: No Typology Code available for payment source | Source: Ambulatory Visit | Attending: Internal Medicine | Admitting: Internal Medicine

## 2018-05-15 DIAGNOSIS — M25521 Pain in right elbow: Secondary | ICD-10-CM | POA: Diagnosis present

## 2018-05-15 DIAGNOSIS — M25511 Pain in right shoulder: Secondary | ICD-10-CM | POA: Insufficient documentation

## 2018-05-29 ENCOUNTER — Other Ambulatory Visit: Payer: Self-pay | Admitting: Internal Medicine

## 2018-05-29 DIAGNOSIS — K219 Gastro-esophageal reflux disease without esophagitis: Secondary | ICD-10-CM

## 2018-05-29 DIAGNOSIS — K3189 Other diseases of stomach and duodenum: Secondary | ICD-10-CM

## 2018-05-29 MED FILL — ESZOPICLONE 2 MG TAB: 2 | 30 days supply | Qty: 30 | Fill #1

## 2018-05-29 MED FILL — CLIND PH-BENZOYL PEROX 1.2-: 1.2-5 | 90 days supply | Qty: 45 | Fill #3

## 2018-05-31 ENCOUNTER — Other Ambulatory Visit: Payer: Self-pay | Admitting: Internal Medicine

## 2018-05-31 DIAGNOSIS — F419 Anxiety disorder, unspecified: Secondary | ICD-10-CM

## 2018-05-31 MED ORDER — ALPRAZOLAM 0.25 MG PO TABS: 0.2500 mg | ORAL_TABLET | Freq: Every day | ORAL | 2 refills | Status: DC | PRN

## 2018-05-31 MED FILL — ALPRAZolam 0.25 MG TABS: 0.25 | 30 days supply | Qty: 30 | Fill #0 | Status: TO

## 2018-06-14 ENCOUNTER — Encounter: Payer: Self-pay | Admitting: Internal Medicine

## 2018-06-19 MED FILL — SERTRALINE HCL 50 MG TABLET: 50 | 90 days supply | Qty: 90 | Fill #0

## 2018-06-19 MED FILL — NORETHINDRONE 0.35 MG TAB: 0.35 | 84 days supply | Qty: 84 | Fill #0

## 2018-06-19 MED FILL — METHYLPREDNISOLONE 4 MG TBP: 4 | 6 days supply | Qty: 21 | Fill #0

## 2018-07-03 MED FILL — TRETINOIN 0.05 % CREA: 0.05 | 30 days supply | Qty: 45 | Fill #0

## 2018-07-11 ENCOUNTER — Encounter: Payer: Self-pay | Admitting: Internal Medicine

## 2018-07-12 ENCOUNTER — Other Ambulatory Visit: Payer: Self-pay | Admitting: Internal Medicine

## 2018-07-12 DIAGNOSIS — R198 Other specified symptoms and signs involving the digestive system and abdomen: Secondary | ICD-10-CM

## 2018-07-12 DIAGNOSIS — K219 Gastro-esophageal reflux disease without esophagitis: Secondary | ICD-10-CM

## 2018-07-22 ENCOUNTER — Other Ambulatory Visit: Payer: Self-pay

## 2018-07-22 ENCOUNTER — Encounter: Payer: Self-pay | Admitting: Internal Medicine

## 2018-07-22 ENCOUNTER — Ambulatory Visit (INDEPENDENT_AMBULATORY_CARE_PROVIDER_SITE_OTHER): Payer: No Typology Code available for payment source | Admitting: Internal Medicine

## 2018-07-22 VITALS — Ht 63.0 in | Wt 193.0 lb

## 2018-07-22 DIAGNOSIS — K3189 Other diseases of stomach and duodenum: Secondary | ICD-10-CM

## 2018-07-22 DIAGNOSIS — K219 Gastro-esophageal reflux disease without esophagitis: Secondary | ICD-10-CM

## 2018-07-22 MED ORDER — PANTOPRAZOLE SODIUM 40 MG PO TBEC: 40.0000 mg | DELAYED_RELEASE_TABLET | Freq: Every day | ORAL | 3 refills | Status: DC

## 2018-07-22 NOTE — Patient Instructions (Signed)
If you are age 44 or older, your body mass index should be between 23-30. Your Body mass index is 34.19 kg/m. If this is out of the aforementioned range listed, please consider follow up with your Primary Care Provider.  If you are age 40 or younger, your body mass index should be between 19-25. Your Body mass index is 34.19 kg/m. If this is out of the aformentioned range listed, please consider follow up with your Primary Care Provider.   We have sent the following medications to your pharmacy for you to pick up at your convenience: Pantoprazole  Reflux precautions ( see attached )  Follow up in one year, or sooner if needed.\\Thank you for choosing me and [facility].   Scarlette Shorts, MD   Gastroesophageal Reflux Disease, Adult Gastroesophageal reflux (GER) happens when acid from the stomach flows up into the tube that connects the mouth and the stomach (esophagus). Normally, food travels down the esophagus and stays in the stomach to be digested. However, when a person has GER, food and stomach acid sometimes move back up into the esophagus. If this becomes a more serious problem, the person may be diagnosed with a disease called gastroesophageal reflux disease (GERD). GERD occurs when the reflux:  Happens often.  Causes frequent or severe symptoms.  Causes problems such as damage to the esophagus. When stomach acid comes in contact with the esophagus, the acid may cause soreness (inflammation) in the esophagus. Over time, GERD may create small holes (ulcers) in the lining of the esophagus. What are the causes? This condition is caused by a problem with the muscle between the esophagus and the stomach (lower esophageal sphincter, or LES). Normally, the LES muscle closes after food passes through the esophagus to the stomach. When the LES is weakened or abnormal, it does not close properly, and that allows food and stomach acid to go back up into the esophagus. The LES can be  weakened by certain dietary substances, medicines, and medical conditions, including:  Tobacco use.  Pregnancy.  Having a hiatal hernia.  Alcohol use.  Certain foods and beverages, such as coffee, chocolate, onions, and peppermint. What increases the risk? You are more likely to develop this condition if you:  Have an increased body weight.  Have a connective tissue disorder.  Use NSAID medicines. What are the signs or symptoms? Symptoms of this condition include:  Heartburn.  Difficult or painful swallowing.  The feeling of having a lump in the throat.  Abitter taste in the mouth.  Bad breath.  Having a large amount of saliva.  Having an upset or bloated stomach.  Belching.  Chest pain. Different conditions can cause chest pain. Make sure you see your health care provider if you experience chest pain.  Shortness of breath or wheezing.  Ongoing (chronic) cough or a night-time cough.  Wearing away of tooth enamel.  Weight loss. How is this diagnosed? Your health care provider will take a medical history and perform a physical exam. To determine if you have mild or severe GERD, your health care provider may also monitor how you respond to treatment. You may also have tests, including:  A test to examine your stomach and esophagus with a small camera (endoscopy).  A test thatmeasures the acidity level in your esophagus.  A test thatmeasures how much pressure is on your esophagus.  A barium swallow or modified barium swallow test to show the shape, size, and functioning of your esophagus. How is this treated?  The goal of treatment is to help relieve your symptoms and to prevent complications. Treatment for this condition may vary depending on how severe your symptoms are. Your health care provider may recommend:  Changes to your diet.  Medicine.  Surgery. Follow these instructions at home: Eating and drinking   Follow a diet as recommended by your  health care provider. This may involve avoiding foods and drinks such as: ? Coffee and tea (with or without caffeine). ? Drinks that containalcohol. ? Energy drinks and sports drinks. ? Carbonated drinks or sodas. ? Chocolate and cocoa. ? Peppermint and mint flavorings. ? Garlic and onions. ? Horseradish. ? Spicy and acidic foods, including peppers, chili powder, curry powder, vinegar, hot sauces, and barbecue sauce. ? Citrus fruit juices and citrus fruits, such as oranges, lemons, and limes. ? Tomato-based foods, such as red sauce, chili, salsa, and pizza with red sauce. ? Fried and fatty foods, such as donuts, french fries, potato chips, and high-fat dressings. ? High-fat meats, such as hot dogs and fatty cuts of red and white meats, such as rib eye steak, sausage, ham, and bacon. ? High-fat dairy items, such as whole milk, butter, and cream cheese.  Eat small, frequent meals instead of large meals.  Avoid drinking large amounts of liquid with your meals.  Avoid eating meals during the 2-3 hours before bedtime.  Avoid lying down right after you eat.  Do not exercise right after you eat. Lifestyle   Do not use any products that contain nicotine or tobacco, such as cigarettes, e-cigarettes, and chewing tobacco. If you need help quitting, ask your health care provider.  Try to reduce your stress by using methods such as yoga or meditation. If you need help reducing stress, ask your health care provider.  If you are overweight, reduce your weight to an amount that is healthy for you. Ask your health care provider for guidance about a safe weight loss goal. General instructions  Pay attention to any changes in your symptoms.  Take over-the-counter and prescription medicines only as told by your health care provider. Do not take aspirin, ibuprofen, or other NSAIDs unless your health care provider told you to do so.  Wear loose-fitting clothing. Do not wear anything tight around  your waist that causes pressure on your abdomen.  Raise (elevate) the head of your bed about 6 inches (15 cm).  Avoid bending over if this makes your symptoms worse.  Keep all follow-up visits as told by your health care provider. This is important. Contact a health care provider if:  You have: ? New symptoms. ? Unexplained weight loss. ? Difficulty swallowing or it hurts to swallow. ? Wheezing or a persistent cough. ? A hoarse voice.  Your symptoms do not improve with treatment. Get help right away if you:  Have pain in your arms, neck, jaw, teeth, or back.  Feel sweaty, dizzy, or light-headed.  Have chest pain or shortness of breath.  Vomit and your vomit looks like blood or coffee grounds.  Faint.  Have stool that is bloody or black.  Cannot swallow, drink, or eat. Summary  Gastroesophageal reflux happens when acid from the stomach flows up into the esophagus. GERD is a disease in which the reflux happens often, causes frequent or severe symptoms, or causes problems such as damage to the esophagus.  Treatment for this condition may vary depending on how severe your symptoms are. Your health care provider may recommend diet and lifestyle changes, medicine, or surgery.  Contact a health care provider if you have new or worsening symptoms.  Take over-the-counter and prescription medicines only as told by your health care provider. Do not take aspirin, ibuprofen, or other NSAIDs unless your health care provider told you to do so.  Keep all follow-up visits as told by your health care provider. This is important. This information is not intended to replace advice given to you by your health care provider. Make sure you discuss any questions you have with your health care provider. Document Released: 12/07/2004 Document Revised: 09/05/2017 Document Reviewed: 09/05/2017 Elsevier Interactive Patient Education  2019 [employer].

## 2018-07-22 NOTE — Progress Notes (Signed)
HISTORY OF PRESENT ILLNESS:  Cathy Guerrero is a pleasant 43 y.o. female, respiratory therapist at [employer], who schedules this WebEx telehealth medicine visit during the coronavirus pandemic for follow-up regarding GERD and small gastric lesions.  Patient is status post prior sleeve gastrectomy elsewhere.  She has chronic GERD for which she takes pantoprazole 40 mg daily.  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  Esophagus was normal.  D the stomach revealed evidence of prior sleeve gastrectomy.  There was a 15 mm a 5 mm antral prepyloric lesions noted.  Superficial biopsies were negative for worrisome findings.  Follow-up at this time.  Patient tells me that she has had weight gain due to suboptimal diet.  Otherwise doing well.  No active reflux symptoms.  GI review of systems is otherwise negative.  She does request medication refill  REVIEW OF SYSTEMS:  All non-GI ROS negative unless otherwise stated in the HPI except for anxiety  Past Medical History:  Diagnosis Date  . Anemia   . Anxiety   . Fibromyalgia   . Frequent headaches    Followed by Neurology - in [place] - stress  . GERD (gastroesophageal reflux disease)   . Hypertension 3 years ago  . Ovarian cyst   . Rheumatoid arthritis ([place]) From [facility]    Remission 2 years; saw Dr. Wadie Lessen [place] was on MTX, Humira, Prednisone; normal ESR 8, anti CCP neg, CRP neg, RF negative , ANA neg with h/o +ANA, anti DS DNA 08/2008  . [facility] spotted fever 5 years ago   +IgM serologies   . Sleep apnea    RESOLVED SINCE GASTRIC BYPASS SURGERY IN 2015  . SVT (supraventricular tachycardia) ([place]) Noticed 3 years ago   adenosine sensitive short PR SVT docymented by EKG 4/17  . Syncope and collapse 3 years ago - Happened about 2 times - Last time    Seen by neuro and cardiology - increased heart rate related    Past Surgical History:   Procedure Laterality Date  . APPENDECTOMY  1984  . CARDIAC ELECTROPHYSIOLOGY MAPPING AND ABLATION N/A 06/05/2017  . CHOLECYSTECTOMY N/A 09/01/2014   Procedure: LAPAROSCOPIC CHOLECYSTECTOMY;  Surgeon: Bonner Puna, MD;  Location: [facility] ORS;  Service: General;  Laterality: N/A;  . ENDOMETRIAL ABLATION  2010? Pt unsure  . LAPAROSCOPIC GASTRIC SLEEVE RESECTION     longitudinal gastrectomy/lap restrictive 01/27/14 Dr. Kreg Shropshire  . MOUTH SURGERY    . REDUCTION MAMMAPLASTY Bilateral 2003  . SVT ABLATION N/A 06/05/2017   Procedure: SVT ABLATION;  Surgeon: Thompson Grayer, MD;  Location: [facility];  Service: Cardiovascular;  Laterality: N/A;  . TUBAL LIGATION  2001    Social History Cathy Guerrero  reports that she has never smoked. She has never used smokeless tobacco. She reports that she does not drink alcohol or use drugs.  family history includes Arthritis in her maternal grandfather and mother; Breast cancer in her maternal grandmother; COPD in her father; Depression in her mother; Diabetes in her father, maternal grandfather, and mother; Heart attack in her father, maternal grandmother, and mother; Heart disease in her father and mother; Hyperlipidemia in her father and mother; Hypertension in her father and mother; Kidney disease in her father and mother; Ovarian cancer in her sister; Stroke in her mother; Uterine cancer in her sister.  Allergies  Allergen Reactions  . Amoxicillin Hives  .  Other Other (See Comments)    novacaine   . Enbrel [Etanercept] Hives and Rash       PHYSICAL EXAMINATION: Physical examination with telemedicine visit  ASSESSMENT:  1.  GERD.  Symptoms controlled with pantoprazole 40 mg daily 2.  Status post proximal gastrectomy for obesity 3.  Small mucosal and submucosal gastric nodules   PLAN:  1.  Reflux nausea 2.  REFILL PANTOPRAZOLE 40 mg daily; #30; 11 refills 3.  Routine office follow-up 1 year.  May consider relook endoscopy at that time  to assure stability of small submucosal gastric lesion  This WebEx A/V telehealth visit was scheduled by and consented for by the patient.  She was in her home and I was in my office during the encounter.  She understands there may be an associated professional charge for this service

## 2018-07-22 NOTE — Addendum Note (Signed)
Addended by: Candie Mile on: 07/22/2018 12:08 PM   Modules accepted: Orders

## 2018-08-09 MED FILL — PANTOPRAZOLE SOD DR 40 MG T: 40 | 90 days supply | Qty: 90 | Fill #0

## 2018-08-14 MED FILL — ALPRAZolam 0.25 MG TABS: 0.25 | 30 days supply | Qty: 30 | Fill #0

## 2018-09-04 MED FILL — ESZOPICLONE 2 MG TABLET: 2 | 30 days supply | Qty: 30 | Fill #0

## 2018-09-16 MED FILL — ALPRAZolam 0.25 MG TABS: 0.25 | 30 days supply | Qty: 30 | Fill #1

## 2018-09-17 ENCOUNTER — Other Ambulatory Visit: Payer: Self-pay | Admitting: Internal Medicine

## 2018-09-17 DIAGNOSIS — F320 Major depressive disorder, single episode, mild: Secondary | ICD-10-CM

## 2018-09-17 MED ORDER — SERTRALINE HCL 50 MG PO TABS: 50.0000 mg | ORAL_TABLET | Freq: Every day | ORAL | 3 refills | Status: DC

## 2018-09-17 MED FILL — NORETHINDRONE 0.35 MG TAB: 0.35 | 84 days supply | Qty: 84 | Fill #0

## 2018-09-17 MED FILL — SERTRALINE HCL 50 MG TABLET: 50 | 90 days supply | Qty: 90 | Fill #0

## 2018-10-01 ENCOUNTER — Encounter: Payer: Self-pay | Admitting: Internal Medicine

## 2018-10-01 DIAGNOSIS — N809 Endometriosis, unspecified: Secondary | ICD-10-CM | POA: Insufficient documentation

## 2018-10-02 ENCOUNTER — Other Ambulatory Visit (HOSPITAL_COMMUNITY)
Admission: RE | Admit: 2018-10-02 | Discharge: 2018-10-02 | Disposition: A | Discharge status: Home / Self Care | Payer: No Typology Code available for payment source | Source: Ambulatory Visit | Attending: Internal Medicine | Admitting: Internal Medicine

## 2018-10-02 DIAGNOSIS — E611 Iron deficiency: Secondary | ICD-10-CM | POA: Insufficient documentation

## 2018-10-02 DIAGNOSIS — E538 Deficiency of other specified B group vitamins: Secondary | ICD-10-CM | POA: Insufficient documentation

## 2018-10-02 DIAGNOSIS — E559 Vitamin D deficiency, unspecified: Secondary | ICD-10-CM | POA: Insufficient documentation

## 2018-10-02 LAB — COMPREHENSIVE METABOLIC PANEL
ALT: 19 U/L (ref 0–44)
AST: 15 U/L (ref 15–41)
Albumin: 4.3 g/dL (ref 3.5–5.0)
Alkaline Phosphatase: 51 U/L (ref 38–126)
Anion gap: 10 (ref 5–15)
BUN: 19 mg/dL (ref 6–20)
CO2: 24 mmol/L (ref 22–32)
Calcium: 9.2 mg/dL (ref 8.9–10.3)
Chloride: 104 mmol/L (ref 98–111)
Creatinine, Ser: 0.67 mg/dL (ref 0.44–1.00)
GFR calc Af Amer: >60 mL/min (ref >60)
GFR calc non Af Amer: >60 mL/min (ref >60)
Glucose, Bld: 100 mg/dL — ABNORMAL HIGH (ref 70–99)
Potassium: 4.2 mmol/L (ref 3.5–5.1)
Sodium: 138 mmol/L (ref 135–145)
Total Bilirubin: 0.9 mg/dL (ref 0.3–1.2)
Total Protein: 7.3 g/dL (ref 6.5–8.1)

## 2018-10-02 LAB — URINALYSIS, ROUTINE W REFLEX MICROSCOPIC
Bilirubin Urine: NEGATIVE
Glucose, UA: NEGATIVE mg/dL
Hgb urine dipstick: NEGATIVE
Ketones, ur: NEGATIVE mg/dL
Leukocytes,Ua: NEGATIVE
Nitrite: NEGATIVE
Protein, ur: NEGATIVE mg/dL
Specific Gravity, Urine: 1.023 (ref 1.005–1.030)
pH: 6 (ref 5.0–8.0)

## 2018-10-02 LAB — CBC WITH DIFFERENTIAL/PLATELET
Abs Immature Granulocytes: 0.03 10*3/uL (ref 0.00–0.07)
Basophils Absolute: 0.1 10*3/uL (ref 0.0–0.1)
Basophils Relative: 1 %
Eosinophils Absolute: 0.2 10*3/uL (ref 0.0–0.5)
Eosinophils Relative: 3 %
HCT: 40.3 % (ref 36.0–46.0)
Hemoglobin: 13 g/dL (ref 12.0–15.0)
Immature Granulocytes: 1 %
Lymphocytes Relative: 37 %
Lymphs Abs: 2.3 10*3/uL (ref 0.7–4.0)
MCH: 28.1 pg (ref 26.0–34.0)
MCHC: 32.3 g/dL (ref 30.0–36.0)
MCV: 87 fL (ref 80.0–100.0)
Monocytes Absolute: 0.5 10*3/uL (ref 0.1–1.0)
Monocytes Relative: 8 %
Neutro Abs: 3.3 10*3/uL (ref 1.7–7.7)
Neutrophils Relative %: 50 %
Platelets: 304 10*3/uL (ref 150–400)
RBC: 4.63 MIL/uL (ref 3.87–5.11)
RDW: 12.4 % (ref 11.5–15.5)
WBC: 6.4 10*3/uL (ref 4.0–10.5)
nRBC: 0 % (ref 0.0–0.2)

## 2018-10-02 LAB — LIPID PANEL
Cholesterol: 131 mg/dL (ref 0–200)
HDL: 47 mg/dL (ref >40)
LDL Cholesterol: 75 mg/dL (ref 0–99)
Total CHOL/HDL Ratio: 2.8 RATIO
Triglycerides: 44 mg/dL (ref <150)
VLDL: 9 mg/dL (ref 0–40)

## 2018-10-02 LAB — IRON AND TIBC
Iron: 109 ug/dL (ref 28–170)
Saturation Ratios: 25 % (ref 10.4–31.8)
TIBC: 431 ug/dL (ref 250–450)
UIBC: 322 ug/dL

## 2018-10-02 LAB — TSH: TSH: 1.635 u[IU]/mL (ref 0.350–4.500)

## 2018-10-02 LAB — VITAMIN B12: Vitamin B-12: 559 pg/mL (ref 180–914)

## 2018-10-02 LAB — HEMOGLOBIN A1C
Hgb A1c MFr Bld: 5.2 % (ref 4.8–5.6)
Mean Plasma Glucose: 102.54 mg/dL

## 2018-10-02 LAB — T4, FREE: Free T4: 1.15 ng/dL — ABNORMAL HIGH (ref 0.61–1.12)

## 2018-10-02 LAB — FERRITIN: Ferritin: 12 ng/mL (ref 11–307)

## 2018-10-03 LAB — MEASLES/MUMPS/RUBELLA IMMUNITY
Mumps IgG: 71.6 AU/mL (ref >10.9)
Rubella: 5.47 index (ref >0.99)
Rubeola IgG: 120 AU/mL (ref >16.4)

## 2018-10-03 LAB — IGG, IGA, IGM
IgA: 411 mg/dL — ABNORMAL HIGH (ref 87–352)
IgG (Immunoglobin G), Serum: 1189 mg/dL (ref 586–1602)
IgM (Immunoglobulin M), Srm: 65 mg/dL (ref 26–217)

## 2018-10-03 LAB — VITAMIN D 25 HYDROXY (VIT D DEFICIENCY, FRACTURES): Vit D, 25-Hydroxy: 19.6 ng/mL — ABNORMAL LOW (ref 30.0–100.0)

## 2018-10-08 ENCOUNTER — Telehealth: Payer: No Typology Code available for payment source | Admitting: Nurse Practitioner

## 2018-10-08 DIAGNOSIS — H60332 Swimmer's ear, left ear: Secondary | ICD-10-CM

## 2018-10-08 MED ORDER — CIPRODEX 0.3-0.1 % OT SUSP: 4.0000 [drp] | Freq: Two times a day (BID) | OTIC | 0 refills | Status: DC

## 2018-10-08 NOTE — Progress Notes (Signed)
E Visit for Swimmer's Ear  We are sorry that you are not feeling well. Here is how we plan to help!  I have prescribed: Ciprofloxin 0.2% and hydrocortisone 1% otic suspension 3 drops in affected ears twice daily for 7 days    In certain cases swimmer's ear may progress to a more serious bacterial infection of the middle or inner ear.  If you have a fever 102 and up and significantly worsening symptoms, this could indicate a more serious infection moving to the middle/inner and needs face to face evaluation in an office by a provider.  Your symptoms should improve over the next 3 days and should resolve in about 7 days.  HOME CARE:   Wash your hands frequently.  Do not place the tip of the bottle on your ear or touch it with your fingers.  You can take Acetominophen 650 mg every 4-6 hours as needed for pain.  If pain is severe or moderate, you can apply a heating pad (set on low) or hot water bottle (wrapped in a towel) to outer ear for 20 minutes.  This will also increase drainage.  Avoid ear plugs  Do not use Q-tips  After showers, help the water run out by tilting your head to one side.  GET HELP RIGHT AWAY IF:   Fever is over 102.2 degrees.  You develop progressive ear pain or hearing loss.  Ear symptoms persist longer than 3 days after treatment.  MAKE SURE YOU:   Understand these instructions.  Will watch your condition.  Will get help right away if you are not doing well or get worse.  TO PREVENT SWIMMER'S EAR:  Use a bathing cap or custom fitted swim molds to keep your ears dry.  Towel off after swimming to dry your ears.  Tilt your head or pull your earlobes to allow the water to escape your ear canal.  If there is still water in your ears, consider using a hairdryer on the lowest setting.  Thank you for choosing an e-visit. Your e-visit answers were reviewed by a board certified advanced clinical practitioner to complete your personal care plan.  Depending upon the condition, your plan could have included both over the counter or prescription medications. Please review your pharmacy choice. Be sure that the pharmacy you have chosen is open so that you can pick up your prescription now.  If there is a problem you may message your provider in MyChart to have the prescription routed to another pharmacy. Your safety is important to us. If you have drug allergies check your prescription carefully.  For the next 24 hours, you can use MyChart to ask questions about today's visit, request a non-urgent call back, or ask for a work or school excuse from your e-visit provider. You will get an email in the next two days asking about your experience. I hope that your e-visit has been valuable and will speed your recovery.   5-10 minutes spent reviewing and documenting in chart.     

## 2018-10-09 MED FILL — CIPRODEX OTIC SUSPENSION: 0.3-0.1 | 7 days supply | Qty: 8 | Fill #0

## 2018-10-09 MED FILL — ESZOPICLONE 2 MG TAB: 2 | 30 days supply | Qty: 30 | Fill #1

## 2018-10-10 ENCOUNTER — Encounter: Payer: Self-pay | Admitting: Internal Medicine

## 2018-10-10 ENCOUNTER — Other Ambulatory Visit: Payer: Self-pay

## 2018-10-10 ENCOUNTER — Ambulatory Visit (INDEPENDENT_AMBULATORY_CARE_PROVIDER_SITE_OTHER): Payer: No Typology Code available for payment source | Admitting: Internal Medicine

## 2018-10-10 VITALS — Wt 174.0 lb

## 2018-10-10 DIAGNOSIS — F320 Major depressive disorder, single episode, mild: Secondary | ICD-10-CM | POA: Diagnosis not present

## 2018-10-10 DIAGNOSIS — Z1231 Encounter for screening mammogram for malignant neoplasm of breast: Secondary | ICD-10-CM

## 2018-10-10 DIAGNOSIS — G4700 Insomnia, unspecified: Secondary | ICD-10-CM

## 2018-10-10 DIAGNOSIS — Z0000 Encounter for general adult medical examination without abnormal findings: Secondary | ICD-10-CM | POA: Insufficient documentation

## 2018-10-10 DIAGNOSIS — Z0001 Encounter for general adult medical examination with abnormal findings: Secondary | ICD-10-CM

## 2018-10-10 DIAGNOSIS — H9202 Otalgia, left ear: Secondary | ICD-10-CM

## 2018-10-10 DIAGNOSIS — F419 Anxiety disorder, unspecified: Secondary | ICD-10-CM | POA: Diagnosis not present

## 2018-10-10 MED ORDER — SERTRALINE HCL 50 MG PO TABS: 75.0000 mg | ORAL_TABLET | Freq: Every day | ORAL | 3 refills | Status: DC

## 2018-10-10 MED ORDER — ESZOPICLONE 2 MG PO TABS: 2.0000 mg | ORAL_TABLET | Freq: Every evening | ORAL | 5 refills | Status: DC | PRN

## 2018-10-10 MED ORDER — ALPRAZOLAM 0.25 MG PO TABS: 0.2500 mg | ORAL_TABLET | Freq: Every day | ORAL | 2 refills | Status: DC | PRN

## 2018-10-10 NOTE — Progress Notes (Signed)
Telephone Note  I connected with Cathy Guerrero  on 10/10/18 at 10:49 AM EDT by telephone and verified that I am speaking with the correct person using two identifiers.  Location patient: home Location provider:work or home office Persons participating in the virtual visit: patient, provider  I discussed the limitations of evaluation and management by telemedicine and the availability of in person appointments. The patient expressed understanding and agreed to proceed.   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 C/o left ear pain seen 10/08/18 given cipro drops and uses. She also takes allergy pill daily and has started generic flonase  3. Annual   ROS: See pertinent positives and negatives per HPI. General: wt down to 174 lsb  HEENT: left ear pain+  CV: no chest pain  Lungs: no sob  GI: no ab pain  GU: no issues  MSK: no joint pain Neuro: no h/a Skin: no issues  Psych: +anxiety/depression, +insomnia  Past Medical History:  Diagnosis Date  . Anemia   . Anxiety   . Endometriosis   . Fibromyalgia   . Frequent headaches    Followed by Neurology - in [place] - stress  . GERD (gastroesophageal reflux disease)   . Hypertension 3 years ago  . Ovarian cyst   . Rheumatoid arthritis ([facility]) From [facility]    Remission 2 years; saw Dr. Shroff [place] was on MTX, Humira, Prednisone; normal ESR 8, anti CCP neg, CRP neg, RF negative , ANA neg with h/o +ANA, anti DS DNA 08/2008  . [place] spotted fever 5 years ago   +IgM serologies   . Sleep apnea    RESOLVED SINCE GASTRIC BYPASS SURGERY IN 2015  . SVT (supraventricular tachycardia) ([facility]) Noticed 3 years ago   adenosine sensitive short PR SVT docymented by EKG 4/17  . Syncope and  collapse 3 years ago - Happened about 2 times - Last time    Seen by neuro and cardiology - increased heart rate related    Past Surgical History:  Procedure Laterality Date  . APPENDECTOMY  1984  . CARDIAC ELECTROPHYSIOLOGY MAPPING AND ABLATION N/A 06/05/2017  . CHOLECYSTECTOMY N/A 09/01/2014   Procedure: LAPAROSCOPIC CHOLECYSTECTOMY;  Surgeon: Jon M Bruce, MD;  Location: [facility] ORS;  Service: General;  Laterality: N/A;  . ENDOMETRIAL ABLATION  2010? Pt unsure  . LAPAROSCOPIC GASTRIC SLEEVE RESECTION     longitudinal gastrectomy/lap restrictive 01/27/14 Dr. Jon Bruce  . MOUTH SURGERY    . REDUCTION MAMMAPLASTY Bilateral 2003  . SVT ABLATION N/A 06/05/2017   Procedure: SVT ABLATION;  Surgeon: Allred, James, MD;  Location[facility];  Service: Cardiovascular;  Laterality: N/A;  . TUBAL LIGATION  2001    Family History  Problem Relation Age of Onset  . Heart disease Mother   . Heart attack Mother   . Stroke Mother   . Hypertension Mother   . Hyperlipidemia Mother   . Arthritis Mother        Rheumatoid arthritis  . Kidney disease Mother   . Depression Mother   . Diabetes Mother   . Heart disease Father   . Heart attack Father   . Hypertension Father   . Hyperlipidemia Father   . Kidney   disease Father   . Diabetes Father   . COPD Father   . Heart attack Maternal Grandmother   . Breast cancer Maternal Grandmother   . Arthritis Maternal Grandfather        rheumatoid arthritis  . Diabetes Maternal Grandfather   . Ovarian cancer Sister        1/2 sister  . Uterine cancer Sister        1/2 sister    SOCIAL HX:  RT with cone   Current Outpatient Medications:  .  ALPRAZolam (XANAX) 0.25 MG tablet, Take 1 tablet (0.25 mg total) by mouth daily as needed for anxiety., Disp: 30 tablet, Rfl: 2 .  Biotin 5000 MCG CAPS, Take 5,000 mcg by mouth daily. , Disp: , Rfl:  .  CALCIUM CARBONATE-VITAMIN D PO, Take 1 tablet by mouth daily., Disp: , Rfl:  .  Cetirizine HCl 10 MG  TBDP, Take by mouth., Disp: , Rfl:  .  ciprofloxacin-dexamethasone (CIPRODEX) OTIC suspension, Place 4 drops into the left ear 2 (two) times daily., Disp: 7.5 mL, Rfl: 0 .  Clindamycin-Benzoyl Per, Refr, gel, Apply 1 application topically 4 (four) times a week., Disp: , Rfl: 8 .  eszopiclone (LUNESTA) 2 MG TABS tablet, Take 1 tablet (2 mg total) by mouth at bedtime as needed for sleep. Take immediately before bedtime, Disp: 30 tablet, Rfl: 5 .  Magnesium 250 MG TABS, Take 250 mg by mouth daily. , Disp: , Rfl:  .  Multiple Vitamin (MULTIVITAMIN) tablet, Take 1 tablet by mouth daily., Disp: , Rfl:  .  norethindrone (MICRONOR,CAMILA,ERRIN) 0.35 MG tablet, Take 1 tablet by mouth daily. , Disp: , Rfl:  .  pantoprazole (PROTONIX) 40 MG tablet, Take 1 tablet (40 mg total) by mouth daily. 30 min. Before food, Disp: 90 tablet, Rfl: 3 .  Pyridoxine HCl (VITAMIN B-6 PO), Take by mouth daily., Disp: , Rfl:  .  sertraline (ZOLOFT) 50 MG tablet, Take 1.5 tablets (75 mg total) by mouth daily., Disp: 90 tablet, Rfl: 3 .  tretinoin (RETIN-A) 0.05 % cream, Apply 1 application topically daily as needed (for face)., Disp: , Rfl:  .  vitamin B-12 (CYANOCOBALAMIN) 100 MCG tablet, Take 1,000 mcg by mouth daily., Disp: , Rfl:   EXAM:  VITALS per patient if applicable:  GENERAL: alert, oriented, appears well and in no acute distress  PSYCH/NEURO: pleasant and cooperative, no obvious depression or anxiety via telephone more subjective, speech and thought processing grossly intact  ASSESSMENT AND PLAN:  Discussed the following assessment and plan:  Annual physical exam - Plan:  Had flu shot 11/2017 Tdap utd  Declines HPV vx  Hep B immune  MMR immune  Pap had 02/23/16 neg no h/o abnormal pap except 1991 possibly related to OCP use but resolved per pt due for repeat in 02/23/2019   -saw [facility] ob[facility]gyn 10/01/18 for endometriosis Dr. Laddie Aquas refilled norethindrone 0.35 qd for endometrosis no pap done  pelvic performed 174 wt doing some cardio as of 10/10/2018   Mammo due 10/02/17 negative  Obtained notes from [facility]ology and arthritis center (Dr. Scarlette Shorts) now f/u rheumatology [place]/Dr. Sherilyn Dacosta dermatology near [place] in [place] in 2019   Cont healthy diet and exercise   Anxiety - Plan: ALPRAZolam (XANAX) 0.25 MG tablet qd prn   Depression, major, single episode, mild ([place]) - Plan: sertraline (ZOLOFT) 50 MG tablet increase to 75 mg qd   Insomnia, unspecified type - Plan: eszopiclone (LUNESTA) 2 MG TABS tablet  qhs prn   Left ear pain - Plan: ? OM vs ETD ciprodex drops and rec otc antihistamine and flonse   I discussed the assessment and treatment plan with the patient. The patient was provided an opportunity to ask questions and all were answered. The patient agreed with the plan and demonstrated an understanding of the instructions.   The patient was advised to call back or seek an in-person evaluation if the symptoms worsen or if the condition fails to improve as anticipated.  Time spent 15 minutes  Delorise Jackson, MD

## 2018-10-12 MED FILL — ALPRAZolam 0.25 MG TABS: 0.25 | 30 days supply | Qty: 30 | Fill #0

## 2018-11-04 MED FILL — PANTOPRAZOLE SOD DR 40 MG T: 40 | 90 days supply | Qty: 90 | Fill #0

## 2018-11-12 ENCOUNTER — Encounter: Payer: Self-pay | Admitting: Internal Medicine

## 2018-11-15 ENCOUNTER — Other Ambulatory Visit: Payer: Self-pay | Admitting: Internal Medicine

## 2018-11-15 DIAGNOSIS — F320 Major depressive disorder, single episode, mild: Secondary | ICD-10-CM

## 2018-11-15 MED ORDER — SERTRALINE HCL 100 MG PO TABS: 100.0000 mg | ORAL_TABLET | Freq: Every day | ORAL | 3 refills | Status: DC

## 2018-11-15 MED ORDER — BUPROPION HCL ER (XL) 150 MG PO TB24: 150.0000 mg | ORAL_TABLET | Freq: Every day | ORAL | 3 refills | Status: DC

## 2018-11-15 MED FILL — SERTRALINE HCL 100 MG TAB: 100 | 90 days supply | Qty: 90 | Fill #0

## 2018-11-15 MED FILL — buPROPion HCL ER (XL) 150 M: 150 | 90 days supply | Qty: 90 | Fill #0

## 2018-11-26 MED FILL — ALPRAZolam 0.25 MG TABS: 0.25 | 30 days supply | Qty: 30 | Fill #1

## 2018-11-29 ENCOUNTER — Other Ambulatory Visit: Payer: Self-pay

## 2018-11-29 ENCOUNTER — Ambulatory Visit
Admission: RE | Admit: 2018-11-29 | Discharge: 2018-11-29 | Disposition: A | Discharge status: Home / Self Care | Payer: No Typology Code available for payment source | Source: Ambulatory Visit | Attending: Internal Medicine | Admitting: Internal Medicine

## 2018-11-29 DIAGNOSIS — Z1231 Encounter for screening mammogram for malignant neoplasm of breast: Secondary | ICD-10-CM

## 2018-12-19 MED FILL — ALPRAZolam 0.25 MG TABS: 0.25 | 30 days supply | Qty: 30 | Fill #2

## 2018-12-31 ENCOUNTER — Encounter: Payer: Self-pay | Admitting: Internal Medicine

## 2018-12-31 ENCOUNTER — Other Ambulatory Visit: Payer: Self-pay | Admitting: Internal Medicine

## 2018-12-31 DIAGNOSIS — B001 Herpesviral vesicular dermatitis: Secondary | ICD-10-CM

## 2018-12-31 MED ORDER — VALACYCLOVIR HCL 500 MG PO TABS: 500.0000 mg | ORAL_TABLET | Freq: Two times a day (BID) | ORAL | 11 refills | Status: DC

## 2019-01-03 MED FILL — NORETHINDRONE 0.35 MG TAB: 0.35 | 84 days supply | Qty: 84 | Fill #0

## 2019-01-03 MED FILL — ESZOPICLONE 2 MG TAB: 2 | 30 days supply | Qty: 30 | Fill #0

## 2019-02-03 MED FILL — ESZOPICLONE 2 MG TAB: 2 MG | 30 days supply | Qty: 30 | Fill #1

## 2019-02-03 MED FILL — PANTOPRAZOLE SOD DR 40 MG T: 40 | 90 days supply | Qty: 90 | Fill #1

## 2019-02-04 ENCOUNTER — Other Ambulatory Visit: Payer: Self-pay | Admitting: Internal Medicine

## 2019-02-04 DIAGNOSIS — F419 Anxiety disorder, unspecified: Secondary | ICD-10-CM

## 2019-02-04 MED ORDER — ALPRAZOLAM 0.25 MG PO TABS: 0.2500 mg | ORAL_TABLET | Freq: Every day | ORAL | 2 refills | Status: DC | PRN

## 2019-02-04 MED FILL — ALPRAZolam 0.25 MG TABS: 0.25 | 30 days supply | Qty: 30 | Fill #0

## 2019-02-13 ENCOUNTER — Ambulatory Visit (INDEPENDENT_AMBULATORY_CARE_PROVIDER_SITE_OTHER): Payer: No Typology Code available for payment source | Admitting: Internal Medicine

## 2019-02-13 ENCOUNTER — Encounter: Payer: Self-pay | Admitting: Internal Medicine

## 2019-02-13 VITALS — BP 137/72 | Ht 63.0 in | Wt 169.0 lb

## 2019-02-13 DIAGNOSIS — F320 Major depressive disorder, single episode, mild: Secondary | ICD-10-CM

## 2019-02-13 DIAGNOSIS — G4700 Insomnia, unspecified: Secondary | ICD-10-CM | POA: Diagnosis not present

## 2019-02-13 DIAGNOSIS — F419 Anxiety disorder, unspecified: Secondary | ICD-10-CM

## 2019-02-13 NOTE — Progress Notes (Signed)
Telephone Note  I connected with [name]   on 02/13/19 at 10:30 AM EST by telephone and verified that I am speaking with the correct person using two identifiers.  Location patient: home Location provider:work or home office Persons participating in the virtual visit: patient, provider  I discussed the limitations of evaluation and management by telemedicine and the availability of in person appointments. The patient expressed understanding and agreed to proceed.   HPI: 1. Anxiety/depression/insomnia PHQ 9 score <5 on wellbutrin 150 xl, lunesta 3 and zoloft 100 mg had several deaths in family brother in law age 63 AAA rupture and sister bio age 49 died 2019-02-10 cardiac arrest not doing well after husbands death she was smoking and ? Drugs tox report pending. The family is now trying to divide tasks of taking care of 47 y.o nephew, 84 y.o nephew and 83 y.o niece. She has custody of 31 y.o niece who has developmental issues she was not aware of and behaves like a 13 or 43 y.o.    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   Overall doing well and sleeping ok due to being so tired.    ROS: See pertinent positives and negatives per HPI.  Past Medical History:  Diagnosis Date  . Anemia   . Anxiety   . Endometriosis   . Fibromyalgia   . Frequent headaches    Followed by Neurology - in [place] - stress  . GERD (gastroesophageal reflux disease)   . Hypertension 3 years ago  . Ovarian cyst   . Rheumatoid arthritis ([place]) From [facility]    Remission 2 years; saw Dr. Wadie Lessen [place] was on MTX, Humira, Prednisone;  normal ESR 8, anti CCP neg, CRP neg, RF negative , ANA neg with h/o +ANA, anti DS DNA 08/2008  . [facility] spotted fever 5 years ago   +IgM serologies   . Sleep apnea    RESOLVED SINCE GASTRIC BYPASS SURGERY IN 2015  . SVT (supraventricular tachycardia) ([place]) Noticed 3 years ago   adenosine sensitive short PR SVT docymented by EKG 4/17  . Syncope and collapse 3 years ago - Happened about 2 times - Last time    Seen by neuro and cardiology - increased heart rate related    Past Surgical History:  Procedure Laterality Date  . APPENDECTOMY  1984  . CARDIAC ELECTROPHYSIOLOGY MAPPING AND ABLATION N/A 06/05/2017  . CHOLECYSTECTOMY N/A 09/01/2014   Procedure: LAPAROSCOPIC CHOLECYSTECTOMY;  Surgeon: Bonner Puna, MD;  Location: [facility] ORS;  Service: General;  Laterality: N/A;  . ENDOMETRIAL ABLATION  2010? Pt unsure  . LAPAROSCOPIC GASTRIC SLEEVE RESECTION     longitudinal gastrectomy/lap restrictive 01/27/14 Dr. Kreg Shropshire  . MOUTH SURGERY    . REDUCTION MAMMAPLASTY Bilateral 2003  . SVT ABLATION N/A 06/05/2017   Procedure: SVT ABLATION;  Surgeon: Thompson Grayer, MD;  Location: [place] [facility];  Service: Cardiovascular;  Laterality: N/A;  . TUBAL LIGATION  2001    Family History  Problem Relation Age of Onset  .  Heart disease Mother   . Heart attack Mother   . Stroke Mother   . Hypertension Mother   . Hyperlipidemia Mother   . Arthritis Mother        Rheumatoid arthritis  . Kidney disease Mother   . Depression Mother   . Diabetes Mother   . Heart disease Father   . Heart attack Father   . Hypertension Father   . Hyperlipidemia Father   . Kidney disease Father   . Diabetes Father   . COPD Father   . Heart attack Maternal Grandmother   . Breast cancer Maternal Grandmother   . Arthritis Maternal Grandfather        rheumatoid arthritis  . Diabetes Maternal Grandfather   . Sudden Cardiac Death Sister        died 01-30-2019 age 13  . Ovarian cancer Sister        1/2 sister  .  Uterine cancer Sister        1/2 sister    SOCIAL HX:  Born in raised in [place], [place]. Currently resides in [place], [place]. Lives with husband (married since age 4 y.o), Donnie, and 3 children. 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 Enjoys camping, shopping, reading, and involved in church. Likes to ride motorcycle with husband and wears helmet (as of 01/2017 sold motorcycle)  New grandchild boy Milford Cage as of 02/13/19 51 weeks old   Current Outpatient Medications:  .  ALPRAZolam (XANAX) 0.25 MG tablet, Take 1 tablet (0.25 mg total) by mouth daily as needed for anxiety., Disp: 30 tablet, Rfl: 2 .  Biotin 5000 MCG CAPS, Take 5,000 mcg by mouth daily. , Disp: , Rfl:  .  buPROPion (WELLBUTRIN XL) 150 MG 24 hr tablet, Take 1 tablet (150 mg total) by mouth daily., Disp: 90 tablet, Rfl: 3 .  CALCIUM CARBONATE-VITAMIN D PO, Take 1 tablet by mouth daily., Disp: , Rfl:  .  Cetirizine HCl 10 MG TBDP, Take by mouth., Disp: , Rfl:  .  Clindamycin-Benzoyl Per, Refr, gel, Apply 1 application topically 4 (four) times a week., Disp: , Rfl: 8 .  eszopiclone (LUNESTA) 2 MG TABS tablet, Take 1 tablet (2 mg total) by mouth at bedtime as needed for sleep. Take immediately before bedtime, Disp: 30 tablet, Rfl: 5 .  Magnesium 250 MG TABS, Take 250 mg by mouth daily. , Disp: , Rfl:  .  Multiple Vitamin (MULTIVITAMIN) tablet, Take 1 tablet by mouth daily., Disp: , Rfl:  .  norethindrone (MICRONOR,CAMILA,ERRIN) 0.35 MG tablet, Take 1 tablet by mouth daily. , Disp: , Rfl:  .  pantoprazole (PROTONIX) 40 MG tablet, Take 1 tablet (40 mg total) by mouth daily. 30 min. Before food, Disp: 90 tablet, Rfl: 3 .  Pyridoxine HCl (VITAMIN B-6 PO), Take by mouth daily., Disp: , Rfl:  .  sertraline (ZOLOFT) 100 MG tablet, Take 1 tablet (100 mg total) by mouth daily., Disp: 90 tablet, Rfl: 3 .  tretinoin (RETIN-A) 0.05 % cream, Apply 1 application topically daily as needed (for face)., Disp: , Rfl:  .   valACYclovir (VALTREX) 500 MG tablet, Take 1 tablet (500 mg total) by mouth 2 (two) times daily. X 3-7 days prn outbreak, Disp: 60 tablet, Rfl: 11 .  vitamin B-12 (CYANOCOBALAMIN) 100 MCG tablet, Take 1,000 mcg by mouth daily., Disp: , Rfl:   EXAM:  VITALS per patient if applicable:  GENERAL: alert, oriented, appears well and in no acute distress  PSYCH/NEURO: pleasant and  cooperative, no obvious depression or anxiety, speech and thought processing grossly intact  ASSESSMENT AND PLAN:  Discussed the following assessment and plan:  Anxiety Insomnia, unspecified type Depression, major, single episode, mild ([place]) -cont meds given name of Dr. Toy Care in [place] to call and sch psych/therapy 03/2019 when insurance changes  Cont same meds for now using lunesta prn mostly when she works  HM Had flu shot 11/2018 Tdaputd Declines HPV vx  Hep B immune  MMR immune  Pap had 02/23/16 neg no h/o abnormal pap except 1991 possibly related to OCP use but resolved per pt due for repeat in 02/23/2019 -saw [facility] ob/gyn 10/01/18 for endometriosis Dr. Laddie Aquas refilled norethindrone 0.35 qd for endometrosis no pap done pelvic performed174 wt doing some cardio as of 10/10/2018   Mammo 11/29/18 negative  Colonoscopy consider age 50 and up Dr. Henrene Pastor  Obtained notes from [facility]ology and arthritis center (Dr. Larey Seat f/u rheumatology [place]/Dr. Sherilyn Dacosta dermatology near [place] in [place] in 2019  Cont healthy diet and exercise    -we discussed possible serious and likely etiologies, options for evaluation and workup, limitations of telemedicine visit vs in person visit, treatment, treatment risks and precautions. Pt prefers to treat via telemedicine empirically rather then risking or undertaking an in person visit at this moment. Patient agrees to seek prompt in person care if worsening, new symptoms arise, or if is not improving with treatment.   I discussed the  assessment and treatment plan with the patient. The patient was provided an opportunity to ask questions and all were answered. The patient agreed with the plan and demonstrated an understanding of the instructions.   The patient was advised to call back or seek an in-person evaluation if the symptoms worsen or if the condition fails to improve as anticipated.  Time spent 20 minutes  Delorise Jackson, MD

## 2019-02-17 MED FILL — SERTRALINE HCL 100 MG TAB: 100 | 90 days supply | Qty: 90 | Fill #1

## 2019-02-17 MED FILL — buPROPion HCL ER (XL) 150 M: 150 | 90 days supply | Qty: 90 | Fill #1

## 2019-03-24 MED FILL — ALPRAZolam 0.25 MG TABS: 0.25 | 30 days supply | Qty: 30 | Fill #1

## 2019-03-28 MED FILL — NORETHINDRONE 0.35 MG TABS: 0.35 | 84 days supply | Qty: 84 | Fill #0

## 2019-04-26 MED FILL — ALPRAZolam 0.25 MG TABS: 0.25 | 30 days supply | Qty: 30 | Fill #2

## 2019-05-04 MED FILL — PANTOPRAZOLE SOD DR 40 MG T: 40 | 90 days supply | Qty: 90 | Fill #2

## 2019-05-12 MED FILL — buPROPion HCL ER (XL) 150 M: 150 | 90 days supply | Qty: 90 | Fill #2

## 2019-05-12 MED FILL — SERTRALINE HCL 100 MG TAB: 100 | 90 days supply | Qty: 90 | Fill #2

## 2019-05-14 DIAGNOSIS — H5213 Myopia, bilateral: Secondary | ICD-10-CM | POA: Diagnosis not present

## 2019-05-14 DIAGNOSIS — H40019 Open angle with borderline findings, low risk, unspecified eye: Secondary | ICD-10-CM | POA: Diagnosis not present

## 2019-05-16 MED FILL — CLIND PH-BENZOYL PEROX 1.2-: 1.2-5 | 30 days supply | Qty: 45 | Fill #0

## 2019-05-18 IMAGING — MG DIGITAL SCREENING BILATERAL MAMMOGRAM WITH TOMO AND CAD
8 series · 8 of 24 positions shown · non-contrast
Comparison: Previous exam(s).

CLINICAL DATA: Screening. Prior BILATERAL reduction mammoplasty in
0447.

EXAM:
DIGITAL SCREENING BILATERAL MAMMOGRAM WITH TOMO AND CAD

[L MLO synth-2D]
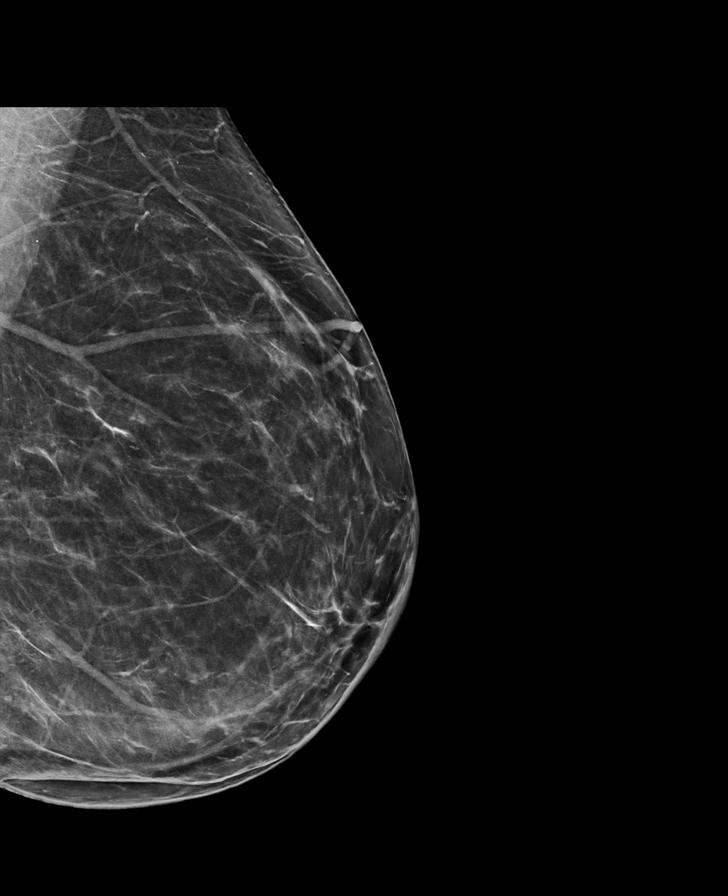

[L CC synth-2D]
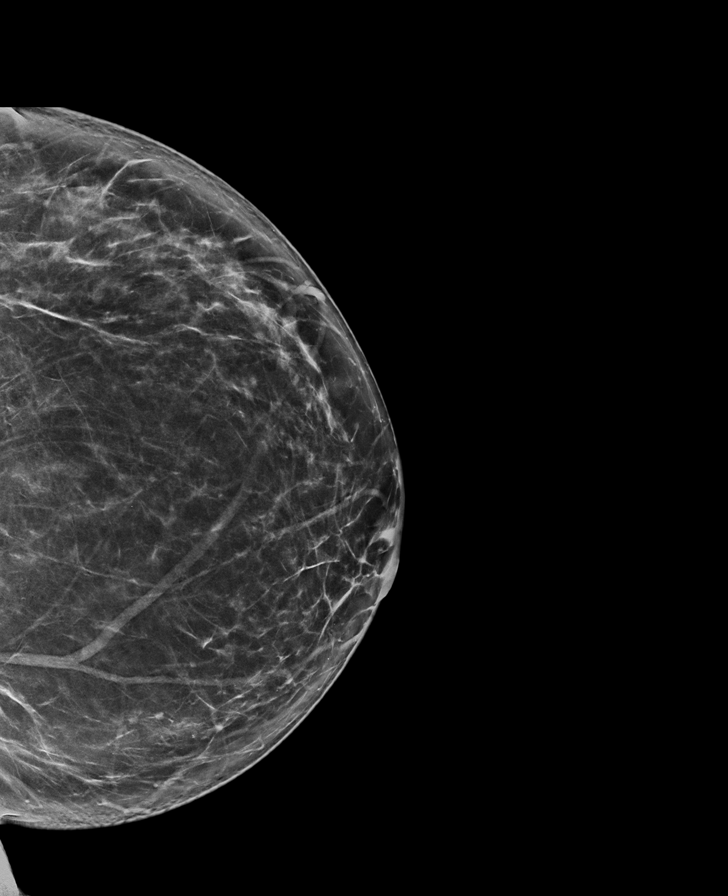

[R MLO synth-2D]
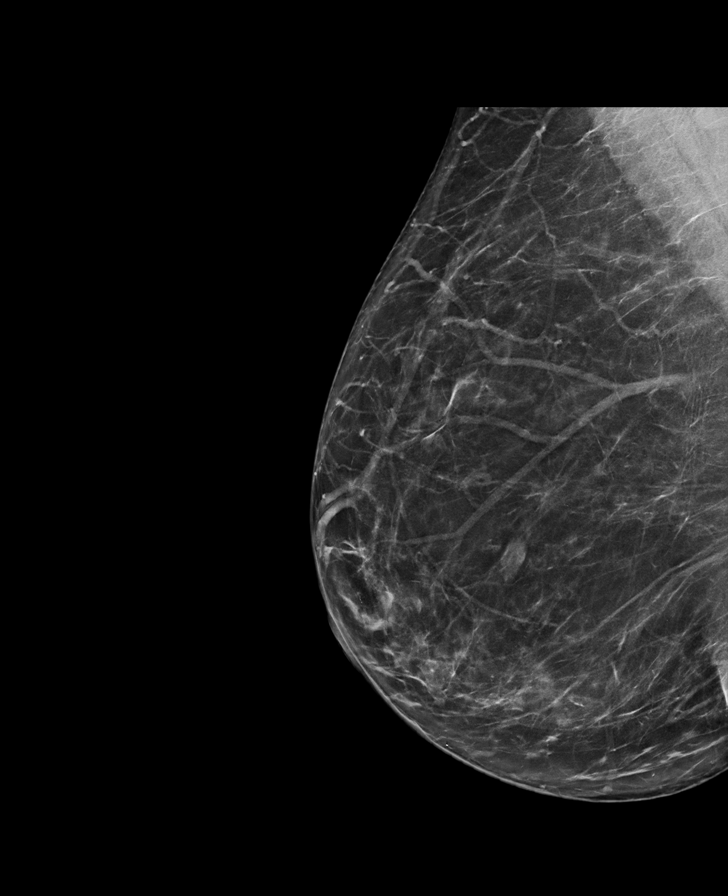

[R CC synth-2D]
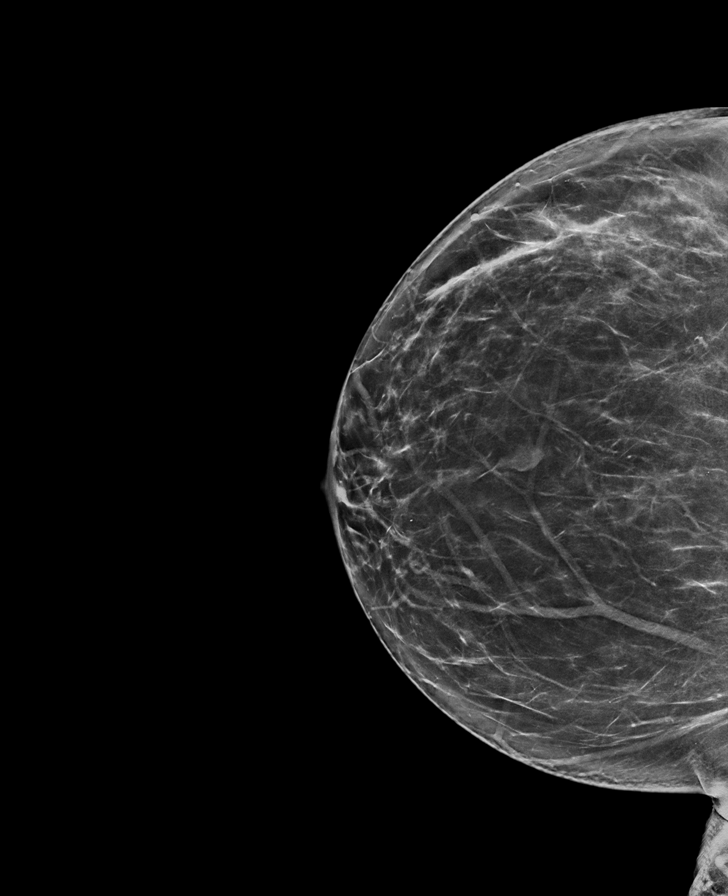

[L CC tomo · tomo slice 40/79.0]
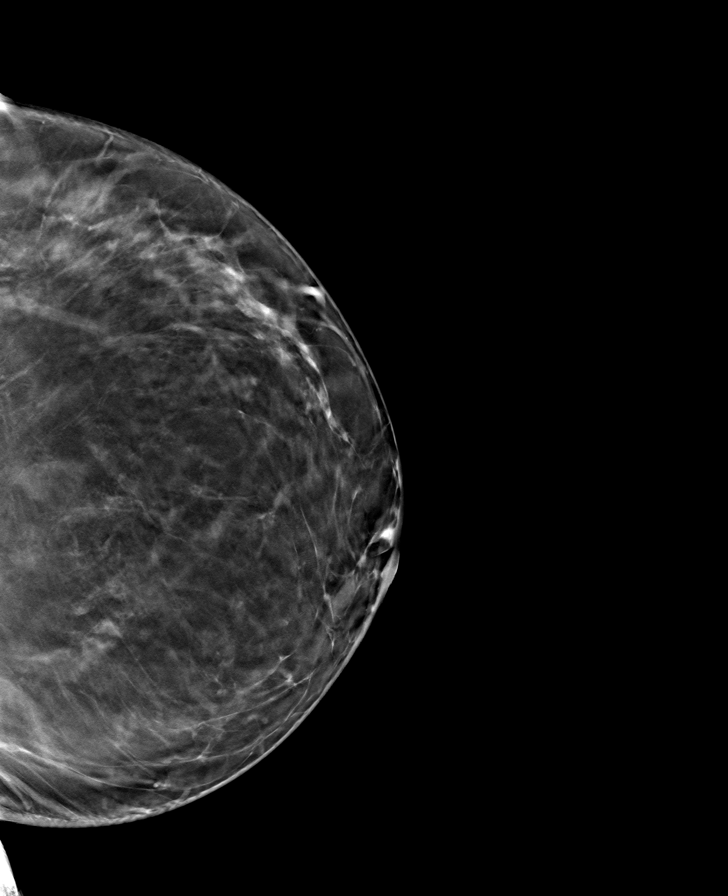

[R CC tomo · tomo slice 39/76.0]
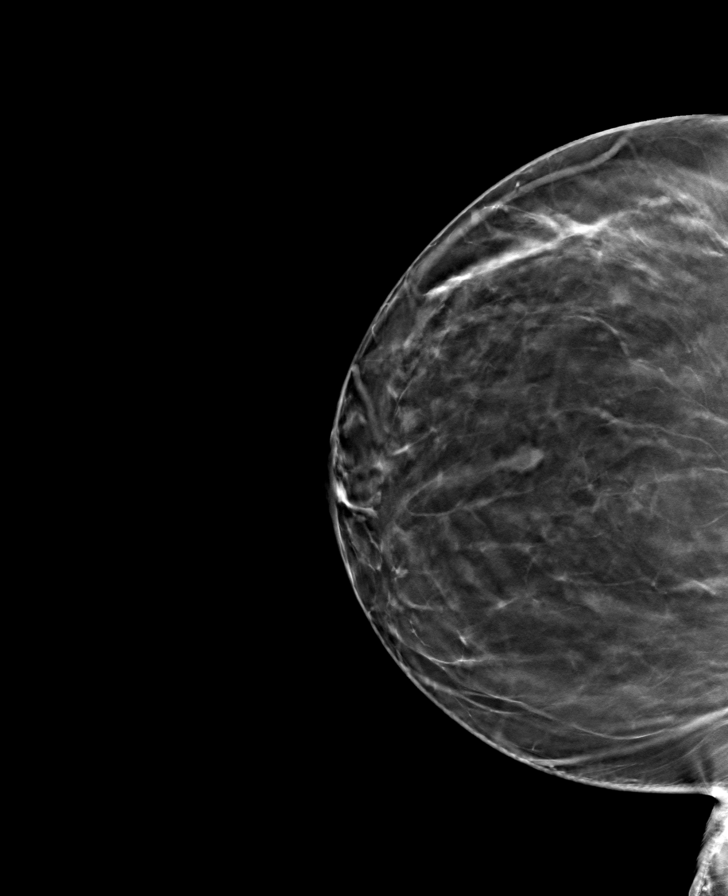

[R MLO tomo · tomo slice 41/80.0]
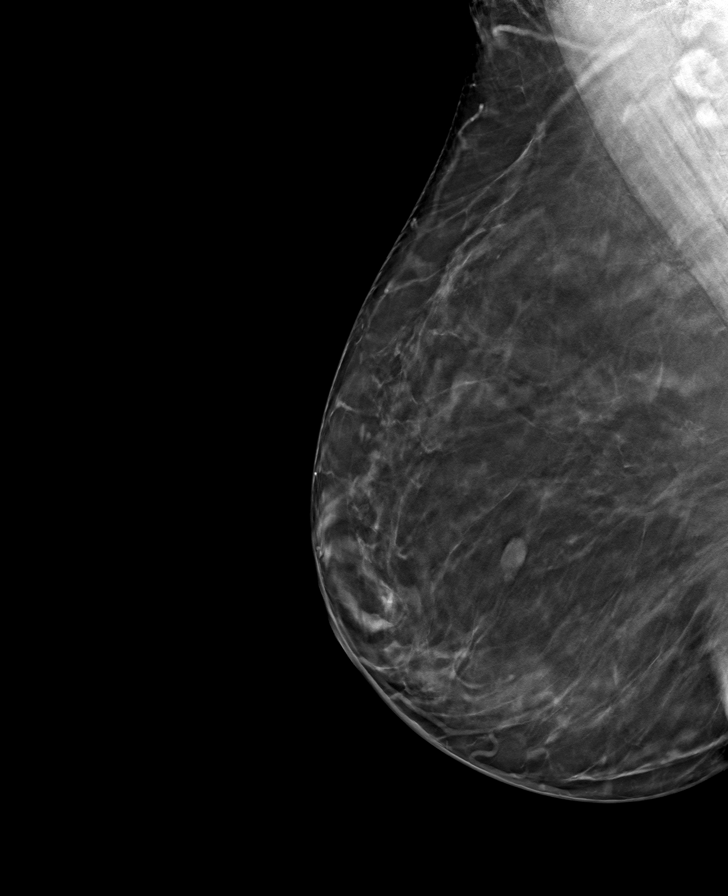

[L MLO tomo · tomo slice 39/76.0]
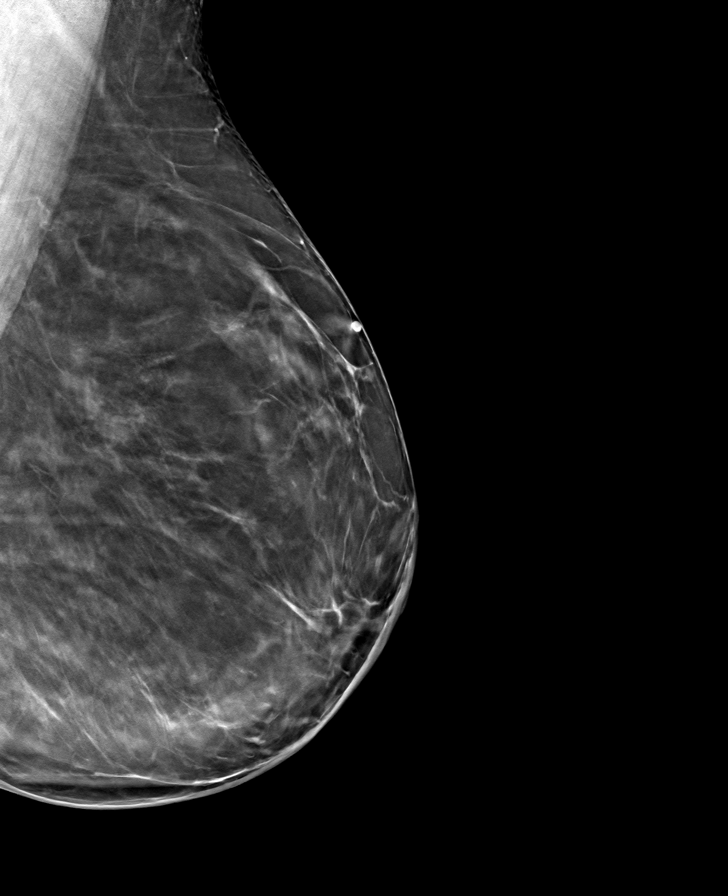

[8 of 24 positions shown; findings below may reference images not displayed]

ACR Breast Density Category b: There are scattered areas of
fibroglandular density.
FINDINGS: There are no findings suspicious for malignancy. Images were
processed with CAD.
IMPRESSION: No mammographic evidence of malignancy. A result letter of this
screening mammogram will be mailed directly to the patient.

RECOMMENDATION:
Screening mammogram in one year. (Code:[id number]-U-MUJ)

BI-RADS CATEGORY  2: Benign.

## 2019-06-03 ENCOUNTER — Encounter: Payer: Self-pay | Admitting: Internal Medicine

## 2019-06-03 DIAGNOSIS — F419 Anxiety disorder, unspecified: Secondary | ICD-10-CM

## 2019-06-03 MED ORDER — ALPRAZOLAM 0.25 MG PO TABS: 0.2500 mg | ORAL_TABLET | Freq: Every day | ORAL | 2 refills | Status: DC | PRN

## 2019-06-03 MED FILL — ALPRAZolam 0.25 MG TABS: 0.25 | 30 days supply | Qty: 30 | Fill #0

## 2019-06-04 ENCOUNTER — Encounter: Payer: Self-pay | Admitting: Internal Medicine

## 2019-06-04 DIAGNOSIS — G4700 Insomnia, unspecified: Secondary | ICD-10-CM

## 2019-06-06 ENCOUNTER — Other Ambulatory Visit: Payer: Self-pay | Admitting: Internal Medicine

## 2019-06-06 DIAGNOSIS — G4700 Insomnia, unspecified: Secondary | ICD-10-CM

## 2019-06-06 MED ORDER — ESZOPICLONE 2 MG PO TABS: 2.0000 mg | ORAL_TABLET | Freq: Every evening | ORAL | 5 refills | Status: DC | PRN

## 2019-06-06 MED FILL — ESZOPICLONE 2 MG TAB: 2 | 30 days supply | Qty: 30 | Fill #0

## 2019-06-09 MED FILL — NORETHINDRONE 0.35 MG TABS: 0.35 | 84 days supply | Qty: 84 | Fill #1

## 2019-06-23 MED FILL — CLIND PH-BENZOYL PEROX 1.2-: 1.2-5 | 20 days supply | Qty: 45 | Fill #0

## 2019-07-01 MED FILL — ALPRAZolam 0.25 MG TABS: 0.25 | 30 days supply | Qty: 30 | Fill #1

## 2019-07-28 ENCOUNTER — Other Ambulatory Visit: Payer: Self-pay | Admitting: Internal Medicine

## 2019-07-28 DIAGNOSIS — K219 Gastro-esophageal reflux disease without esophagitis: Secondary | ICD-10-CM

## 2019-07-28 MED FILL — PANTOPRAZOLE SOD DR 40 MG T: 40 | 90 days supply | Qty: 90 | Fill #0

## 2019-07-29 ENCOUNTER — Other Ambulatory Visit: Payer: Self-pay

## 2019-07-29 DIAGNOSIS — K219 Gastro-esophageal reflux disease without esophagitis: Secondary | ICD-10-CM

## 2019-08-07 DIAGNOSIS — H40019 Open angle with borderline findings, low risk, unspecified eye: Secondary | ICD-10-CM | POA: Diagnosis not present

## 2019-08-07 MED FILL — buPROPion HCL ER (XL) 150 M: 150 | 90 days supply | Qty: 90 | Fill #3

## 2019-08-07 MED FILL — SERTRALINE HCL 100 MG TAB: 100 | 90 days supply | Qty: 90 | Fill #3

## 2019-08-11 MED FILL — ALPRAZolam 0.25 MG TABS: 0.25 | 30 days supply | Qty: 30 | Fill #2

## 2019-08-11 MED FILL — ESZOPICLONE 2 MG TAB: 2 | 30 days supply | Qty: 30 | Fill #1

## 2019-08-14 ENCOUNTER — Ambulatory Visit: Payer: No Typology Code available for payment source | Admitting: Internal Medicine

## 2019-08-15 ENCOUNTER — Other Ambulatory Visit: Payer: Self-pay

## 2019-08-19 ENCOUNTER — Encounter: Payer: Self-pay | Admitting: Internal Medicine

## 2019-08-19 ENCOUNTER — Telehealth (INDEPENDENT_AMBULATORY_CARE_PROVIDER_SITE_OTHER): Payer: 59 | Admitting: Internal Medicine

## 2019-08-19 VITALS — Wt 173.0 lb

## 2019-08-19 DIAGNOSIS — Z780 Asymptomatic menopausal state: Secondary | ICD-10-CM

## 2019-08-19 DIAGNOSIS — Z0000 Encounter for general adult medical examination without abnormal findings: Secondary | ICD-10-CM | POA: Diagnosis not present

## 2019-08-19 DIAGNOSIS — J309 Allergic rhinitis, unspecified: Secondary | ICD-10-CM | POA: Diagnosis not present

## 2019-08-19 DIAGNOSIS — K219 Gastro-esophageal reflux disease without esophagitis: Secondary | ICD-10-CM | POA: Diagnosis not present

## 2019-08-19 DIAGNOSIS — G4700 Insomnia, unspecified: Secondary | ICD-10-CM | POA: Diagnosis not present

## 2019-08-19 DIAGNOSIS — Z1329 Encounter for screening for other suspected endocrine disorder: Secondary | ICD-10-CM

## 2019-08-19 DIAGNOSIS — Z13220 Encounter for screening for lipoid disorders: Secondary | ICD-10-CM | POA: Diagnosis not present

## 2019-08-19 DIAGNOSIS — Z1211 Encounter for screening for malignant neoplasm of colon: Secondary | ICD-10-CM | POA: Diagnosis not present

## 2019-08-19 DIAGNOSIS — F419 Anxiety disorder, unspecified: Secondary | ICD-10-CM

## 2019-08-19 DIAGNOSIS — E559 Vitamin D deficiency, unspecified: Secondary | ICD-10-CM

## 2019-08-19 DIAGNOSIS — Z1231 Encounter for screening mammogram for malignant neoplasm of breast: Secondary | ICD-10-CM

## 2019-08-19 DIAGNOSIS — Z1389 Encounter for screening for other disorder: Secondary | ICD-10-CM

## 2019-08-19 DIAGNOSIS — F339 Major depressive disorder, recurrent, unspecified: Secondary | ICD-10-CM

## 2019-08-19 MED ORDER — ESZOPICLONE 2 MG PO TABS: 2.0000 mg | ORAL_TABLET | Freq: Every evening | ORAL | 5 refills | Status: DC | PRN

## 2019-08-19 MED ORDER — BUPROPION HCL ER (XL) 150 MG PO TB24: 150.0000 mg | ORAL_TABLET | Freq: Every day | ORAL | 3 refills | Status: DC

## 2019-08-19 MED ORDER — SERTRALINE HCL 100 MG PO TABS: 100.0000 mg | ORAL_TABLET | Freq: Every day | ORAL | 3 refills | Status: DC

## 2019-08-19 MED ORDER — CETIRIZINE HCL 10 MG PO TBDP: 1.0000 | ORAL_TABLET | Freq: Every evening | ORAL | 3 refills | Status: DC | PRN

## 2019-08-19 MED ORDER — ALPRAZOLAM 0.25 MG PO TABS: 0.2500 mg | ORAL_TABLET | Freq: Every day | ORAL | 5 refills | Status: DC | PRN

## 2019-08-19 MED ORDER — PANTOPRAZOLE SODIUM 40 MG PO TBEC: 40.0000 mg | DELAYED_RELEASE_TABLET | Freq: Every day | ORAL | 3 refills | Status: DC

## 2019-08-19 NOTE — Progress Notes (Signed)
telephone Note  I connected with Cathy Guerrero  on 08/19/19 at  4:30 PM EDT by telephone and verified that I am speaking with the correct person using two identifiers.  Location patient: home Location provider:work or home office Persons participating in the virtual visit: patient, provider  I discussed the limitations of evaluation and management by telemedicine and the availability of in person appointments. The patient expressed understanding and agreed to proceed.   HPI: F/u  1. Recurrent depression and anxiety and insomnia on xanax 0.25 1-2x per month, wellbutrin xl 150 mg qd, lunesta 2 mg 1-2 x per month with reduced frequency, zoloft 100 mg qd Requests refills of meds   Her niece who she cares for after her nieces parents died is thriving in school and doing well  2. Agreeable to do screening colonoscopy age 51 and ob/gyn appt due 11/2019 and mammo 11/2019  3. C/o weight gain but trying to lose and wonders if she is in menopause due to no cycle since ablation 18 years ago after ablation   ROS: See pertinent positives and negatives per HPI.  Past Medical History:  Diagnosis Date  . Anemia   . Anxiety   . Endometriosis   . Fibromyalgia   . Frequent headaches    Followed by Neurology - in [place] - stress  . GERD (gastroesophageal reflux disease)   . Hypertension 3 years ago  . Ovarian cyst   . Rheumatoid arthritis ([place]) From [facility]    Remission 2 years; saw Dr. Wadie Lessen  was on MTX, Humira, Prednisone; normal ESR 8, anti CCP neg, CRP neg, RF negative , ANA neg with h/o +ANA, anti DS DNA 08/2008  . [facility] spotted fever 5 years ago   +IgM serologies   . Sleep apnea    RESOLVED SINCE GASTRIC BYPASS SURGERY IN 2015  . SVT (supraventricular tachycardia) ([place]) Noticed 3 years ago   adenosine sensitive short PR SVT docymented by EKG 4/17  . Syncope and collapse 3 years ago - Happened about 2 times - Last time    Seen by neuro and cardiology - increased heart rate  related    Past Surgical History:  Procedure Laterality Date  . APPENDECTOMY  1984  . CARDIAC ELECTROPHYSIOLOGY MAPPING AND ABLATION N/A 06/05/2017  . CHOLECYSTECTOMY N/A 09/01/2014   Procedure: LAPAROSCOPIC CHOLECYSTECTOMY;  Surgeon: Bonner Puna, MD;  Location: [facility] ORS;  Service: General;  Laterality: N/A;  . ENDOMETRIAL ABLATION  2010? Pt unsure  . LAPAROSCOPIC GASTRIC SLEEVE RESECTION     longitudinal gastrectomy/lap restrictive 01/27/14 Dr. Kreg Shropshire  . MOUTH SURGERY    . REDUCTION MAMMAPLASTY Bilateral 2003  . SVT ABLATION N/A 06/05/2017   Procedure: SVT ABLATION;  Surgeon: Thompson Grayer, MD;  Location: [place] [facility];  Service: Cardiovascular;  Laterality: N/A;  . TUBAL LIGATION  2001    Family History  Problem Relation Age of Onset  . Heart disease Mother   . Heart attack Mother   . Stroke Mother   . Hypertension Mother   . Hyperlipidemia Mother   . Arthritis Mother        Rheumatoid arthritis  . Kidney disease Mother   . Depression Mother   . Diabetes Mother   . Heart disease Father   . Heart attack Father   . Hypertension Father   . Hyperlipidemia Father   . Kidney disease Father   . Diabetes Father   . COPD Father   . Heart attack Maternal Grandmother   .  Breast cancer Maternal Grandmother   . Arthritis Maternal Grandfather        rheumatoid arthritis  . Diabetes Maternal Grandfather   . Sudden Cardiac Death Sister        died 02-13-2019 age 99  . Ovarian cancer Sister        1/2 sister  . Uterine cancer Sister        1/2 sister    SOCIAL HX: married with kids and cares for niece    Current Outpatient Medications:  .  ALPRAZolam (XANAX) 0.25 MG tablet, Take 1 tablet (0.25 mg total) by mouth daily as needed for anxiety., Disp: 30 tablet, Rfl: 5 .  Biotin 5000 MCG CAPS, Take 5,000 mcg by mouth daily. , Disp: , Rfl:  .  buPROPion (WELLBUTRIN XL) 150 MG 24 hr tablet, Take 1 tablet (150 mg total) by mouth daily., Disp: 90 tablet, Rfl: 3 .  CALCIUM  CARBONATE-VITAMIN D PO, Take 1 tablet by mouth daily., Disp: , Rfl:  .  Cetirizine HCl 10 MG TBDP, Take 1 tablet by mouth at bedtime as needed., Disp: 90 tablet, Rfl: 3 .  Clindamycin-Benzoyl Per, Refr, gel, Apply 1 application topically 4 (four) times a week., Disp: , Rfl: 8 .  eszopiclone (LUNESTA) 2 MG TABS tablet, Take 1 tablet (2 mg total) by mouth at bedtime as needed for sleep. Take immediately before bedtime, Disp: 30 tablet, Rfl: 5 .  Magnesium 250 MG TABS, Take 250 mg by mouth daily. , Disp: , Rfl:  .  norethindrone (MICRONOR,CAMILA,ERRIN) 0.35 MG tablet, Take 1 tablet by mouth daily. , Disp: , Rfl:  .  pantoprazole (PROTONIX) 40 MG tablet, Take 1 tablet (40 mg total) by mouth daily before supper. 30 min before meal, Disp: 90 tablet, Rfl: 3 .  sertraline (ZOLOFT) 100 MG tablet, Take 1 tablet (100 mg total) by mouth daily., Disp: 90 tablet, Rfl: 3 .  tretinoin (RETIN-A) 0.05 % cream, Apply 1 application topically daily as needed (for face)., Disp: , Rfl:   EXAM:  VITALS per patient if applicable:  GENERAL: alert, oriented, appears well and in no acute distress  PSYCH/NEURO: pleasant and cooperative, no obvious depression or anxiety, speech and thought processing grossly intact  ASSESSMENT AND PLAN:  Discussed the following assessment and plan:  Depression, recurrent (HCC)stable and controlled - Plan: buPROPion (WELLBUTRIN XL) 150 MG 24 hr tablet, sertraline (ZOLOFT) 100 MG tablet  Anxiety controlled - Plan: ALPRAZolam (XANAX) 0.25 MG tablet  Insomnia, controlled - Plan: eszopiclone (LUNESTA) 2 MG TABS tablet prn qhs  Allergic rhinitis, unspecified seasonality, unspecified trigger - Plan: Cetirizine HCl 10 MG TBDP  Gastroesophageal reflux disease - Plan: pantoprazole (PROTONIX) 40 MG tablet this is helping  HM-CPE at f/u after 10/10/19 Had flu shot 11/2018 covid shot declines   Tdaputd Declines HPV vx  Hep B immune MMR immune  Pap had 02/23/16 neg no h/o abnormal pap  except 1991 possibly related to OCP use but resolved per pt due for repeat in 02/23/2019 -saw [facility] ob/gyn 10/01/18 for endometriosis Dr. Laddie Aquas refilled norethindrone 0.35 qd for endometrosisno pap done pelvic performed174 wt doing some cardio as of 10/10/2018 As of 08/19/19 no cycles due to ablation x 18 years  Pap due 11/2019 will call ob/gyn to schedule   Mammo 11/29/18 negativeordered GI breast center   Colonoscopy consider age 69 and up Dr. Henrene Pastor referred   Obtained notes from [facility] rheumatology and arthritis center (Dr. Larey Seat f/u rheumatology [place]/Dr. Sherilyn Dacosta dermatology  near [place] in [place] in 2019as of 08/19/19 no need for dermatology  Cont healthy diet and exercise  -we discussed possible serious and likely etiologies, options for evaluation and workup, limitations of telemedicine visit vs in person visit, treatment, treatment risks and precautions. Pt prefers to treat via telemedicine empirically rather then risking or undertaking an in person visit at this moment. Patient agrees to seek prompt in person care if worsening, new symptoms arise, or if is not improving with treatment.   I discussed the assessment and treatment plan with the patient. The patient was provided an opportunity to ask questions and all were answered. The patient agreed with the plan and demonstrated an understanding of the instructions.   The patient was advised to call back or seek an in-person evaluation if the symptoms worsen or if the condition fails to improve as anticipated.  Time spent 20 minutes  Delorise Jackson, MD

## 2019-09-03 ENCOUNTER — Encounter: Payer: Self-pay | Admitting: Internal Medicine

## 2019-09-03 ENCOUNTER — Telehealth: Payer: Self-pay | Admitting: Internal Medicine

## 2019-09-03 MED FILL — NORETHINDRONE 0.35 MG TABS: 0.35 | 84 days supply | Qty: 84 | Fill #2

## 2019-09-03 NOTE — Telephone Encounter (Signed)
Should be able to have both procedures scheduled together.

## 2019-09-03 NOTE — Telephone Encounter (Signed)
Pt called wanting to scheduled ENDO/COLON   She has a recall for and EGD and a referral for colonoscopy . Is it okay to scheduled both? Please advise

## 2019-09-18 ENCOUNTER — Encounter: Payer: Self-pay | Admitting: Internal Medicine

## 2019-09-22 ENCOUNTER — Other Ambulatory Visit: Payer: Self-pay | Admitting: Internal Medicine

## 2019-09-22 DIAGNOSIS — F419 Anxiety disorder, unspecified: Secondary | ICD-10-CM

## 2019-09-22 MED ORDER — ALPRAZOLAM 0.25 MG PO TABS: 0.2500 mg | ORAL_TABLET | Freq: Every day | ORAL | 5 refills | Status: DC | PRN

## 2019-09-22 MED FILL — ALPRAZolam 0.25 MG TABS: 0.25 | 30 days supply | Qty: 30 | Fill #0

## 2019-10-07 ENCOUNTER — Encounter: Payer: Self-pay | Admitting: Internal Medicine

## 2019-10-08 ENCOUNTER — Other Ambulatory Visit (HOSPITAL_COMMUNITY)
Admission: RE | Admit: 2019-10-08 | Discharge: 2019-10-08 | Disposition: A | Discharge status: Home / Self Care | Payer: 59 | Source: Ambulatory Visit | Attending: Internal Medicine | Admitting: Internal Medicine

## 2019-10-08 DIAGNOSIS — Z1389 Encounter for screening for other disorder: Secondary | ICD-10-CM | POA: Diagnosis not present

## 2019-10-08 DIAGNOSIS — Z1329 Encounter for screening for other suspected endocrine disorder: Secondary | ICD-10-CM | POA: Insufficient documentation

## 2019-10-08 LAB — CBC WITH DIFFERENTIAL/PLATELET
Abs Immature Granulocytes: 0.02 10*3/uL (ref 0.00–0.07)
Basophils Absolute: 0 10*3/uL (ref 0.0–0.1)
Basophils Relative: 1 %
Eosinophils Absolute: 0.1 10*3/uL (ref 0.0–0.5)
Eosinophils Relative: 1 %
HCT: 40.7 % (ref 36.0–46.0)
Hemoglobin: 13.2 g/dL (ref 12.0–15.0)
Immature Granulocytes: 0 %
Lymphocytes Relative: 37 %
Lymphs Abs: 2.9 10*3/uL (ref 0.7–4.0)
MCH: 28 pg (ref 26.0–34.0)
MCHC: 32.4 g/dL (ref 30.0–36.0)
MCV: 86.4 fL (ref 80.0–100.0)
Monocytes Absolute: 0.4 10*3/uL (ref 0.1–1.0)
Monocytes Relative: 6 %
Neutro Abs: 4.3 10*3/uL (ref 1.7–7.7)
Neutrophils Relative %: 55 %
Platelets: 289 10*3/uL (ref 150–400)
RBC: 4.71 MIL/uL (ref 3.87–5.11)
RDW: 12.3 % (ref 11.5–15.5)
WBC: 7.9 10*3/uL (ref 4.0–10.5)
nRBC: 0 % (ref 0.0–0.2)

## 2019-10-08 LAB — URINALYSIS, ROUTINE W REFLEX MICROSCOPIC
Bilirubin Urine: NEGATIVE
Glucose, UA: NEGATIVE mg/dL
Hgb urine dipstick: NEGATIVE
Ketones, ur: NEGATIVE mg/dL
Leukocytes,Ua: NEGATIVE
Nitrite: NEGATIVE
Protein, ur: NEGATIVE mg/dL
Specific Gravity, Urine: 1.015 (ref 1.005–1.030)
pH: 8 (ref 5.0–8.0)

## 2019-10-08 LAB — COMPREHENSIVE METABOLIC PANEL
ALT: 17 U/L (ref 0–44)
AST: 16 U/L (ref 15–41)
Albumin: 4.1 g/dL (ref 3.5–5.0)
Alkaline Phosphatase: 58 U/L (ref 38–126)
Anion gap: 10 (ref 5–15)
BUN: 13 mg/dL (ref 6–20)
CO2: 27 mmol/L (ref 22–32)
Calcium: 9.4 mg/dL (ref 8.9–10.3)
Chloride: 102 mmol/L (ref 98–111)
Creatinine, Ser: 0.94 mg/dL (ref 0.44–1.00)
GFR calc Af Amer: >60 mL/min (ref >60)
GFR calc non Af Amer: >60 mL/min (ref >60)
Glucose, Bld: 97 mg/dL (ref 70–99)
Potassium: 4.1 mmol/L (ref 3.5–5.1)
Sodium: 139 mmol/L (ref 135–145)
Total Bilirubin: 0.8 mg/dL (ref 0.3–1.2)
Total Protein: 6.9 g/dL (ref 6.5–8.1)

## 2019-10-08 LAB — LIPID PANEL
Cholesterol: 143 mg/dL (ref 0–200)
HDL: 55 mg/dL (ref >40)
LDL Cholesterol: 73 mg/dL (ref 0–99)
Total CHOL/HDL Ratio: 2.6 RATIO
Triglycerides: 76 mg/dL (ref <150)
VLDL: 15 mg/dL (ref 0–40)

## 2019-10-08 LAB — TSH: TSH: 1.775 u[IU]/mL (ref 0.350–4.500)

## 2019-10-08 LAB — T4, FREE: Free T4: 1.06 ng/dL (ref 0.61–1.12)

## 2019-10-10 LAB — FOLLICLE STIMULATING HORMONE: FSH: 10 m[IU]/mL

## 2019-10-16 LAB — 25-HYDROXY VITAMIN D LCMS D2+D3
25-Hydroxy, Vitamin D-2: <1 ng/mL
25-Hydroxy, Vitamin D-3: 105 ng/mL
25-Hydroxy, Vitamin D: 105 ng/mL — ABNORMAL HIGH

## 2019-10-17 ENCOUNTER — Encounter: Payer: 59 | Admitting: Internal Medicine

## 2019-10-27 ENCOUNTER — Encounter: Payer: Self-pay | Admitting: Internal Medicine

## 2019-10-28 MED FILL — SERTRALINE HCL 100 MG TABS: 100 | 90 days supply | Qty: 90 | Fill #0

## 2019-10-28 MED FILL — buPROPion HCL ER (XL) 150 M: 150 | 90 days supply | Qty: 90 | Fill #0

## 2019-10-28 MED FILL — PANTOPRAZOLE SOD DR 40 MG T: 40 | 90 days supply | Qty: 90 | Fill #0

## 2019-11-03 MED FILL — ALPRAZolam 0.25 MG TABS: 0.25 | 30 days supply | Qty: 30 | Fill #1

## 2019-11-03 MED FILL — ESZOPICLONE 2 MG TAB: 2 | 30 days supply | Qty: 30 | Fill #2

## 2019-11-24 MED FILL — NORETHINDRONE 0.35 MG TABS: 0.35 | 84 days supply | Qty: 84 | Fill #3

## 2019-12-01 ENCOUNTER — Ambulatory Visit: Payer: 59

## 2019-12-01 MED FILL — ALPRAZolam 0.25 MG TABS: 0.25 | 30 days supply | Qty: 30 | Fill #2

## 2019-12-24 ENCOUNTER — Other Ambulatory Visit: Payer: Self-pay

## 2019-12-24 ENCOUNTER — Ambulatory Visit
Admission: RE | Admit: 2019-12-24 | Discharge: 2019-12-24 | Disposition: A | Discharge status: Home / Self Care | Payer: 59 | Source: Ambulatory Visit | Attending: Internal Medicine | Admitting: Internal Medicine

## 2019-12-24 DIAGNOSIS — Z1231 Encounter for screening mammogram for malignant neoplasm of breast: Secondary | ICD-10-CM

## 2020-01-27 ENCOUNTER — Telehealth: Payer: Self-pay | Admitting: Internal Medicine

## 2020-01-27 DIAGNOSIS — G4700 Insomnia, unspecified: Secondary | ICD-10-CM

## 2020-01-27 DIAGNOSIS — F419 Anxiety disorder, unspecified: Secondary | ICD-10-CM

## 2020-01-27 NOTE — Telephone Encounter (Signed)
Patient called in stated that her insurance changed  And she need a new prescription [facility] and she need 90 day supply [phone]   [facility] [id number] Group # [id number]

## 2020-01-28 NOTE — Telephone Encounter (Signed)
Please advise   Patient last seen 08/2019 virtually has not been seen in person since 2020.

## 2020-02-02 ENCOUNTER — Other Ambulatory Visit: Payer: Self-pay | Admitting: Internal Medicine

## 2020-02-02 DIAGNOSIS — J309 Allergic rhinitis, unspecified: Secondary | ICD-10-CM

## 2020-02-02 DIAGNOSIS — K219 Gastro-esophageal reflux disease without esophagitis: Secondary | ICD-10-CM

## 2020-02-02 DIAGNOSIS — F339 Major depressive disorder, recurrent, unspecified: Secondary | ICD-10-CM

## 2020-02-02 MED ORDER — SERTRALINE HCL 100 MG PO TABS: 100.0000 mg | ORAL_TABLET | Freq: Every day | ORAL | 3 refills | Status: DC

## 2020-02-02 MED ORDER — BUPROPION HCL ER (XL) 150 MG PO TB24: 150.0000 mg | ORAL_TABLET | Freq: Every day | ORAL | 3 refills | Status: DC

## 2020-02-02 MED ORDER — PANTOPRAZOLE SODIUM 40 MG PO TBEC: 40.0000 mg | DELAYED_RELEASE_TABLET | Freq: Every day | ORAL | 3 refills | Status: DC

## 2020-02-02 MED ORDER — CETIRIZINE HCL 10 MG PO TBDP: 1.0000 | ORAL_TABLET | Freq: Every evening | ORAL | 3 refills | Status: AC | PRN

## 2020-02-02 MED ORDER — CETIRIZINE HCL 10 MG PO TBDP: 1.0000 | ORAL_TABLET | Freq: Every evening | ORAL | 3 refills | Status: DC | PRN

## 2020-02-02 NOTE — Telephone Encounter (Signed)
Controlled lunesta and xanax will still need to go to local pharmacy which will be her local pharmacy ? Others sent [facility]  Sch in person visit when able before 1 year up since last virutal ?  Thanks [place]

## 2020-02-03 NOTE — Telephone Encounter (Signed)
Pt returned your call.  

## 2020-02-03 NOTE — Telephone Encounter (Signed)
Left message to return call 

## 2020-02-10 NOTE — Telephone Encounter (Signed)
Patient scheduled for in person 03/25/20.  If Patient is needing labs she would like them sent to [facility] and a mychart message to let her know when she need to go.   Patient's local pharmacy in [address] in [place]. This has been updated in Patient's chart.

## 2020-02-12 ENCOUNTER — Encounter: Payer: Self-pay | Admitting: Internal Medicine

## 2020-02-12 ENCOUNTER — Other Ambulatory Visit: Payer: Self-pay | Admitting: Internal Medicine

## 2020-02-12 MED ORDER — ALPRAZOLAM 0.25 MG PO TABS: 0.2500 mg | ORAL_TABLET | Freq: Every day | ORAL | 5 refills | Status: DC | PRN

## 2020-02-12 MED ORDER — ESZOPICLONE 2 MG PO TABS: 2.0000 mg | ORAL_TABLET | Freq: Every evening | ORAL | 5 refills | Status: DC | PRN

## 2020-02-27 ENCOUNTER — Other Ambulatory Visit: Payer: Self-pay | Admitting: Internal Medicine

## 2020-02-27 ENCOUNTER — Encounter: Payer: Self-pay | Admitting: Internal Medicine

## 2020-02-27 DIAGNOSIS — Z1329 Encounter for screening for other suspected endocrine disorder: Secondary | ICD-10-CM

## 2020-02-27 DIAGNOSIS — E559 Vitamin D deficiency, unspecified: Secondary | ICD-10-CM

## 2020-02-27 DIAGNOSIS — Z13220 Encounter for screening for lipoid disorders: Secondary | ICD-10-CM

## 2020-02-27 DIAGNOSIS — Z0000 Encounter for general adult medical examination without abnormal findings: Secondary | ICD-10-CM

## 2020-02-27 DIAGNOSIS — Z1389 Encounter for screening for other disorder: Secondary | ICD-10-CM

## 2020-03-25 ENCOUNTER — Ambulatory Visit: Payer: BC Managed Care – PPO | Admitting: Internal Medicine

## 2020-03-26 ENCOUNTER — Telehealth: Payer: Self-pay | Admitting: Internal Medicine

## 2020-03-26 ENCOUNTER — Ambulatory Visit: Payer: BC Managed Care – PPO | Admitting: Internal Medicine

## 2020-03-26 NOTE — Telephone Encounter (Signed)
Patient no-showed today's appointment; appointment was for 1/14 at 3:00 pm, provider notified for review of record.  Letter sent for patient to call in and re-schedule.

## 2020-05-03 ENCOUNTER — Telehealth: Payer: Self-pay | Admitting: Internal Medicine

## 2020-05-03 NOTE — Telephone Encounter (Signed)
Noted  For your information   

## 2020-05-03 NOTE — Telephone Encounter (Signed)
Pt called she was bite by a dog over the weekend   No appts available. Pt stated that she would go to [facility]

## 2020-06-28 ENCOUNTER — Ambulatory Visit (INDEPENDENT_AMBULATORY_CARE_PROVIDER_SITE_OTHER): Payer: BC Managed Care – PPO | Admitting: Internal Medicine

## 2020-06-28 ENCOUNTER — Encounter: Payer: Self-pay | Admitting: Internal Medicine

## 2020-06-28 VITALS — BP 114/86 | HR 93 | Ht 63.0 in | Wt 188.0 lb

## 2020-06-28 DIAGNOSIS — Z9884 Bariatric surgery status: Secondary | ICD-10-CM

## 2020-06-28 DIAGNOSIS — K3189 Other diseases of stomach and duodenum: Secondary | ICD-10-CM

## 2020-06-28 DIAGNOSIS — Z1211 Encounter for screening for malignant neoplasm of colon: Secondary | ICD-10-CM

## 2020-06-28 DIAGNOSIS — K219 Gastro-esophageal reflux disease without esophagitis: Secondary | ICD-10-CM | POA: Diagnosis not present

## 2020-06-28 MED ORDER — PLENVU 140 G PO SOLR
1.0000 | Freq: Once | ORAL | 0 refills | Status: AC
Start: 2020-06-28 — End: 2020-06-28

## 2020-06-28 NOTE — Patient Instructions (Signed)
If you are age 46 or older, your body mass index should be between 23-30. Your Body mass index is 33.3 kg/m. If this is out of the aforementioned range listed, please consider follow up with your Primary Care Provider.  If you are age 69 or younger, your body mass index should be between 19-25. Your Body mass index is 33.3 kg/m. If this is out of the aformentioned range listed, please consider follow up with your Primary Care Provider.   You have been scheduled for an endoscopy and colonoscopy. Please follow the written instructions given to you at your visit today. Please pick up your prep supplies at the pharmacy within the next 1-3 days. If you use inhalers (even only as needed), please bring them with you on the day of your procedure.

## 2020-06-28 NOTE — Progress Notes (Signed)
HISTORY OF PRESENT ILLNESS:  Cathy Guerrero is a 45 y.o. female status post gastric sleeve procedure and chronic GERD.  She presents today regarding chronic GERD and the need for follow-up upper endoscopy and index screening colonoscopy.  Patient does have a history of GERD.  She did undergo anoscopy July 09, 2017.  She was found to have evidence of prior surgery.  The esophagus was normal.  Prepyloric and submucosal gastric lesions were biopsied and returned benign.  She tells me that she did come off her PPI with concerns over possible drug related effects on memory.  Off medication she experienced significant breakthrough reflux symptoms.  She was taking famotidine which was less helpful.  No dysphagia.  He does have problems with epigastric discomfort.  She is status postcholecystectomy.  No prior colonoscopy.  No family history of colon cancer.  She does report that her bowel habits may alternate between constipation and diarrhea.  No bleeding.  CT scan March 2019 to evaluate abdominal pain is unremarkable.  She has completed her COVID vaccination series  REVIEW OF SYSTEMS:  All non-GI ROS negative unless otherwise stated in the HPI except for arthritis, anxiety, depression, muscle cramps, sinus and allergy  Past Medical History:  Diagnosis Date  . Anemia   . Anxiety   . Endometriosis   . Fibromyalgia   . Frequent headaches    Followed by Neurology - in [place] - stress  . GERD (gastroesophageal reflux disease)   . Hypertension 3 years ago  . Ovarian cyst   . Rheumatoid arthritis ([place]) From [facility]    Remission 2 years; saw Dr. Wadie Lessen Taylor was on MTX, Humira, Prednisone; normal ESR 8, anti CCP neg, CRP neg, RF negative , ANA neg with h/o +ANA, anti DS DNA 08/2008  . [facility] spotted fever 5 years ago   +IgM serologies   . Sleep apnea    RESOLVED SINCE GASTRIC BYPASS SURGERY IN 2015  . SVT (supraventricular tachycardia) ([place]) Noticed 3 years ago   adenosine sensitive short  PR SVT docymented by EKG 4/17  . Syncope and collapse 3 years ago - Happened about 2 times - Last time    Seen by neuro and cardiology - increased heart rate related    Past Surgical History:  Procedure Laterality Date  . APPENDECTOMY  1984  . CARDIAC ELECTROPHYSIOLOGY MAPPING AND ABLATION N/A 06/05/2017  . CHOLECYSTECTOMY N/A 09/01/2014   Procedure: LAPAROSCOPIC CHOLECYSTECTOMY;  Surgeon: Bonner Puna, MD;  Location: [facility] ORS;  Service: General;  Laterality: N/A;  . ENDOMETRIAL ABLATION  2010? Pt unsure  . LAPAROSCOPIC GASTRIC SLEEVE RESECTION     longitudinal gastrectomy/lap restrictive 01/27/14 Dr. Kreg Shropshire  . MOUTH SURGERY    . REDUCTION MAMMAPLASTY Bilateral 2003  . SVT ABLATION N/A 06/05/2017   Procedure: SVT ABLATION;  Surgeon: Thompson Grayer, MD;  Location: [facility];  Service: Cardiovascular;  Laterality: N/A;  . TUBAL LIGATION  2001    Social History Cathy Guerrero  reports that she has never smoked. She has never used smokeless tobacco. She reports that she does not drink alcohol and does not use drugs.  family history includes Arthritis in her maternal grandfather and mother; Breast cancer in her maternal grandmother; COPD in her father; Depression in her mother; Diabetes in her father, maternal grandfather, and mother; Heart attack in her father, maternal grandmother, and mother; Heart disease in her father and mother; Hyperlipidemia in her father and mother; Hypertension in her father and  mother; Kidney disease in her father and mother; Ovarian cancer in her sister; Stroke in her mother; Sudden Cardiac Death in her sister; Uterine cancer in her sister.  Allergies  Allergen Reactions  . Amoxicillin Hives  . Enbrel [Etanercept] Hives and Rash       PHYSICAL EXAMINATION: Vital signs: BP 114/86   Pulse 93   Ht '5\' 3"'  (1.6 m)   Wt 188 lb (85.3 kg)   SpO2 99%   BMI 33.30 kg/m   Constitutional: generally well-appearing, no acute distress Psychiatric: alert and  oriented x3, cooperative Eyes: extraocular movements intact, anicteric, conjunctiva pink Mouth: oral pharynx moist, no lesions Neck: supple no lymphadenopathy Cardiovascular: heart regular rate and rhythm, no murmur Lungs: clear to auscultation bilaterally Abdomen: soft, nontender, nondistended, no obvious ascites, no peritoneal signs, normal bowel sounds, no organomegaly Rectal: Deferred until colonoscopy Extremities: no clubbing, cyanosis, or lower extremity edema bilaterally Skin: no lesions on visible extremities Neuro: No focal deficits. No asterixis.    ASSESSMENT:  1.  Chronic GERD.  Worsening symptoms off PPI.  H2 receptor antagonist therapy less effective. 2.  Epigastric discomfort.  Suspect related to GERD 3.  Status post gastric sleeve procedure 4.  Submucosal gastric lesions with negative biopsies in 2019. 5.  Colon cancer screening.  Baseline risk   PLAN:  1.  Reflux precautions 2.  Resume pantoprazole 40 mg daily.  Medication risks reviewed 3.  Schedule upper endoscopy to evaluate epigastric pain and GERD.The nature of the procedure, as well as the risks, benefits, and alternatives were carefully and thoroughly reviewed with the patient. Ample time for discussion and questions allowed. The patient understood, was satisfied, and agreed to proceed. 4.  Schedule colonoscopy for routine colon cancer screening.The nature of the procedure, as well as the risks, benefits, and alternatives were carefully and thoroughly reviewed with the patient. Ample time for discussion and questions allowed. The patient understood, was satisfied, and agreed to proceed.

## 2020-09-07 ENCOUNTER — Other Ambulatory Visit: Payer: Self-pay | Admitting: Internal Medicine

## 2020-09-07 DIAGNOSIS — F419 Anxiety disorder, unspecified: Secondary | ICD-10-CM

## 2020-09-08 ENCOUNTER — Other Ambulatory Visit: Payer: Self-pay | Admitting: Internal Medicine

## 2020-09-08 DIAGNOSIS — F419 Anxiety disorder, unspecified: Secondary | ICD-10-CM

## 2020-09-08 DIAGNOSIS — G4700 Insomnia, unspecified: Secondary | ICD-10-CM

## 2020-09-08 MED ORDER — ESZOPICLONE 2 MG PO TABS: 2.0000 mg | ORAL_TABLET | Freq: Every evening | ORAL | 5 refills | Status: DC | PRN

## 2020-09-08 MED ORDER — ALPRAZOLAM 0.25 MG PO TABS: 0.2500 mg | ORAL_TABLET | Freq: Every day | ORAL | 5 refills | Status: DC | PRN

## 2020-09-08 NOTE — Telephone Encounter (Signed)
PT called to return the missed call 

## 2020-09-08 NOTE — Telephone Encounter (Signed)
Called and left a message to call back. Patient needs an appointment scheduled with PCP for a med refill.

## 2020-09-08 NOTE — Telephone Encounter (Signed)
Returned the call to the patient. Cathy Guerrero scheduled to be seen by Dr. Olivia Mackie on 09/21/20 at 3pm.

## 2020-09-20 ENCOUNTER — Telehealth: Payer: Self-pay

## 2020-09-20 ENCOUNTER — Other Ambulatory Visit (HOSPITAL_COMMUNITY)
Admission: RE | Admit: 2020-09-20 | Discharge: 2020-09-20 | Disposition: A | Discharge status: Home / Self Care | Payer: BC Managed Care – PPO | Source: Ambulatory Visit | Attending: Internal Medicine | Admitting: Internal Medicine

## 2020-09-20 DIAGNOSIS — E559 Vitamin D deficiency, unspecified: Secondary | ICD-10-CM

## 2020-09-20 DIAGNOSIS — Z1329 Encounter for screening for other suspected endocrine disorder: Secondary | ICD-10-CM

## 2020-09-20 DIAGNOSIS — Z0000 Encounter for general adult medical examination without abnormal findings: Secondary | ICD-10-CM

## 2020-09-20 DIAGNOSIS — Z1389 Encounter for screening for other disorder: Secondary | ICD-10-CM

## 2020-09-20 NOTE — Telephone Encounter (Signed)
PT called to advise that the lab have not received anything yet and she needs it sent over asap or she is going to cancel her labs for tomorrow. As she cant go all day without eating. PT would like a call in regards to what is going on and once its been faxed over.

## 2020-09-20 NOTE — Telephone Encounter (Signed)
PT called back to advise that it also needs to be released to [facility].

## 2020-09-20 NOTE — Telephone Encounter (Signed)
FYI-  Orders have been confirmed faxed to number provided. Called the patient to confirm if they have received the orders. Cathy Guerrero states that the orders have not been received and she rescheduled her yearly appointment with Dr. Olivia Mackie to 09/30/20.

## 2020-09-20 NOTE — Telephone Encounter (Signed)
Noted  

## 2020-09-20 NOTE — Telephone Encounter (Signed)
Pt called in stating that [facility] does not see the labs ordered in 02/27/2020. Yarelis gave a fax number to fax if needed. It is [phone]. Labs were reordered to the hospital for lab collect.

## 2020-09-20 NOTE — Telephone Encounter (Signed)
Orders have been faxed to number provided by patient. Waiting on confirmation.

## 2020-09-21 ENCOUNTER — Ambulatory Visit: Payer: BC Managed Care – PPO | Admitting: Internal Medicine

## 2020-09-27 ENCOUNTER — Other Ambulatory Visit (HOSPITAL_COMMUNITY)
Admission: AD | Admit: 2020-09-27 | Discharge: 2020-09-27 | Disposition: A | Discharge status: Home / Self Care | Payer: BC Managed Care – PPO | Source: Ambulatory Visit | Attending: Internal Medicine | Admitting: Internal Medicine

## 2020-09-27 DIAGNOSIS — E559 Vitamin D deficiency, unspecified: Secondary | ICD-10-CM | POA: Diagnosis not present

## 2020-09-27 DIAGNOSIS — Z1329 Encounter for screening for other suspected endocrine disorder: Secondary | ICD-10-CM | POA: Diagnosis not present

## 2020-09-27 DIAGNOSIS — Z0000 Encounter for general adult medical examination without abnormal findings: Secondary | ICD-10-CM | POA: Diagnosis not present

## 2020-09-27 LAB — COMPREHENSIVE METABOLIC PANEL
ALT: 17 U/L (ref 0–44)
AST: 19 U/L (ref 15–41)
Albumin: 3.6 g/dL (ref 3.5–5.0)
Alkaline Phosphatase: 54 U/L (ref 38–126)
Anion gap: 6 (ref 5–15)
BUN: 15 mg/dL (ref 6–20)
CO2: 27 mmol/L (ref 22–32)
Calcium: 8.8 mg/dL — ABNORMAL LOW (ref 8.9–10.3)
Chloride: 105 mmol/L (ref 98–111)
Creatinine, Ser: 0.98 mg/dL (ref 0.44–1.00)
GFR, Estimated: >60 mL/min (ref >60)
Glucose, Bld: 97 mg/dL (ref 70–99)
Potassium: 3.4 mmol/L — ABNORMAL LOW (ref 3.5–5.1)
Sodium: 138 mmol/L (ref 135–145)
Total Bilirubin: 0.7 mg/dL (ref 0.3–1.2)
Total Protein: 6.4 g/dL — ABNORMAL LOW (ref 6.5–8.1)

## 2020-09-27 LAB — TSH: TSH: 2.538 u[IU]/mL (ref 0.350–4.500)

## 2020-09-27 LAB — CBC WITH DIFFERENTIAL/PLATELET
Abs Immature Granulocytes: 0.01 10*3/uL (ref 0.00–0.07)
Basophils Absolute: 0 10*3/uL (ref 0.0–0.1)
Basophils Relative: 1 %
Eosinophils Absolute: 0.1 10*3/uL (ref 0.0–0.5)
Eosinophils Relative: 2 %
HCT: 37.9 % (ref 36.0–46.0)
Hemoglobin: 12.9 g/dL (ref 12.0–15.0)
Immature Granulocytes: 0 %
Lymphocytes Relative: 41 %
Lymphs Abs: 2.5 10*3/uL (ref 0.7–4.0)
MCH: 29.1 pg (ref 26.0–34.0)
MCHC: 34 g/dL (ref 30.0–36.0)
MCV: 85.4 fL (ref 80.0–100.0)
Monocytes Absolute: 0.4 10*3/uL (ref 0.1–1.0)
Monocytes Relative: 6 %
Neutro Abs: 3.1 10*3/uL (ref 1.7–7.7)
Neutrophils Relative %: 50 %
Platelets: 268 10*3/uL (ref 150–400)
RBC: 4.44 MIL/uL (ref 3.87–5.11)
RDW: 12.5 % (ref 11.5–15.5)
WBC: 6.1 10*3/uL (ref 4.0–10.5)
nRBC: 0 % (ref 0.0–0.2)

## 2020-09-27 LAB — LIPID PANEL
Cholesterol: 135 mg/dL (ref 0–200)
HDL: 47 mg/dL (ref >40)
LDL Cholesterol: 71 mg/dL (ref 0–99)
Total CHOL/HDL Ratio: 2.9 RATIO
Triglycerides: 85 mg/dL (ref <150)
VLDL: 17 mg/dL (ref 0–40)

## 2020-09-27 LAB — VITAMIN D 25 HYDROXY (VIT D DEFICIENCY, FRACTURES): Vit D, 25-Hydroxy: 62.33 ng/mL (ref 30–100)

## 2020-09-29 ENCOUNTER — Ambulatory Visit (AMBULATORY_SURGERY_CENTER): Payer: BC Managed Care – PPO | Admitting: Internal Medicine

## 2020-09-29 ENCOUNTER — Encounter: Payer: Self-pay | Admitting: Internal Medicine

## 2020-09-29 ENCOUNTER — Other Ambulatory Visit: Payer: Self-pay

## 2020-09-29 VITALS — BP 118/67 | HR 80 | Temp 97.5°F | Resp 16 | Ht 63.0 in | Wt 188.0 lb

## 2020-09-29 DIAGNOSIS — K2950 Unspecified chronic gastritis without bleeding: Secondary | ICD-10-CM | POA: Diagnosis not present

## 2020-09-29 DIAGNOSIS — D122 Benign neoplasm of ascending colon: Secondary | ICD-10-CM

## 2020-09-29 DIAGNOSIS — Z1211 Encounter for screening for malignant neoplasm of colon: Secondary | ICD-10-CM

## 2020-09-29 DIAGNOSIS — K2970 Gastritis, unspecified, without bleeding: Secondary | ICD-10-CM

## 2020-09-29 DIAGNOSIS — K219 Gastro-esophageal reflux disease without esophagitis: Secondary | ICD-10-CM

## 2020-09-29 DIAGNOSIS — K3189 Other diseases of stomach and duodenum: Secondary | ICD-10-CM

## 2020-09-29 MED ORDER — SODIUM CHLORIDE 0.9 % IV SOLN: 500.0000 mL | Freq: Once | INTRAVENOUS | Status: DC

## 2020-09-29 NOTE — Progress Notes (Signed)
Called to room to assist during endoscopic procedure.  Patient ID and intended procedure confirmed with present staff. Received instructions for my participation in the procedure from the performing physician.  

## 2020-09-29 NOTE — Op Note (Signed)
[place] Patient Name: Cathy Guerrero Procedure Date: 09/29/2020 10:22 AM MRN: [id number] Endoscopist: Docia Chuck. Henrene Pastor , MD Age: 45 Referring MD:  Date of Birth: [birth year] Gender: Female Account #: [id number] Procedure:                Upper GI endoscopy with biopsies Indications:              Esophageal reflux. Requires PPI to control                            symptoms. Status post sleeve gastrectomy. Previous                            submucosal antral nodule with negative biopsies 2019 Medicines:                Monitored Anesthesia Care Procedure:                Pre-Anesthesia Assessment:                           - Prior to the procedure, a History and Physical                            was performed, and patient medications and                            allergies were reviewed. The patient's tolerance of                            previous anesthesia was also reviewed. The risks                            and benefits of the procedure and the sedation                            options and risks were discussed with the patient.                            All questions were answered, and informed consent                            was obtained. Prior Anticoagulants: The patient has                            taken no previous anticoagulant or antiplatelet                            agents. ASA Grade Assessment: II - A patient with                            mild systemic disease. After reviewing the risks                            and benefits, the patient was deemed in  satisfactory condition to undergo the procedure.                           After obtaining informed consent, the endoscope was                            passed under direct vision. Throughout the                            procedure, the patient's blood pressure, pulse, and                            oxygen saturations were monitored continuously. The                             GIF [id number] #[id number] was introduced through the                            mouth, and advanced to the second part of duodenum.                            The upper GI endoscopy was accomplished without                            difficulty. The patient tolerated the procedure                            well. Scope In: Scope Out: Findings:                 The esophagus was normal.                           Evidence of a sleeve gastrectomy was found in the                            stomach.                           The stomach revealed a 1.5 submucosal nodule in the                            antrum that appeared no different in size or                            character compared to 2019. Tunnel biopsies were                            taken with a cold forceps for histology. There was                            also a 5 mm submucosal nodule in the prepyloric                            antrum. This was not  biopsied. The remainder of the                            postoperative stomach was normal.                           The examined duodenum was normal.                           The cardia and gastric fundus were normal on                            retroflexion. Complications:            No immediate complications. Estimated Blood Loss:     Estimated blood loss: none. Impression:               - Normal esophagus.                           - A sleeve gastrectomy was found.                           - Submucosal gastric nodule. Biopsied.                           - Normal examined duodenum.                           -GERD Recommendation:           1. Reflux precautions                           2. Continue pantoprazole daily                           3. Follow-up biopsies                           4. Routine GI office follow-up 1 year                           . Docia Chuck. Henrene Pastor, MD 09/29/2020 11:42:43 AM This report has been signed electronically.

## 2020-09-29 NOTE — Op Note (Signed)
[place] Patient Name: Cathy Guerrero Procedure Date: 09/29/2020 10:29 AM MRN: [id number] Endoscopist: Docia Chuck. Henrene Pastor , MD Age: 45 Referring MD:  Date of Birth: May 26, 1975 Gender: Female Account #: [id number] Procedure:                Colonoscopy with cold snare polypectomy x 1 Indications:              Screening for colorectal malignant neoplasm Medicines:                Monitored Anesthesia Care Procedure:                Pre-Anesthesia Assessment:                           - Prior to the procedure, a History and Physical                            was performed, and patient medications and                            allergies were reviewed. The patient's tolerance of                            previous anesthesia was also reviewed. The risks                            and benefits of the procedure and the sedation                            options and risks were discussed with the patient.                            All questions were answered, and informed consent                            was obtained. Prior Anticoagulants: The patient has                            taken no previous anticoagulant or antiplatelet                            agents. ASA Grade Assessment: II - A patient with                            mild systemic disease. After reviewing the risks                            and benefits, the patient was deemed in                            satisfactory condition to undergo the procedure.                           After obtaining informed consent, the colonoscope  was passed under direct vision. Throughout the                            procedure, the patient's blood pressure, pulse, and                            oxygen saturations were monitored continuously. The                            Colonoscope was introduced through the anus and                            advanced to the the cecum, identified by                             appendiceal orifice and ileocecal valve. The                            ileocecal valve, appendiceal orifice, and rectum                            were photographed. The quality of the bowel                            preparation was excellent. The colonoscopy was                            performed without difficulty. The patient tolerated                            the procedure well. The bowel preparation used was                            SUPREP via split dose instruction. Scope In: 11:00:50 AM Scope Out: 11:18:33 AM Scope Withdrawal Time: 0 hours 13 minutes 42 seconds  Total Procedure Duration: 0 hours 17 minutes 43 seconds  Findings:                 A 3 mm polyp was found in the ascending colon. The                            polyp was sessile. The polyp was removed with a                            cold snare. Resection and retrieval were complete.                           The exam was otherwise without abnormality on                            direct and retroflexion views. Complications:            No immediate complications. Estimated blood loss:  None. Estimated Blood Loss:     Estimated blood loss: none. Impression:               - One 3 mm polyp in the ascending colon, removed                            with a cold snare. Resected and retrieved.                           - The examination was otherwise normal on direct                            and retroflexion views. Recommendation:           - Repeat colonoscopy in 5-10 years for surveillance.                           - Patient has a contact number available for                            emergencies. The signs and symptoms of potential                            delayed complications were discussed with the                            patient. Return to normal activities tomorrow.                            Written discharge instructions were provided to the                             patient.                           - Resume previous diet.                           - Continue present medications.                           - Await pathology results. Docia Chuck. Henrene Pastor, MD 09/29/2020 11:25:19 AM This report has been signed electronically.

## 2020-09-29 NOTE — Progress Notes (Signed)
Vs by CW in adm 

## 2020-09-29 NOTE — Progress Notes (Signed)
PT taken to PACU. Monitors in place. VSS. Report given to RN. 

## 2020-09-29 NOTE — Patient Instructions (Signed)
Thank you for allowing [place] to care for you today!  Await biopsy results, approximately 7-10 days.  Will make decision at that time for colonoscopy in 5-10 years.  Resume previous diet.  Resume previous medications including daily Pantoprazole.  Follow anti-reflux measures.  Return to normal daily activities tomorrow.  Routine GI follow-up in one year.  YOU HAD AN ENDOSCOPIC PROCEDURE TODAY AT [facility]:   Refer to the procedure report that was given to you for any specific questions about what was found during the examination.  If the procedure report does not answer your questions, please call your gastroenterologist to clarify.  If you requested that your care partner not be given the details of your procedure findings, then the procedure report has been included in a sealed envelope for you to review at your convenience later.  YOU SHOULD EXPECT: Some feelings of bloating in the abdomen. Passage of more gas than usual.  Walking can help get rid of the air that was put into your GI tract during the procedure and reduce the bloating. If you had a lower endoscopy (such as a colonoscopy or flexible sigmoidoscopy) you may notice spotting of blood in your stool or on the toilet paper. If you underwent a bowel prep for your procedure, you may not have a normal bowel movement for a few days.  Please Note:  You might notice some irritation and congestion in your nose or some drainage.  This is from the oxygen used during your procedure.  There is no need for concern and it should clear up in a day or so.  SYMPTOMS TO REPORT IMMEDIATELY:  Following lower endoscopy (colonoscopy or flexible sigmoidoscopy):  Excessive amounts of blood in the stool  Significant tenderness or worsening of abdominal pains  Swelling of the abdomen that is new, acute  Fever of 100F or higher  Following upper endoscopy (EGD)  Vomiting of blood or coffee ground material  New chest pain or pain under  the shoulder blades  Painful or persistently difficult swallowing  New shortness of breath  Fever of 100F or higher  Black, tarry-looking stools  For urgent or emergent issues, a gastroenterologist can be reached at any hour by calling [phone]. Do not use MyChart messaging for urgent concerns.    DIET:  We do recommend a small meal at first, but then you may proceed to your regular diet.  Drink plenty of fluids but you should avoid alcoholic beverages for 24 hours.  ACTIVITY:  You should plan to take it easy for the rest of today and you should NOT DRIVE or use heavy machinery until tomorrow (because of the sedation medicines used during the test).    FOLLOW UP: Our staff will call the number listed on your records 48-72 hours following your procedure to check on you and address any questions or concerns that you may have regarding the information given to you following your procedure. If we do not reach you, we will leave a message.  We will attempt to reach you two times.  During this call, we will ask if you have developed any symptoms of COVID 19. If you develop any symptoms (ie: fever, flu-like symptoms, shortness of breath, cough etc.) before then, please call [phone].  If you test positive for Covid 19 in the 2 weeks post procedure, please call and report this information to [place].    If any biopsies were taken you will be contacted by phone or by letter  within the next 1-3 weeks.  Please call us at [phone] if you have not heard about the biopsies in 3 weeks.    SIGNATURES/CONFIDENTIALITY: You and/or your care partner have signed paperwork which will be entered into your electronic medical record.  These signatures attest to the fact that that the information above on your After Visit Summary has been reviewed and is understood.  Full responsibility of the confidentiality of this discharge information lies with you and/or your care-partner.

## 2020-09-30 ENCOUNTER — Other Ambulatory Visit: Payer: Self-pay

## 2020-09-30 ENCOUNTER — Ambulatory Visit: Payer: BC Managed Care – PPO | Admitting: Internal Medicine

## 2020-09-30 ENCOUNTER — Ambulatory Visit (INDEPENDENT_AMBULATORY_CARE_PROVIDER_SITE_OTHER): Payer: BC Managed Care – PPO

## 2020-09-30 ENCOUNTER — Encounter: Payer: Self-pay | Admitting: Internal Medicine

## 2020-09-30 ENCOUNTER — Ambulatory Visit (INDEPENDENT_AMBULATORY_CARE_PROVIDER_SITE_OTHER): Payer: BC Managed Care – PPO | Admitting: Internal Medicine

## 2020-09-30 ENCOUNTER — Telehealth: Payer: Self-pay | Admitting: Internal Medicine

## 2020-09-30 VITALS — BP 126/84 | HR 95 | Temp 97.6°F | Ht 62.6 in | Wt 181.2 lb

## 2020-09-30 DIAGNOSIS — Z0001 Encounter for general adult medical examination with abnormal findings: Secondary | ICD-10-CM

## 2020-09-30 DIAGNOSIS — Z13220 Encounter for screening for lipoid disorders: Secondary | ICD-10-CM

## 2020-09-30 DIAGNOSIS — F419 Anxiety disorder, unspecified: Secondary | ICD-10-CM

## 2020-09-30 DIAGNOSIS — M25551 Pain in right hip: Secondary | ICD-10-CM

## 2020-09-30 DIAGNOSIS — Z1389 Encounter for screening for other disorder: Secondary | ICD-10-CM

## 2020-09-30 DIAGNOSIS — F439 Reaction to severe stress, unspecified: Secondary | ICD-10-CM

## 2020-09-30 DIAGNOSIS — E876 Hypokalemia: Secondary | ICD-10-CM | POA: Diagnosis not present

## 2020-09-30 DIAGNOSIS — M545 Low back pain, unspecified: Secondary | ICD-10-CM

## 2020-09-30 DIAGNOSIS — E559 Vitamin D deficiency, unspecified: Secondary | ICD-10-CM

## 2020-09-30 DIAGNOSIS — Z1231 Encounter for screening mammogram for malignant neoplasm of breast: Secondary | ICD-10-CM

## 2020-09-30 DIAGNOSIS — G8929 Other chronic pain: Secondary | ICD-10-CM

## 2020-09-30 DIAGNOSIS — Z0000 Encounter for general adult medical examination without abnormal findings: Secondary | ICD-10-CM

## 2020-09-30 DIAGNOSIS — Z1329 Encounter for screening for other suspected endocrine disorder: Secondary | ICD-10-CM

## 2020-09-30 DIAGNOSIS — Z13818 Encounter for screening for other digestive system disorders: Secondary | ICD-10-CM

## 2020-09-30 NOTE — Progress Notes (Signed)
Chief Complaint  Patient presents with   Annual Exam   Annual  1. C/o stress due to caring for adolescent niece who has cognitive d/o IQ 33 since sister and brother in law died years ago  2. Low back pain rad to right hip ongoing Xray today 4/10 right lower back with h/o falls x2  3. Wt gain disc healthy diet and exercise and reviewed labs 09/27/20   Review of Systems  Constitutional:  Negative for weight loss.  HENT:  Negative for hearing loss.   Eyes:  Negative for blurred vision.  Respiratory:  Negative for shortness of breath.   Cardiovascular:  Negative for chest pain.  Gastrointestinal:  Negative for abdominal pain.  Musculoskeletal:  Positive for back pain and joint pain.  Skin:  Negative for rash.  Psychiatric/Behavioral:  The patient is nervous/anxious.        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 Wadie Lessen [place] was on MTX, Humira, Prednisone; normal ESR 8, anti CCP neg, CRP neg, RF negative , ANA neg with h/o +ANA, anti DS DNA 08/2008   [place] spotted fever 5 years ago   +IgM serologies    Sleep apnea    RESOLVED SINCE GASTRIC BYPASS SURGERY IN 2015   SVT (supraventricular tachycardia) ([place]) Noticed 3 years ago   adenosine sensitive short PR SVT docymented by EKG 4/17   Syncope and collapse 3 years ago - Happened about 2 times - Last time    Seen by neuro and cardiology - increased heart rate related   Past Surgical History:  Procedure Laterality Date   [place] N/A 06/05/2017   CHOLECYSTECTOMY N/A 09/01/2014   Procedure: LAPAROSCOPIC CHOLECYSTECTOMY;  Surgeon: Bonner Puna, MD;   Location: [facility]S;  Service: General;  Laterality: N/A;   ENDOMETRIAL ABLATION  2010? Pt unsure   LAPAROSCOPIC GASTRIC SLEEVE RESECTION     longitudinal gastrectomy/lap restrictive 01/27/14 Dr. Kreg Shropshire   MOUTH SURGERY     REDUCTION MAMMAPLASTY Bilateral 2003   SVT ABLATION N/A 06/05/2017   Procedure: SVT ABLATION;  Surgeon: Thompson Grayer, MD;  Location: [facility];  Service: Cardiovascular;  Laterality: N/A;   TUBAL LIGATION  2001   Family History  Problem Relation Age of Onset   Heart disease Mother    Heart attack Mother    Stroke Mother    Hypertension Mother    Hyperlipidemia Mother    Arthritis Mother        Rheumatoid arthritis   Kidney disease Mother    Depression Mother    Diabetes Mother    Heart disease Father    Heart attack Father    Hypertension Father    Hyperlipidemia Father    Kidney disease Father    Diabetes Father    COPD Father    Heart attack Maternal Grandmother    Breast cancer Maternal Grandmother    Arthritis Maternal Grandfather        rheumatoid arthritis   Diabetes Maternal Grandfather    Sudden Cardiac Death  Sister        died 01/21/19 age 60   Ovarian cancer Sister        1/2 sister   Uterine cancer Sister        1/2 sister   Colon cancer Neg Hx    Esophageal cancer Neg Hx    Pancreatic cancer Neg Hx    Liver disease Neg Hx    Stomach cancer Neg Hx    Social History   Socioeconomic History   Marital status: Married    Spouse name: Donnie   Number of children: 3   Years of education: 16   Highest education level: Not on file  Occupational History   Occupation: Microbiologist: [employer]  Tobacco Use   Smoking status: Never   Smokeless tobacco: Never  Vaping Use   Vaping Use: Never used  Substance and Sexual Activity   Alcohol use: No   Drug use: No   Sexual activity: Yes    Birth control/protection: Other-see comments    Comment: Ablation  Other Topics Concern   Not on file  Social  History Narrative   Born in raised in [place], [place]. Currently resides in [place], [place]. Lives with husband (married since age 29 y.o), Donnie, and 3 children. 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 Enjoys camping, shopping, reading, and involved in church. Likes to ride motorcycle with husband and wears helmet (as of 01/2017 sold motorcycle)      New grandchild boy Milford Cage as of 02/13/19 71 weeks old   Social Determinants of Health   Financial Resource Strain: Not on file  Food Insecurity: Not on file  Transportation Needs: Not on file  Physical Activity: Not on file  Stress: Not on file  Social Connections: Not on file  Intimate Partner Violence: Not on file   Current Meds  Medication Sig   ALPRAZolam (XANAX) 0.25 MG tablet Take 1 tablet (0.25 mg total) by mouth daily as needed for anxiety.   Biotin 5000 MCG CAPS Take 5,000 mcg by mouth daily.    buPROPion (WELLBUTRIN XL) 150 MG 24 hr tablet Take 1 tablet (150 mg total) by mouth daily.   CALCIUM CARBONATE-VITAMIN D PO Take 1 tablet by mouth daily.   Cetirizine HCl 10 MG TBDP Take 1 tablet by mouth at bedtime as needed.   Magnesium 250 MG TABS Take 250 mg by mouth daily.    Multiple Vitamin (MULTIVITAMIN) capsule Take 1 capsule by mouth daily.   norethindrone (MICRONOR,CAMILA,ERRIN) 0.35 MG tablet Take 1 tablet by mouth daily.    pantoprazole (PROTONIX) 40 MG tablet Take 40 mg by mouth daily.   sertraline (ZOLOFT) 100 MG tablet Take 1 tablet (100 mg total) by mouth daily.   tretinoin (RETIN-A) 0.05 % cream Apply 1 application topically daily as needed (for face).   Allergies  Allergen Reactions   Amoxicillin Hives   Enbrel [Etanercept] Hives and Rash   Recent Results (from the past 2160 hour(s))  CBC with Differential/Platelet     Status: None   Collection Time: 09/27/20  7:58 AM  Result Value Ref Range   WBC 6.1 4.0 - 10.5 K/uL   RBC 4.44 3.87 - 5.11 MIL/uL   Hemoglobin 12.9 12.0 - 15.0 g/dL    HCT 37.9 36.0 - 46.0 %   MCV 85.4 80.0 - 100.0 fL   MCH 29.1 26.0 - 34.0 pg   MCHC 34.0 30.0 - 36.0 g/dL   RDW 12.5  11.5 - 15.5 %   Platelets 268 150 - 400 K/uL   nRBC 0.0 0.0 - 0.2 %   Neutrophils Relative % 50 %   Neutro Abs 3.1 1.7 - 7.7 K/uL   Lymphocytes Relative 41 %   Lymphs Abs 2.5 0.7 - 4.0 K/uL   Monocytes Relative 6 %   Monocytes Absolute 0.4 0.1 - 1.0 K/uL   Eosinophils Relative 2 %   Eosinophils Absolute 0.1 0.0 - 0.5 K/uL   Basophils Relative 1 %   Basophils Absolute 0.0 0.0 - 0.1 K/uL   Immature Granulocytes 0 %   Abs Immature Granulocytes 0.01 0.00 - 0.07 K/uL    Comment: Performed at [facility] [address], [place], [place] [address]  Comprehensive metabolic panel     Status: Abnormal   Collection Time: 09/27/20  7:58 AM  Result Value Ref Range   Sodium 138 135 - 145 mmol/L   Potassium 3.4 (L) 3.5 - 5.1 mmol/L   Chloride 105 98 - 111 mmol/L   CO2 27 22 - 32 mmol/L   Glucose, Bld 97 70 - 99 mg/dL    Comment: Glucose reference range applies only to samples taken after fasting for at least 8 hours.   BUN 15 6 - 20 mg/dL   Creatinine, Ser 0.98 0.44 - 1.00 mg/dL   Calcium 8.8 (L) 8.9 - 10.3 mg/dL   Total Protein 6.4 (L) 6.5 - 8.1 g/dL   Albumin 3.6 3.5 - 5.0 g/dL   AST 19 15 - 41 U/L   ALT 17 0 - 44 U/L   Alkaline Phosphatase 54 38 - 126 U/L   Total Bilirubin 0.7 0.3 - 1.2 mg/dL   GFR, Estimated >60 >60 mL/min    Comment: (NOTE) Calculated using the CKD-EPI Creatinine Equation (2021)    Anion gap 6 5 - 15    Comment: Performed at [facility], [place] [address] [place], [place] [address]  Lipid panel     Status: None   Collection Time: 09/27/20  7:58 AM  Result Value Ref Range   Cholesterol 135 0 - 200 mg/dL   Triglycerides 85 <150 mg/dL   HDL 47 >40 mg/dL   Total CHOL/HDL Ratio 2.9 RATIO   VLDL 17 0 - 40 mg/dL   LDL Cholesterol 71 0 - 99 mg/dL    Comment:        Total Cholesterol/HDL:CHD Risk Coronary Heart Disease Risk Table                      Men   Women  1/2 Average Risk   3.4   3.3  Average Risk       5.0   4.4  2 X Average Risk   9.6   7.1  3 X Average Risk  23.4   11.0        Use the calculated Patient Ratio above and the CHD Risk Table to determine the patient's CHD Risk.        ATP III CLASSIFICATION (LDL):  <100     mg/dL   Optimal  100-129  mg/dL   Near or Above                    Optimal  130-159  mg/dL   Borderline  160-189  mg/dL   High  >190     mg/dL   Very High Performed at [place],  [place] [address]   TSH     Status: None   Collection Time: 09/27/20  7:58 AM  Result Value Ref Range   TSH 2.538 0.350 - 4.500 uIU/mL    Comment: Performed by a 3rd Generation assay with a functional sensitivity of <=0.01 uIU/mL. Performed at [facility], [place] [address], [place], [place] [address]   VITAMIN D 25 Hydroxy (Vit-D Deficiency, Fractures)     Status: None   Collection Time: 09/27/20  7:58 AM  Result Value Ref Range   Vit D, 25-Hydroxy 62.33 30 - 100 ng/mL    Comment: (NOTE) Vitamin D deficiency has been defined by the [place] practice guideline as a level of serum 25-OH  vitamin D less than 20 ng/mL (1,2). The Endocrine Society went on to  further define vitamin D insufficiency as a level between 21 and 29  ng/mL (2).  1. IOM (Institute of Medicine). 2010. Dietary reference intakes for  calcium and D. [place]: The [employer]. 2. Holick MF, Binkley [place], Bischoff-Ferrari HA, et al. Evaluation,  treatment, and prevention of vitamin D deficiency: an Endocrine  Society clinical practice guideline, JCEM. 2011 Jul; 96(7): 1911-30.  Performed at [facility], [place] [address] [place], [place] [address]    Objective  Body mass index is 32.51 kg/m. Wt Readings from Last 3 Encounters:  09/30/20 181 lb 3.2 oz (82.2 kg)  09/29/20 188 lb (85.3 kg)  06/28/20 188 lb (85.3 kg)   Temp Readings  from Last 3 Encounters:  09/30/20 97.6 F (36.4 C) (Oral)  09/29/20 (!) 97.5 F (36.4 C)  04/10/18 98.2 F (36.8 C) (Oral)   BP Readings from Last 3 Encounters:  09/30/20 126/84  09/29/20 118/67  06/28/20 114/86   Pulse Readings from Last 3 Encounters:  09/30/20 95  09/29/20 80  06/28/20 93    Physical Exam Vitals and nursing note reviewed.  Constitutional:      Appearance: Normal appearance. She is well-developed and well-groomed. She is obese.  HENT:     Head: Normocephalic and atraumatic.  Eyes:     Conjunctiva/sclera: Conjunctivae normal.     Pupils: Pupils are equal, round, and reactive to light.  Cardiovascular:     Rate and Rhythm: Normal rate and regular rhythm.     Heart sounds: Normal heart sounds. No murmur heard. Abdominal:     General: Abdomen is flat. Bowel sounds are normal.  Skin:    General: Skin is warm and moist.  Neurological:     General: No focal deficit present.     Mental Status: She is alert and oriented to person, place, and time. Mental status is at baseline.     Gait: Gait normal.  Psychiatric:        Attention and Perception: Attention and perception normal.        Mood and Affect: Mood and affect normal.        Speech: Speech normal.        Behavior: Behavior normal. Behavior is cooperative.        Thought Content: Thought content normal.        Cognition and Memory: Cognition and memory normal.        Judgment: Judgment normal.    Assessment  Plan  Annual physical exam Had flu shot fall 2021  covid shot declines   Tdap utd  Declines HPV vx Hep B immune  MMR immune   Pap had  02/23/16 neg no h/o abnormal pap except 1991 possibly related to OCP use but resolved per pt due for repeat in 02/23/2019   -saw [facility] ob/gyn 10/01/18 for endometriosis Dr. Laddie Aquas refilled norethindrone 0.35 qd for endometrosis no pap done pelvic performed 174 wt doing some cardio as of 10/10/2018  As of 08/19/19 no cycles due to ablation x  18 years Pap due 02/2021 will call ob/gyn to schedule    Mammo 12/24/19 negative ordered GI breast center  ordered    EGD/Colonoscopy utd 09/29/20 see in chart   Obtained notes from [facility] rheumatology and arthritis center (Dr. Scarlette Shorts) now f/u rheumatology [place]/Dr. Sherilyn Dacosta dermatology near [place] in [place] in 2019 as of 09/30/20 no need for dermatology   Cont healthy diet and exercise    Hypokalemia Given high K food list   Screening mammogram for breast cancer - Plan: MM 3D SCREEN BREAST BILATERAL  Chronic right-sided low back pain without sciatica - Plan: DG Hip Unilat W OR W/O Pelvis 2-3 Views Right, DG Lumbar Spine Complete, Ambulatory referral to Physical Therapy Right hip pain - Plan: DG Hip Unilat W OR W/O Pelvis 2-3 Views Right, DG Lumbar Spine Complete, Ambulatory referral to Physical Therapy    Provider: Dr. Olivia Mackie McLean-Scocuzza-Internal Medicine

## 2020-09-30 NOTE — Telephone Encounter (Signed)
Patient no-showed today's appointment; appointment was for 10/01/19, provider notified for review of record. Letter sent for patient to call in and re-schedule.

## 2020-09-30 NOTE — Patient Instructions (Addendum)
[facility]  [phone] [address] [place] [address]   [facility] [place] [address] [phone]   [facility] [place] [address] [place] [address] [phone]   Heat/ice Aspercream with lidocaine  Lidocaine pain patches  Tylenol    [facility]    Low Back Sprain or Strain Rehab Ask your health care provider which exercises are safe for you. Do exercises exactly as told by your health care provider and adjust them as directed. It is normal to feel mild stretching, pulling, tightness, or discomfort as you do these exercises. Stop right away if you feel sudden pain or your pain gets worse. Do not begin these exercises until told by your health care provider. Stretching and range-of-motion exercises These exercises warm up your muscles and joints and improve the movement and flexibility of your back. These exercises also help to relieve pain, numbness,and tingling. Lumbar rotation  Lie on your back on a firm surface and bend your knees. Straighten your arms out to your sides so each arm forms a 90-degree angle (right angle) with a side of your body. Slowly move (rotate) both of your knees to one side of your body until you feel a stretch in your lower back (lumbar). Try not to let your shoulders lift off the floor. Hold this position for __________ seconds. Tense your abdominal muscles and slowly move your knees back to the starting position. Repeat this exercise on the other side of your body. Repeat __________ times. Complete this exercise __________ times a day. Single knee to chest  Lie on your back on a firm surface with both legs straight. Bend one of your knees. Use your hands to move your knee up toward your chest until you feel a gentle stretch in your lower back and buttock. Hold your leg in this position by holding on to the  front of your knee. Keep your other leg as straight as possible. Hold this position for __________ seconds. Slowly return to the starting position. Repeat with your other leg. Repeat __________ times. Complete this exercise __________ times a day. Prone extension on elbows  Lie on your abdomen on a firm surface (prone position). Prop yourself up on your elbows. Use your arms to help lift your chest up until you feel a gentle stretch in your abdomen and your lower back. This will place some of your body weight on your elbows. If this is uncomfortable, try stacking pillows under your chest. Your hips should stay down, against the surface that you are lying on. Keep your hip and back muscles relaxed. Hold this position for __________ seconds. Slowly relax your upper body and return to the starting position. Repeat __________ times. Complete this exercise __________ times a day. Strengthening exercises These exercises build strength and endurance in your back. Endurance is theability to use your muscles for a long time, even after they get tired. Pelvic tilt This exercise strengthens the muscles that lie deep in the abdomen. Lie on your back on a firm surface. Bend your knees and keep your feet flat on the floor. Tense your abdominal muscles. Tip your pelvis up toward the ceiling and flatten your lower back into the floor. To help with this exercise, you may place a small towel under your lower back and try to push your back into the towel. Hold this position for __________ seconds. Let your muscles  relax completely before you repeat this exercise. Repeat __________ times. Complete this exercise __________ times a day. Alternating arm and leg raises  Get on your hands and knees on a firm surface. If you are on a hard floor, you may want to use padding, such as an exercise mat, to cushion your knees. Line up your arms and legs. Your hands should be directly below your shoulders, and your  knees should be directly below your hips. Lift your left leg behind you. At the same time, raise your right arm and straighten it in front of you. Do not lift your leg higher than your hip. Do not lift your arm higher than your shoulder. Keep your abdominal and back muscles tight. Keep your hips facing the ground. Do not arch your back. Keep your balance carefully, and do not hold your breath. Hold this position for __________ seconds. Slowly return to the starting position. Repeat with your right leg and your left arm. Repeat __________ times. Complete this exercise __________ times a day. Abdominal set with straight leg raise  Lie on your back on a firm surface. Bend one of your knees and keep your other leg straight. Tense your abdominal muscles and lift your straight leg up, 4-6 inches (10-15 cm) off the ground. Keep your abdominal muscles tight and hold this position for __________ seconds. Do not hold your breath. Do not arch your back. Keep it flat against the ground. Keep your abdominal muscles tense as you slowly lower your leg back to the starting position. Repeat with your other leg. Repeat __________ times. Complete this exercise __________ times a day. Single leg lower with bent knees Lie on your back on a firm surface. Tense your abdominal muscles and lift your feet off the floor, one foot at a time, so your knees and hips are bent in 90-degree angles (right angles). Your knees should be over your hips and your lower legs should be parallel to the floor. Keeping your abdominal muscles tense and your knee bent, slowly lower one of your legs so your toe touches the ground. Lift your leg back up to return to the starting position. Do not hold your breath. Do not let your back arch. Keep your back flat against the ground. Repeat with your other leg. Repeat __________ times. Complete this exercise __________ times a day. Posture and body mechanics Good posture and healthy  body mechanics can help to relieve stress in your body's tissues and joints. Body mechanics refers to the movements and positions of your body while you do your daily activities. Posture is part of body mechanics. Good posture means: Your spine is in its natural S-curve position (neutral). Your shoulders are pulled back slightly. Your head is not tipped forward. Follow these guidelines to improve your posture and body mechanics in youreveryday activities. Standing  When standing, keep your spine neutral and your feet about hip width apart. Keep a slight bend in your knees. Your ears, shoulders, and hips should line up. When you do a task in which you stand in one place for a long time, place one foot up on a stable object that is 2-4 inches (5-10 cm) high, such as a footstool. This helps keep your spine neutral.  Sitting  When sitting, keep your spine neutral and keep your feet flat on the floor. Use a footrest, if necessary, and keep your thighs parallel to the floor. Avoid rounding your shoulders, and avoid tilting your head forward. When working at a  desk or a computer, keep your desk at a height where your hands are slightly lower than your elbows. Slide your chair under your desk so you are close enough to maintain good posture. When working at a computer, place your monitor at a height where you are looking straight ahead and you do not have to tilt your head forward or downward to look at the screen.  Resting When lying down and resting, avoid positions that are most painful for you. If you have pain with activities such as sitting, bending, stooping, or squatting, lie in a position in which your body does not bend very much. For example, avoid curling up on your side with your arms and knees near your chest (fetal position). If you have pain with activities such as standing for a long time or reaching with your arms, lie with your spine in a neutral position and bend your knees slightly.  Try the following positions: Lying on your side with a pillow between your knees. Lying on your back with a pillow under your knees. Lifting  When lifting objects, keep your feet at least shoulder width apart and tighten your abdominal muscles. Bend your knees and hips and keep your spine neutral. It is important to lift using the strength of your legs, not your back. Do not lock your knees straight out. Always ask for help to lift heavy or awkward objects.  This information is not intended to replace advice given to you by your health care provider. Make sure you discuss any questions you have with your healthcare provider. Document Revised: 06/21/2018 Document Reviewed: 03/21/2018 Elsevier Patient Education  [place].  Back Exercises The following exercises strengthen the muscles that help to support the trunk and back. They also help to keep the lower back flexible. Doing these exercises can help to prevent back pain or lessen existing pain. If you have back pain or discomfort, try doing these exercises 2-3 times each day or as told by your health care provider. As your pain improves, do them once each day, but increase the number of times that you repeat the steps for each exercise (do more repetitions). To prevent the recurrence of back pain, continue to do these exercises once each day or as told by your health care provider. Do exercises exactly as told by your health care provider and adjust them as directed. It is normal to feel mild stretching, pulling, tightness, or discomfort as you do these exercises, but you should stop right away if youfeel sudden pain or your pain gets worse. Exercises Single knee to chest Repeat these steps 3-5 times for each leg: Lie on your back on a firm bed or the floor with your legs extended. Bring one knee to your chest. Your other leg should stay extended and in contact with the floor. Hold your knee in place by grabbing your knee or thigh  with both hands and hold. Pull on your knee until you feel a gentle stretch in your lower back or buttocks. Hold the stretch for 10-30 seconds. Slowly release and straighten your leg. Pelvic tilt Repeat these steps 5-10 times: Lie on your back on a firm bed or the floor with your legs extended. Bend your knees so they are pointing toward the ceiling and your feet are flat on the floor. Tighten your lower abdominal muscles to press your lower back against the floor. This motion will tilt your pelvis so your tailbone points up toward the ceiling instead  of pointing to your feet or the floor. With gentle tension and even breathing, hold this position for 5-10 seconds. Cat-cow Repeat these steps until your lower back becomes more flexible: Get into a hands-and-knees position on a firm surface. Keep your hands under your shoulders, and keep your knees under your hips. You may place padding under your knees for comfort. Let your head hang down toward your chest. Contract your abdominal muscles and point your tailbone toward the floor so your lower back becomes rounded like the back of a cat. Hold this position for 5 seconds. Slowly lift your head, let your abdominal muscles relax and point your tailbone up toward the ceiling so your back forms a sagging arch like the back of a cow. Hold this position for 5 seconds.  Press-ups Repeat these steps 5-10 times: Lie on your abdomen (face-down) on the floor. Place your palms near your head, about shoulder-width apart. Keeping your back as relaxed as possible and keeping your hips on the floor, slowly straighten your arms to raise the top half of your body and lift your shoulders. Do not use your back muscles to raise your upper torso. You may adjust the placement of your hands to make yourself more comfortable. Hold this position for 5 seconds while you keep your back relaxed. Slowly return to lying flat on the floor.  Bridges Repeat these steps 10  times: Lie on your back on a firm surface. Bend your knees so they are pointing toward the ceiling and your feet are flat on the floor. Your arms should be flat at your sides, next to your body. Tighten your buttocks muscles and lift your buttocks off the floor until your waist is at almost the same height as your knees. You should feel the muscles working in your buttocks and the back of your thighs. If you do not feel these muscles, slide your feet 1-2 inches farther away from your buttocks. Hold this position for 3-5 seconds. Slowly lower your hips to the starting position, and allow your buttocks muscles to relax completely. If this exercise is too easy, try doing it with your arms crossed over yourchest. Abdominal crunches Repeat these steps 5-10 times: Lie on your back on a firm bed or the floor with your legs extended. Bend your knees so they are pointing toward the ceiling and your feet are flat on the floor. Cross your arms over your chest. Tip your chin slightly toward your chest without bending your neck. Tighten your abdominal muscles and slowly raise your trunk (torso) high enough to lift your shoulder blades a tiny bit off the floor. Avoid raising your torso higher than that because it can put too much stress on your low back and does not help to strengthen your abdominal muscles. Slowly return to your starting position. Back lifts Repeat these steps 5-10 times: Lie on your abdomen (face-down) with your arms at your sides, and rest your forehead on the floor. Tighten the muscles in your legs and your buttocks. Slowly lift your chest off the floor while you keep your hips pressed to the floor. Keep the back of your head in line with the curve in your back. Your eyes should be looking at the floor. Hold this position for 3-5 seconds. Slowly return to your starting position. Contact a health care provider if: Your back pain or discomfort gets much worse when you do an  exercise. Your worsening back pain or discomfort does not lessen within 2 hours  after you exercise. If you have any of these problems, stop doing these exercises right away. Do not do them again unless your health care provider says that you can. Get help right away if: You develop sudden, severe back pain. If this happens, stop doing the exercises right away. Do not do them again unless your health care provider says that you can. This information is not intended to replace advice given to you by your health care provider. Make sure you discuss any questions you have with your healthcare provider. Document Revised: 07/04/2018 Document Reviewed: 11/29/2017 Elsevier Patient Education  [place].

## 2020-10-01 ENCOUNTER — Telehealth: Payer: Self-pay | Admitting: *Deleted

## 2020-10-01 NOTE — Telephone Encounter (Signed)
  Follow up Call-  Call back number 09/29/2020  Post procedure Call Back phone  # [phone]  Permission to leave phone message Yes  Some recent data might be hidden    Per pt's husband she is doing "fine".  Patient questions:  Do you have a fever, pain , or abdominal swelling? No. Pain Score  0 *  Have you tolerated food without any problems? Yes.    Have you been able to return to your normal activities? Yes.    Do you have any questions about your discharge instructions: Diet   No. Medications  No. Follow up visit  No.  Do you have questions or concerns about your Care? No.  Actions: * If pain score is 4 or above: No action needed, pain <4.  Have you developed a fever since your procedure? no  2.   Have you had an respiratory symptoms (SOB or cough) since your procedure? no  3.   Have you tested positive for COVID 19 since your procedure no  4.   Have you had any family members/close contacts diagnosed with the COVID 19 since your procedure?  no   If yes to any of these questions please route to Joylene John, RN and Joella Prince, RN

## 2020-10-04 ENCOUNTER — Encounter: Payer: Self-pay | Admitting: Internal Medicine

## 2020-10-05 ENCOUNTER — Other Ambulatory Visit: Payer: Self-pay

## 2020-10-05 ENCOUNTER — Encounter: Payer: Self-pay | Admitting: Internal Medicine

## 2020-10-05 NOTE — Telephone Encounter (Signed)
Patient has not had this filled by you in the past. Does not have a diagnosis of HSV 1 in the chart.   Does Patient need an appointment?

## 2020-10-06 ENCOUNTER — Other Ambulatory Visit: Payer: Self-pay | Admitting: Internal Medicine

## 2020-10-06 DIAGNOSIS — B001 Herpesviral vesicular dermatitis: Secondary | ICD-10-CM

## 2020-10-06 MED ORDER — VALACYCLOVIR HCL 500 MG PO TABS: 500.0000 mg | ORAL_TABLET | Freq: Two times a day (BID) | ORAL | 11 refills | Status: AC

## 2021-01-10 ENCOUNTER — Other Ambulatory Visit: Payer: Self-pay | Admitting: Internal Medicine

## 2021-01-10 DIAGNOSIS — F339 Major depressive disorder, recurrent, unspecified: Secondary | ICD-10-CM

## 2021-02-23 DIAGNOSIS — H40019 Open angle with borderline findings, low risk, unspecified eye: Secondary | ICD-10-CM | POA: Diagnosis not present

## 2021-02-23 DIAGNOSIS — H5213 Myopia, bilateral: Secondary | ICD-10-CM | POA: Diagnosis not present

## 2021-04-03 ENCOUNTER — Other Ambulatory Visit: Payer: Self-pay | Admitting: Internal Medicine

## 2021-04-03 DIAGNOSIS — F419 Anxiety disorder, unspecified: Secondary | ICD-10-CM

## 2021-04-03 DIAGNOSIS — G4700 Insomnia, unspecified: Secondary | ICD-10-CM

## 2021-09-30 ENCOUNTER — Encounter: Payer: Self-pay | Admitting: Internal Medicine

## 2021-10-03 ENCOUNTER — Encounter: Payer: Self-pay | Admitting: Internal Medicine

## 2021-10-03 NOTE — Addendum Note (Signed)
Addended by: Orland Mustard on: 10/03/2021 10:09 PM   Modules accepted: Orders

## 2021-10-05 ENCOUNTER — Encounter: Payer: BC Managed Care – PPO | Admitting: Internal Medicine

## 2021-11-01 ENCOUNTER — Other Ambulatory Visit (HOSPITAL_COMMUNITY)
Admission: RE | Admit: 2021-11-01 | Discharge: 2021-11-01 | Disposition: A | Discharge status: Home / Self Care | Payer: BC Managed Care – PPO | Source: Ambulatory Visit | Attending: Internal Medicine | Admitting: Internal Medicine

## 2021-11-01 ENCOUNTER — Other Ambulatory Visit: Payer: Self-pay

## 2021-11-01 ENCOUNTER — Telehealth: Payer: Self-pay

## 2021-11-01 DIAGNOSIS — Z1389 Encounter for screening for other disorder: Secondary | ICD-10-CM

## 2021-11-01 DIAGNOSIS — Z13818 Encounter for screening for other digestive system disorders: Secondary | ICD-10-CM

## 2021-11-01 DIAGNOSIS — E559 Vitamin D deficiency, unspecified: Secondary | ICD-10-CM

## 2021-11-01 DIAGNOSIS — Z1329 Encounter for screening for other suspected endocrine disorder: Secondary | ICD-10-CM

## 2021-11-01 DIAGNOSIS — Z13220 Encounter for screening for lipoid disorders: Secondary | ICD-10-CM

## 2021-11-01 DIAGNOSIS — Z0000 Encounter for general adult medical examination without abnormal findings: Secondary | ICD-10-CM

## 2021-11-01 DIAGNOSIS — Z0001 Encounter for general adult medical examination with abnormal findings: Secondary | ICD-10-CM

## 2021-11-01 DIAGNOSIS — E876 Hypokalemia: Secondary | ICD-10-CM

## 2021-11-01 NOTE — Telephone Encounter (Signed)
Pt called stating she is still at the hospital waiting for the labs to be released

## 2021-11-01 NOTE — Telephone Encounter (Signed)
Patient states she is in the lab at [facility] and they are telling her the orders need to be released.  I spoke with Everrett Coombe, CMA, and he states Dr. Olivia Mackie has to release the lab orders, so we will wait for her.  I relayed Juan's message to patient.  Patient is not happy because she had to leave work to come to the lab and now she is having to wait.  I apologized to patient.

## 2021-11-02 LAB — LIPID PANEL
Cholesterol: 137 mg/dL (ref 0–200)
HDL: 47 mg/dL (ref >40)
LDL Cholesterol: 76 mg/dL (ref 0–99)
Total CHOL/HDL Ratio: 2.9 RATIO
Triglycerides: 70 mg/dL (ref <150)
VLDL: 14 mg/dL (ref 0–40)

## 2021-11-02 LAB — CBC WITH DIFFERENTIAL/PLATELET
Abs Immature Granulocytes: 0.02 10*3/uL (ref 0.00–0.07)
Basophils Absolute: 0.1 10*3/uL (ref 0.0–0.1)
Basophils Relative: 1 %
Eosinophils Absolute: 0.1 10*3/uL (ref 0.0–0.5)
Eosinophils Relative: 2 %
HCT: 38.2 % (ref 36.0–46.0)
Hemoglobin: 12.8 g/dL (ref 12.0–15.0)
Immature Granulocytes: 0 %
Lymphocytes Relative: 37 %
Lymphs Abs: 2.2 10*3/uL (ref 0.7–4.0)
MCH: 28.1 pg (ref 26.0–34.0)
MCHC: 33.5 g/dL (ref 30.0–36.0)
MCV: 83.8 fL (ref 80.0–100.0)
Monocytes Absolute: 0.5 10*3/uL (ref 0.1–1.0)
Monocytes Relative: 8 %
Neutro Abs: 3.1 10*3/uL (ref 1.7–7.7)
Neutrophils Relative %: 52 %
Platelets: 287 10*3/uL (ref 150–400)
RBC: 4.56 MIL/uL (ref 3.87–5.11)
RDW: 12.5 % (ref 11.5–15.5)
WBC: 6 10*3/uL (ref 4.0–10.5)
nRBC: 0 % (ref 0.0–0.2)

## 2021-11-02 LAB — VITAMIN D 25 HYDROXY (VIT D DEFICIENCY, FRACTURES): Vit D, 25-Hydroxy: 42.31 ng/mL (ref 30–100)

## 2021-11-02 LAB — COMPREHENSIVE METABOLIC PANEL
ALT: 16 U/L (ref 0–44)
AST: 19 U/L (ref 15–41)
Albumin: 4 g/dL (ref 3.5–5.0)
Alkaline Phosphatase: 55 U/L (ref 38–126)
Anion gap: 6 (ref 5–15)
BUN: 11 mg/dL (ref 6–20)
CO2: 27 mmol/L (ref 22–32)
Calcium: 9.1 mg/dL (ref 8.9–10.3)
Chloride: 105 mmol/L (ref 98–111)
Creatinine, Ser: 0.99 mg/dL (ref 0.44–1.00)
GFR, Estimated: >60 mL/min (ref >60)
Glucose, Bld: 96 mg/dL (ref 70–99)
Potassium: 4.2 mmol/L (ref 3.5–5.1)
Sodium: 138 mmol/L (ref 135–145)
Total Bilirubin: 0.3 mg/dL (ref 0.3–1.2)
Total Protein: 6.7 g/dL (ref 6.5–8.1)

## 2021-11-02 LAB — TSH: TSH: 2.285 u[IU]/mL (ref 0.350–4.500)

## 2021-11-02 LAB — URINALYSIS, ROUTINE W REFLEX MICROSCOPIC
Bilirubin Urine: NEGATIVE
Glucose, UA: NEGATIVE mg/dL
Hgb urine dipstick: NEGATIVE
Ketones, ur: NEGATIVE mg/dL
Leukocytes,Ua: NEGATIVE
Nitrite: NEGATIVE
Protein, ur: NEGATIVE mg/dL
Specific Gravity, Urine: 1.009 (ref 1.005–1.030)
pH: 7 (ref 5.0–8.0)

## 2021-11-02 LAB — HEPATITIS C ANTIBODY: HCV Ab: NONREACTIVE

## 2021-11-02 LAB — MAGNESIUM: Magnesium: 2.1 mg/dL (ref 1.7–2.4)

## 2021-11-03 ENCOUNTER — Ambulatory Visit (INDEPENDENT_AMBULATORY_CARE_PROVIDER_SITE_OTHER): Payer: BC Managed Care – PPO | Admitting: Internal Medicine

## 2021-11-03 ENCOUNTER — Encounter: Payer: Self-pay | Admitting: Internal Medicine

## 2021-11-03 VITALS — BP 130/78 | HR 103 | Temp 98.5°F | Ht 62.6 in | Wt 185.6 lb

## 2021-11-03 DIAGNOSIS — G4700 Insomnia, unspecified: Secondary | ICD-10-CM

## 2021-11-03 DIAGNOSIS — F339 Major depressive disorder, recurrent, unspecified: Secondary | ICD-10-CM | POA: Diagnosis not present

## 2021-11-03 DIAGNOSIS — Z0000 Encounter for general adult medical examination without abnormal findings: Secondary | ICD-10-CM

## 2021-11-03 DIAGNOSIS — L700 Acne vulgaris: Secondary | ICD-10-CM | POA: Insufficient documentation

## 2021-11-03 DIAGNOSIS — F419 Anxiety disorder, unspecified: Secondary | ICD-10-CM | POA: Diagnosis not present

## 2021-11-03 DIAGNOSIS — J309 Allergic rhinitis, unspecified: Secondary | ICD-10-CM

## 2021-11-03 MED ORDER — TRETINOIN 0.05 % EX CREA: 1.0000 "application " | TOPICAL_CREAM | Freq: Every day | CUTANEOUS | 11 refills | Status: AC | PRN

## 2021-11-03 MED ORDER — BUPROPION HCL ER (XL) 150 MG PO TB24: 150.0000 mg | ORAL_TABLET | Freq: Every day | ORAL | 3 refills | Status: DC

## 2021-11-03 MED ORDER — ESZOPICLONE 2 MG PO TABS: ORAL_TABLET | ORAL | 5 refills | Status: DC

## 2021-11-03 MED ORDER — SERTRALINE HCL 100 MG PO TABS: ORAL_TABLET | ORAL | 3 refills | Status: DC

## 2021-11-03 MED ORDER — ALPRAZOLAM 0.25 MG PO TABS: ORAL_TABLET | ORAL | 5 refills | Status: DC

## 2021-11-03 MED ORDER — PANTOPRAZOLE SODIUM 40 MG PO TBEC: DELAYED_RELEASE_TABLET | ORAL | 3 refills | Status: DC

## 2021-11-03 NOTE — Patient Instructions (Addendum)
Dr. Billey Gosling [facility]   Phone Fax E-mail Address  [phone] Not available [place] [address]     Specialties     Internal Medicine      Semaglutide Injection (Weight Management) What is this medication? SEMAGLUTIDE (SEM a GLOO tide) promotes weight loss. It may also be used to maintain weight loss. It works by decreasing appetite. Changes to diet and exercise are often combined with this medication. This medicine may be used for other purposes; ask your health care provider or pharmacist if you have questions. COMMON BRAND NAME(S): [id number] What should I tell my care team before I take this medication? They need to know if you have any of these conditions: Endocrine tumors (MEN 2) or if someone in your family had these tumors Eye disease, vision problems Gallbladder disease History of depression or mental health disease History of pancreatitis Kidney disease Stomach or intestine problems Suicidal thoughts, plans, or attempt; a previous suicide attempt by you or a family member Thyroid cancer or if someone in your family had thyroid cancer An unusual or allergic reaction to semaglutide, other medications, foods, dyes, or preservatives Pregnant or trying to get pregnant Breast-feeding How should I use this medication? This medication is injected under the skin. You will be taught how to prepare and give it. Take it as directed on the prescription label. It is given once every week (every 7 days). Keep taking it unless your care team tells you to stop. It is important that you put your used needles and pens in a special sharps container. Do not put them in a trash can. If you do not have a sharps container, call your pharmacist or care team to get one. A special MedGuide will be given to you by the pharmacist with each prescription and refill. Be sure to read this information carefully each time. This medication comes with INSTRUCTIONS FOR USE. Ask  your pharmacist for directions on how to use this medication. Read the information carefully. Talk to your pharmacist or care team if you have questions. Talk to your care team about the use of this medication in children. While it may be prescribed for children as young as 12 years for selected conditions, precautions do apply. Overdosage: If you think you have taken too much of this medicine contact a poison control center or emergency room at once. NOTE: This medicine is only for you. Do not share this medicine with others. What if I miss a dose? If you miss a dose and the next scheduled dose is more than 2 days away, take the missed dose as soon as possible. If you miss a dose and the next scheduled dose is less than 2 days away, do not take the missed dose. Take the next dose at your regular time. Do not take double or extra doses. If you miss your dose for 2 weeks or more, take the next dose at your regular time or call your care team to talk about how to restart this medication. What may interact with this medication? Insulin and other medications for diabetes This list may not describe all possible interactions. Give your health care provider a list of all the medicines, herbs, non-prescription drugs, or dietary supplements you use. Also tell them if you smoke, drink alcohol, or use illegal drugs. Some items may interact with your medicine. What should I watch for while using this medication? Visit your care team for regular checks on your  progress. It may be some time before you see the benefit from this medication. Drink plenty of fluids while taking this medication. Check with your care team if you have severe diarrhea, nausea, and vomiting, or if you sweat a lot. The loss of too much body fluid may make it dangerous for you to take this medication. This medication may affect blood sugar levels. Ask your care team if changes in diet or medications are needed if you have diabetes. If you or  your family notice any changes in your behavior, such as new or worsening depression, thoughts of harming yourself, anxiety, other unusual or disturbing thoughts, or memory loss, call your care team right away. Women should inform their care team if they wish to become pregnant or think they might be pregnant. Losing weight while pregnant is not advised and may cause harm to the unborn child. Talk to your care team for more information. What side effects may I notice from receiving this medication? Side effects that you should report to your care team as soon as possible: Allergic reactions--skin rash, itching, hives, swelling of the face, lips, tongue, or throat Change in vision Dehydration--increased thirst, dry mouth, feeling faint or lightheaded, headache, dark yellow or brown urine Gallbladder problems--severe stomach pain, nausea, vomiting, fever Heart palpitations--rapid, pounding, or irregular heartbeat Kidney injury--decrease in the amount of urine, swelling of the ankles, hands, or feet Pancreatitis--severe stomach pain that spreads to your back or gets worse after eating or when touched, fever, nausea, vomiting Thoughts of suicide or self-harm, worsening mood, feelings of depression Thyroid cancer--new mass or lump in the neck, pain or trouble swallowing, trouble breathing, hoarseness Side effects that usually do not require medical attention (report to your care team if they continue or are bothersome): Diarrhea Loss of appetite Nausea Stomach pain Vomiting This list may not describe all possible side effects. Call your doctor for medical advice about side effects. You may report side effects to FDA at 1-800-FDA-1088. Where should I keep my medication? Keep out of the reach of children and pets. Refrigeration (preferred): Store in the refrigerator. Do not freeze. Keep this medication in the original container until you are ready to take it. Get rid of any unused medication after the  expiration date. Room temperature: If needed, prior to cap removal, the pen can be stored at room temperature for up to 28 days. Protect from light. If it is stored at room temperature, get rid of any unused medication after 28 days or after it expires, whichever is first. It is important to get rid of the medication as soon as you no longer need it or it is expired. You can do this in two ways: Take the medication to a medication take-back program. Check with your pharmacy or law enforcement to find a location. If you cannot return the medication, follow the directions in the [place]. NOTE: This sheet is a summary. It may not cover all possible information. If you have questions about this medicine, talk to your doctor, pharmacist, or health care provider.  2023 Elsevier/Gold Standard (2020-05-13 00:00:00)

## 2021-11-03 NOTE — Progress Notes (Signed)
Chief Complaint  Patient presents with   Annual Exam   Annual  1. Anxiety/depression/insomnia doing well needs refill of xanax 0.25 qd prn, wellbutrin xl 150 mg qd, zoloft 100 mg qd, lunesta 2 mg qhs  2. Acne on retina appt with dermatology upcoming needs refills     Review of Systems  Constitutional:  Negative for weight loss.  HENT:  Negative for hearing loss.   Eyes:  Negative for blurred vision.  Respiratory:  Negative for shortness of breath.   Cardiovascular:  Negative for chest pain.  Gastrointestinal:  Negative for abdominal pain and blood in stool.  Genitourinary:  Negative for dysuria.  Musculoskeletal:  Negative for falls and joint pain.  Skin:  Negative for rash.  Neurological:  Negative for headaches.  Psychiatric/Behavioral:  Negative for depression.    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 Wadie Lessen Conrad was on MTX, Humira, Prednisone; normal ESR 8, anti CCP neg, CRP neg, RF negative , ANA neg with h/o +ANA, anti DS DNA 08/2008   [place] spotted fever 5 years ago   +IgM serologies    Sleep apnea    RESOLVED SINCE GASTRIC BYPASS SURGERY IN 2015   SVT (supraventricular tachycardia) ([place]) Noticed 3 years ago   adenosine sensitive short PR SVT docymented by EKG 4/17   Syncope and collapse 3 years ago - Happened about 2 times - Last time    Seen by neuro and cardiology - increased heart rate related   Past Surgical History:  Procedure Laterality Date   [place] N/A 06/05/2017   CHOLECYSTECTOMY N/A 09/01/2014   Procedure: LAPAROSCOPIC CHOLECYSTECTOMY;  Surgeon: Bonner Puna, MD;  Location:  [facility] ORS;  Service: General;  Laterality: N/A;   ENDOMETRIAL ABLATION  2010? Pt unsure   LAPAROSCOPIC GASTRIC SLEEVE RESECTION     longitudinal gastrectomy/lap restrictive 01/27/14 Dr. Kreg Shropshire   MOUTH SURGERY     REDUCTION MAMMAPLASTY Bilateral 2003   SVT ABLATION N/A 06/05/2017   Procedure: SVT ABLATION;  Surgeon: Thompson Grayer, MD;  Location: [facility];  Service: Cardiovascular;  Laterality: N/A;   TUBAL LIGATION  2001   Family History  Problem Relation Age of Onset   Heart disease Mother    Heart attack Mother    Stroke Mother    Hypertension Mother    Hyperlipidemia Mother    Arthritis Mother        Rheumatoid arthritis   Kidney disease Mother    Depression Mother    Diabetes Mother    Heart disease Father    Heart attack Father    Hypertension Father    Hyperlipidemia Father    Kidney disease Father    Diabetes Father    COPD Father    Heart attack Maternal Grandmother    Breast cancer Maternal Grandmother    Arthritis Maternal Grandfather        rheumatoid arthritis   Diabetes Maternal Grandfather    Sudden Cardiac Death Sister        died 01/28/2019 age 22  Ovarian cancer Sister        1/2 sister   Uterine cancer Sister        1/2 sister   Colon cancer Neg Hx    Esophageal cancer Neg Hx    Pancreatic cancer Neg Hx    Liver disease Neg Hx    Stomach cancer Neg Hx    Social History   Socioeconomic History   Marital status: Married    Spouse name: Donnie   Number of children: 3   Years of education: 16   Highest education level: Not on file  Occupational History   Occupation: Microbiologist: [employer]  Tobacco Use   Smoking status: Never   Smokeless tobacco: Never  Vaping Use   Vaping Use: Never used  Substance and Sexual Activity   Alcohol use: No   Drug use: No   Sexual activity: Yes    Birth control/protection: Other-see comments    Comment: Ablation  Other Topics Concern   Not on file  Social History  Narrative   Born in raised in [place], [place]. Currently resides in [place], [place]. Lives with husband (married since age 26 y.o), Donnie, and 3 children. 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 Enjoys camping, shopping, reading, and involved in church. Likes to ride motorcycle with husband and wears helmet (as of 01/2017 sold motorcycle)      New grandchild boy Milford Cage as of 02/13/19 26 weeks old   Social Determinants of Health   Financial Resource Strain: Not on file  Food Insecurity: Not on file  Transportation Needs: Not on file  Physical Activity: Not on file  Stress: Not on file  Social Connections: Not on file  Intimate Partner Violence: Not on file   Current Meds  Medication Sig   CALCIUM CARBONATE-VITAMIN D PO Take 1 tablet by mouth daily.   Cetirizine HCl 10 MG TBDP Take 1 tablet by mouth at bedtime as needed.   Magnesium 250 MG TABS Take 250 mg by mouth daily.    Multiple Vitamin (MULTIVITAMIN) capsule Take 1 capsule by mouth daily.   norethindrone (MICRONOR,CAMILA,ERRIN) 0.35 MG tablet Take 1 tablet by mouth daily.    valACYclovir (VALTREX) 500 MG tablet Take 1 tablet (500 mg total) by mouth 2 (two) times daily. X 3-7 days prn outbreak   [DISCONTINUED] ALPRAZolam (XANAX) 0.25 MG tablet TAKE 1 TABLET(0.25 MG) BY MOUTH DAILY AS NEEDED FOR ANXIETY   [DISCONTINUED] buPROPion (WELLBUTRIN XL) 150 MG 24 hr tablet TAKE 1 TABLET DAILY   [DISCONTINUED] eszopiclone (LUNESTA) 2 MG TABS tablet TAKE 1 TABLET(2 MG) BY MOUTH AT BEDTIME AS NEEDED FOR SLEEP. TAKE IMMEDIATELY BEFORE BEDTIME   [DISCONTINUED] pantoprazole (PROTONIX) 40 MG tablet TAKE 1 TABLET DAILY BEFORE SUPPER 30 MINUTES BEFORE A MEAL (STOP PRILOSEC 20 MG)   [DISCONTINUED] sertraline (ZOLOFT) 100 MG tablet TAKE 1 TABLET DAILY (DOSE INCREASE)   [DISCONTINUED] tretinoin (RETIN-A) 0.05 % cream Apply 1 application topically daily as needed (for face).   Allergies  Allergen Reactions   Amoxicillin Hives    Enbrel [Etanercept] Hives and Rash   Recent Results (from the past 2160 hour(s))  Hepatitis C Antibody     Status: None   Collection Time: 11/01/21  7:25 AM  Result Value Ref Range   HCV Ab NON REACTIVE NON REACTIVE    Comment: (NOTE) Nonreactive HCV antibody screen is consistent with no HCV infections,  unless recent infection is suspected or other evidence exists to  indicate HCV infection.  Performed at [facility], [place] [address], [place], [place] [address]   VITAMIN D 25 Hydroxy (Vit-D Deficiency, Fractures)     Status: None   Collection Time: 11/01/21  7:25 AM  Result Value Ref Range   Vit D, 25-Hydroxy 42.31 30 - 100 ng/mL    Comment: (NOTE) Vitamin D deficiency has been defined by the Institute of Medicine  and an Endocrine Society practice guideline as a level of serum 25-OH  vitamin D less than 20 ng/mL (1,2). The Endocrine Society went on to  further define vitamin D insufficiency as a level between 21 and 29  ng/mL (2).  1. IOM (Institute of Medicine). 2010. Dietary reference intakes for  calcium and D. [place]: The [employer]. 2. Holick MF, [place], Bischoff-Ferrari HA, et al. Evaluation,  treatment, and prevention of vitamin D deficiency: an Endocrine  Society clinical practice guideline, JCEM. 2011 Jul; 96(7): 1911-30.  Performed at [facility], [place] [address], [place], [place] [address]   Comprehensive metabolic panel     Status: None   Collection Time: 11/01/21  7:25 AM  Result Value Ref Range   Sodium 138 135 - 145 mmol/L   Potassium 4.2 3.5 - 5.1 mmol/L   Chloride 105 98 - 111 mmol/L   CO2 27 22 - 32 mmol/L   Glucose, Bld 96 70 - 99 mg/dL    Comment: Glucose reference range applies only to samples taken after fasting for at least 8 hours.   BUN 11 6 - 20 mg/dL   Creatinine, Ser 0.99 0.44 - 1.00 mg/dL   Calcium 9.1 8.9 - 10.3 mg/dL   Total Protein 6.7 6.5 - 8.1 g/dL   Albumin 4.0 3.5 - 5.0 g/dL   AST 19 15 - 41  U/L   ALT 16 0 - 44 U/L   Alkaline Phosphatase 55 38 - 126 U/L   Total Bilirubin 0.3 0.3 - 1.2 mg/dL   GFR, Estimated >60 >60 mL/min    Comment: (NOTE) Calculated using the CKD-EPI Creatinine Equation (2021)    Anion gap 6 5 - 15    Comment: Performed at [facility] [address] [place], [place] [address]  CBC with Differential/Platelet     Status: None   Collection Time: 11/01/21  7:25 AM  Result Value Ref Range   WBC 6.0 4.0 - 10.5 K/uL   RBC 4.56 3.87 - 5.11 MIL/uL   Hemoglobin 12.8 12.0 - 15.0 g/dL   HCT 38.2 36.0 - 46.0 %   MCV 83.8 80.0 - 100.0 fL   MCH 28.1 26.0 - 34.0 pg   MCHC 33.5 30.0 - 36.0 g/dL   RDW 12.5 11.5 - 15.5 %   Platelets 287 150 - 400 K/uL   nRBC 0.0 0.0 - 0.2 %   Neutrophils Relative % 52 %   Neutro Abs 3.1 1.7 - 7.7 K/uL   Lymphocytes Relative 37 %   Lymphs Abs 2.2 0.7 - 4.0 K/uL   Monocytes Relative 8 %   Monocytes Absolute 0.5 0.1 - 1.0 K/uL   Eosinophils Relative 2 %   Eosinophils Absolute 0.1 0.0 - 0.5 K/uL   Basophils Relative 1 %   Basophils Absolute 0.1 0.0 - 0.1 K/uL   Immature Granulocytes 0 %   Abs Immature Granulocytes 0.02 0.00 - 0.07 K/uL    Comment: Performed at [facility], [address], [place], [place] [address]  Magnesium  Status: None   Collection Time: 11/02/21  7:35 AM  Result Value Ref Range   Magnesium 2.1 1.7 - 2.4 mg/dL    Comment: Performed at [facility], [place] [address], [place], [place] [address]  Urinalysis, Routine w reflex microscopic     Status: Abnormal   Collection Time: 11/02/21  7:35 AM  Result Value Ref Range   Color, Urine STRAW (A) YELLOW   APPearance CLEAR CLEAR   Specific Gravity, Urine 1.009 1.005 - 1.030   pH 7.0 5.0 - 8.0   Glucose, UA NEGATIVE NEGATIVE mg/dL   Hgb urine dipstick NEGATIVE NEGATIVE   Bilirubin Urine NEGATIVE NEGATIVE   Ketones, ur NEGATIVE NEGATIVE mg/dL   Protein, ur NEGATIVE NEGATIVE mg/dL   Nitrite NEGATIVE NEGATIVE   Leukocytes,Ua NEGATIVE  NEGATIVE    Comment: Performed at [facility] [address] [place], [place] [address]  TSH     Status: None   Collection Time: 11/02/21  7:35 AM  Result Value Ref Range   TSH 2.285 0.350 - 4.500 uIU/mL    Comment: Performed by a 3rd Generation assay with a functional sensitivity of <=0.01 uIU/mL. Performed at [facility], [place] [address], [place], [place] [address]   Lipid panel     Status: None   Collection Time: 11/02/21  7:35 AM  Result Value Ref Range   Cholesterol 137 0 - 200 mg/dL   Triglycerides 70 <150 mg/dL   HDL 47 >40 mg/dL   Total CHOL/HDL Ratio 2.9 RATIO   VLDL 14 0 - 40 mg/dL   LDL Cholesterol 76 0 - 99 mg/dL    Comment:        Total Cholesterol/HDL:CHD Risk Coronary Heart Disease Risk Table                     Men   Women  1/2 Average Risk   3.4   3.3  Average Risk       5.0   4.4  2 X Average Risk   9.6   7.1  3 X Average Risk  23.4   11.0        Use the calculated Patient Ratio above and the CHD Risk Table to determine the patient's CHD Risk.        ATP III CLASSIFICATION (LDL):  <100     mg/dL   Optimal  100-129  mg/dL   Near or Above                    Optimal  130-159  mg/dL   Borderline  160-189  mg/dL   High  >190     mg/dL   Very High Performed at [facility] [address], [place], [place] [address]    Objective  Body mass index is 33.3 kg/m. Wt Readings from Last 3 Encounters:  11/03/21 185 lb 9.6 oz (84.2 kg)  09/30/20 181 lb 3.2 oz (82.2 kg)  09/29/20 188 lb (85.3 kg)   Temp Readings from Last 3 Encounters:  11/03/21 98.5 F (36.9 C) (Oral)  09/30/20 97.6 F (36.4 C) (Oral)  09/29/20 (!) 97.5 F (36.4 C)   BP Readings from Last 3 Encounters:  11/03/21 130/78  09/30/20 126/84  09/29/20 118/67   Pulse Readings from Last 3 Encounters:  11/03/21 (!) 103  09/30/20 95  09/29/20 80    Physical Exam Vitals and nursing note reviewed.  Constitutional:  Appearance: Normal appearance. She is  well-developed and well-groomed.  HENT:     Head: Normocephalic and atraumatic.  Eyes:     Conjunctiva/sclera: Conjunctivae normal.     Pupils: Pupils are equal, round, and reactive to light.  Cardiovascular:     Rate and Rhythm: Normal rate and regular rhythm.     Heart sounds: Normal heart sounds. No murmur heard. Pulmonary:     Effort: Pulmonary effort is normal.     Breath sounds: Normal breath sounds.  Abdominal:     General: Abdomen is flat. Bowel sounds are normal.     Tenderness: There is no abdominal tenderness.  Musculoskeletal:        General: No tenderness.  Skin:    General: Skin is warm and dry.  Neurological:     General: No focal deficit present.     Mental Status: She is alert and oriented to person, place, and time. Mental status is at baseline.     Cranial Nerves: Cranial nerves 2-12 are intact.     Motor: Motor function is intact.     Coordination: Coordination is intact.     Gait: Gait is intact.  Psychiatric:        Attention and Perception: Attention and perception normal.        Mood and Affect: Mood and affect normal.        Speech: Speech normal.        Behavior: Behavior normal. Behavior is cooperative.        Thought Content: Thought content normal.        Cognition and Memory: Cognition and memory normal.        Judgment: Judgment normal.     Assessment  Plan  Annual physical exam See below  Insomnia, unspecified type - Plan: eszopiclone (LUNESTA) 2 MG TABS tablet Anxiety - Plan: ALPRAZolam (XANAX) 0.25 MG tablet  Depression, recurrent ([place]) - Plan: sertraline (ZOLOFT) 100 MG tablet, buPROPion (WELLBUTRIN XL) 150 MG 24 hr tablet  Allergic rhinitis, unspecified seasonality, unspecified trigger  Acne vulgaris - Plan: tretinoin (RETIN-A) 0.05 % cream  F/u derm  HM Flu shot will get at work 2023 covid shot declines   Tdap utd  Declines HPV vx Hep B immune  MMR immune   Pap had 02/23/16 neg no h/o abnormal pap except 1991 possibly  related to OCP use but resolved per pt due for repeat in 02/23/2019   -saw [facility] ob[facility] 10/01/18 for endometriosis Dr. Laddie Aquas refilled norethindrone 0.35 qd for endometrosis no pap done pelvic performed 174 wt doing some cardio as of 10/10/2018  As of 08/19/19 no cycles due to ablation x 18 years Pap  Mammo  -had 02/2021 get records [facility] today    EGD/Colonoscopy utd 09/29/20 see in chart repeat in 7 years   Obtained notes from [facility] rheumatology and arthritis center (Dr. Scarlette Shorts) now f/u rheumatology [place]/Dr. Sherilyn Dacosta dermatology near [place] in [place] in 2019 as of 09/30/20 no need for dermatology   Cont healthy diet and exercise       Provider: Dr. Olivia Mackie McLean-Scocuzza-Internal Medicine

## 2021-11-07 ENCOUNTER — Telehealth: Payer: Self-pay | Admitting: Internal Medicine

## 2021-11-07 NOTE — Telephone Encounter (Signed)
Dr. Billey Gosling [facility]    Phone Fax E-mail Address  [phone] Not available [place] [address]       Specialties        Internal Medicine     Inform pt above provider agreed to be her new doctor please call for appt before 6 months to transfer care .

## 2021-11-07 NOTE — Progress Notes (Signed)
Thank you Dr. Georjean Mode  Advise pt Dr. Billey Gosling will accept her as transfer of care in [facility]  This is info for pt to call and schedule appt in 6 months or less with her MD Physician   Primary Contact Information  Phone Fax E-mail Address  [phone] Not available [place] [address]     Specialties     Internal Medicine

## 2021-11-09 ENCOUNTER — Encounter: Payer: Self-pay | Admitting: Internal Medicine

## 2021-11-17 ENCOUNTER — Encounter: Payer: Self-pay | Admitting: Internal Medicine

## 2021-11-18 ENCOUNTER — Other Ambulatory Visit: Payer: Self-pay | Admitting: Internal Medicine

## 2021-11-18 DIAGNOSIS — L700 Acne vulgaris: Secondary | ICD-10-CM

## 2021-11-18 MED ORDER — SPIRONOLACTONE 100 MG PO TABS: 50.0000 mg | ORAL_TABLET | Freq: Every day | ORAL | 1 refills | Status: DC

## 2021-11-18 NOTE — Telephone Encounter (Signed)
Last ov 11/03/21 okay to prescribe Spironolactone for acne?

## 2021-11-21 ENCOUNTER — Telehealth: Payer: Self-pay

## 2021-11-21 NOTE — Telephone Encounter (Signed)
PA was started but not needed for pts [place] cream it is already covered:   Cathy Guerrero  Key: [id number] Rx #: [phone] Outcome: Additional Information Required Drug is covered by current benefit plan. No further PA activity needed Drug:Tretinoin 0.05% cream Form [employer] Electronic PA Form (2017 NCPDP) Original Claim Info 21

## 2022-02-20 NOTE — Patient Instructions (Signed)
     It was nice to meet you.    Medications changes include :   doxycyline 100 mg twice daily     Return in about 9 months (around 11/23/2022) for Physical Exam.

## 2022-02-20 NOTE — Progress Notes (Unsigned)
Subjective:    Patient ID: Cathy Guerrero, female    DOB: 1975-07-25, 46 y.o.   MRN: [id number]     HPI Cathy Guerrero is here to establish with a new pcp and is here for follow up of her chronic medical problems, including htn, GERD, anxiety, depression,insomnia  No labs  ? 6 mo or 1 yr  Medications and allergies reviewed with patient and updated if appropriate.  Current Outpatient Medications on File Prior to Visit  Medication Sig Dispense Refill   ALPRAZolam (XANAX) 0.25 MG tablet TAKE 1 TABLET(0.25 MG) BY MOUTH DAILY AS NEEDED FOR ANXIETY 30 tablet 5   Biotin 5000 MCG CAPS Take 5,000 mcg by mouth daily.  (Patient not taking: Reported on 11/03/2021)     buPROPion (WELLBUTRIN XL) 150 MG 24 hr tablet Take 1 tablet (150 mg total) by mouth daily. 90 tablet 3   CALCIUM CARBONATE-VITAMIN D PO Take 1 tablet by mouth daily.     Cetirizine HCl 10 MG TBDP Take 1 tablet by mouth at bedtime as needed. 90 tablet 3   eszopiclone (LUNESTA) 2 MG TABS tablet TAKE 1 TABLET(2 MG) BY MOUTH AT BEDTIME AS NEEDED FOR SLEEP. TAKE IMMEDIATELY BEFORE BEDTIME 30 tablet 5   Magnesium 250 MG TABS Take 250 mg by mouth daily.      Multiple Vitamin (MULTIVITAMIN) capsule Take 1 capsule by mouth daily.     norethindrone (MICRONOR,CAMILA,ERRIN) 0.35 MG tablet Take 1 tablet by mouth daily.      pantoprazole (PROTONIX) 40 MG tablet TAKE 1 TABLET DAILY BEFORE SUPPER 30 MINUTES BEFORE A MEAL (STOP PRILOSEC 20 MG) 90 tablet 3   sertraline (ZOLOFT) 100 MG tablet TAKE 1 TABLET DAILY (DOSE INCREASE) 90 tablet 3   spironolactone (ALDACTONE) 100 MG tablet Take 0.5 tablets (50 mg total) by mouth daily. In the amx 2 weeks then increase to 100 mg qd and f/u dermatology further refills 90 tablet 1   tretinoin (RETIN-A) 0.05 % cream Apply 1 application  topically daily as needed (for face). 45 g 11   valACYclovir (VALTREX) 500 MG tablet Take 1 tablet (500 mg total) by mouth 2 (two) times daily. X 3-7 days prn outbreak 60 tablet  11   No current facility-administered medications on file prior to visit.     Review of Systems     Objective:  There were no vitals filed for this visit. BP Readings from Last 3 Encounters:  11/03/21 130/78  09/30/20 126/84  09/29/20 118/67   Wt Readings from Last 3 Encounters:  11/03/21 185 lb 9.6 oz (84.2 kg)  09/30/20 181 lb 3.2 oz (82.2 kg)  09/29/20 188 lb (85.3 kg)   There is no height or weight on file to calculate BMI.    Physical Exam     Lab Results  Component Value Date   WBC 6.0 11/01/2021   HGB 12.8 11/01/2021   HCT 38.2 11/01/2021   PLT 287 11/01/2021   GLUCOSE 96 11/01/2021   CHOL 137 11/02/2021   TRIG 70 11/02/2021   HDL 47 11/02/2021   LDLCALC 76 11/02/2021   ALT 16 11/01/2021   AST 19 11/01/2021   NA 138 11/01/2021   K 4.2 11/01/2021   CL 105 11/01/2021   CREATININE 0.99 11/01/2021   BUN 11 11/01/2021   CO2 27 11/01/2021   TSH 2.285 11/02/2021   INR 1.1 08/25/2013   HGBA1C 5.2 10/02/2018     Assessment & Plan:    See Problem  List for Assessment and Plan of chronic medical problems.

## 2022-02-21 ENCOUNTER — Encounter: Payer: Self-pay | Admitting: Internal Medicine

## 2022-02-21 ENCOUNTER — Ambulatory Visit (INDEPENDENT_AMBULATORY_CARE_PROVIDER_SITE_OTHER): Payer: BC Managed Care – PPO | Admitting: Internal Medicine

## 2022-02-21 VITALS — BP 120/80 | HR 82 | Temp 98.2°F | Ht 62.6 in | Wt 179.0 lb

## 2022-02-21 DIAGNOSIS — L700 Acne vulgaris: Secondary | ICD-10-CM

## 2022-02-21 DIAGNOSIS — F339 Major depressive disorder, recurrent, unspecified: Secondary | ICD-10-CM

## 2022-02-21 DIAGNOSIS — F419 Anxiety disorder, unspecified: Secondary | ICD-10-CM | POA: Diagnosis not present

## 2022-02-21 DIAGNOSIS — J0100 Acute maxillary sinusitis, unspecified: Secondary | ICD-10-CM

## 2022-02-21 DIAGNOSIS — K219 Gastro-esophageal reflux disease without esophagitis: Secondary | ICD-10-CM

## 2022-02-21 DIAGNOSIS — I471 Supraventricular tachycardia, unspecified: Secondary | ICD-10-CM

## 2022-02-21 DIAGNOSIS — I10 Essential (primary) hypertension: Secondary | ICD-10-CM | POA: Diagnosis not present

## 2022-02-21 DIAGNOSIS — J309 Allergic rhinitis, unspecified: Secondary | ICD-10-CM

## 2022-02-21 DIAGNOSIS — J019 Acute sinusitis, unspecified: Secondary | ICD-10-CM | POA: Insufficient documentation

## 2022-02-21 DIAGNOSIS — G4700 Insomnia, unspecified: Secondary | ICD-10-CM

## 2022-02-21 MED ORDER — DOXYCYCLINE HYCLATE 100 MG PO TABS: 100.0000 mg | ORAL_TABLET | Freq: Two times a day (BID) | ORAL | 0 refills | Status: AC

## 2022-02-21 MED ORDER — SPIRONOLACTONE 100 MG PO TABS: 100.0000 mg | ORAL_TABLET | Freq: Every day | ORAL | 3 refills | Status: DC

## 2022-02-21 NOTE — Assessment & Plan Note (Signed)
Chronic Taking spironolactone 100 mg daily

## 2022-02-21 NOTE — Assessment & Plan Note (Addendum)
Chronic Follows with Dr Henrene Pastor Taking pantoprazole 40 mg daily

## 2022-02-21 NOTE — Assessment & Plan Note (Signed)
Chronic Blood pressure well controlled On spironolactone 100 mg daily-this is more for her acne, but likely contributing to well-controlled blood pressure Continue lactone 100 mg daily

## 2022-02-21 NOTE — Assessment & Plan Note (Signed)
Chronic Overall controlled Continue sertraline 100 mg daily, bupropion XL 50 mg daily

## 2022-02-21 NOTE — Assessment & Plan Note (Signed)
Acute ?Likely bacterial  ?Start doxycycline 100 mg BID x 10 day ?otc cold medications ?Rest, fluid ?Call if no improvement ? ?

## 2022-02-21 NOTE — Assessment & Plan Note (Signed)
S/p cardiac ablation No issues

## 2022-02-21 NOTE — Assessment & Plan Note (Addendum)
Chronic  seasonal Taking over-the-counter medication as needed

## 2022-02-21 NOTE — Assessment & Plan Note (Signed)
Chronic Overall controlled Continue sertraline 100 mg daily, bupropion XL 50 mg daily, alprazolam 0.25 mg as needed

## 2022-02-21 NOTE — Assessment & Plan Note (Addendum)
Improved As needed xanax-does not take often Has not needed Lunesta for a long time Continue as needed medication

## 2022-05-30 ENCOUNTER — Encounter: Payer: Self-pay | Admitting: Internal Medicine

## 2022-05-30 ENCOUNTER — Other Ambulatory Visit: Payer: Self-pay

## 2022-05-30 DIAGNOSIS — F419 Anxiety disorder, unspecified: Secondary | ICD-10-CM

## 2022-05-31 MED ORDER — ALPRAZOLAM 0.25 MG PO TABS: ORAL_TABLET | ORAL | 5 refills | Status: DC

## 2022-06-16 DIAGNOSIS — H5213 Myopia, bilateral: Secondary | ICD-10-CM | POA: Diagnosis not present

## 2022-06-16 DIAGNOSIS — H40013 Open angle with borderline findings, low risk, bilateral: Secondary | ICD-10-CM | POA: Diagnosis not present

## 2022-08-09 ENCOUNTER — Encounter: Payer: Self-pay | Admitting: Internal Medicine

## 2022-08-09 DIAGNOSIS — F419 Anxiety disorder, unspecified: Secondary | ICD-10-CM

## 2022-08-09 MED ORDER — ALPRAZOLAM 0.25 MG PO TABS: ORAL_TABLET | ORAL | 5 refills | Status: DC

## 2022-10-26 ENCOUNTER — Encounter (INDEPENDENT_AMBULATORY_CARE_PROVIDER_SITE_OTHER): Payer: Self-pay

## 2022-11-17 ENCOUNTER — Telehealth: Payer: Self-pay | Admitting: Internal Medicine

## 2022-11-17 DIAGNOSIS — Z0000 Encounter for general adult medical examination without abnormal findings: Secondary | ICD-10-CM

## 2022-11-17 DIAGNOSIS — I10 Essential (primary) hypertension: Secondary | ICD-10-CM

## 2022-11-17 DIAGNOSIS — F339 Major depressive disorder, recurrent, unspecified: Secondary | ICD-10-CM

## 2022-11-17 DIAGNOSIS — F419 Anxiety disorder, unspecified: Secondary | ICD-10-CM

## 2022-11-17 NOTE — Telephone Encounter (Signed)
Pt has CPE scheduled 9/13 and lab appt 9/11.  Please enter orders for the lab appt.

## 2022-11-22 ENCOUNTER — Other Ambulatory Visit: Payer: BC Managed Care – PPO

## 2022-11-24 ENCOUNTER — Encounter: Payer: BC Managed Care – PPO | Admitting: Internal Medicine

## 2022-12-14 ENCOUNTER — Other Ambulatory Visit (INDEPENDENT_AMBULATORY_CARE_PROVIDER_SITE_OTHER): Payer: BC Managed Care – PPO

## 2022-12-14 DIAGNOSIS — F419 Anxiety disorder, unspecified: Secondary | ICD-10-CM

## 2022-12-14 DIAGNOSIS — I10 Essential (primary) hypertension: Secondary | ICD-10-CM | POA: Diagnosis not present

## 2022-12-14 LAB — COMPREHENSIVE METABOLIC PANEL
ALT: 11 U/L (ref 0–35)
AST: 14 U/L (ref 0–37)
Albumin: 4.2 g/dL (ref 3.5–5.2)
Alkaline Phosphatase: 49 U/L (ref 39–117)
BUN: 12 mg/dL (ref 6–23)
CO2: 27 meq/L (ref 19–32)
Calcium: 9.2 mg/dL (ref 8.4–10.5)
Chloride: 105 meq/L (ref 96–112)
Creatinine, Ser: 0.96 mg/dL (ref 0.40–1.20)
GFR: 69.84 mL/min (ref >60.00)
Glucose, Bld: 84 mg/dL (ref 70–99)
Potassium: 3.9 meq/L (ref 3.5–5.1)
Sodium: 138 meq/L (ref 135–145)
Total Bilirubin: 0.5 mg/dL (ref 0.2–1.2)
Total Protein: 6.9 g/dL (ref 6.0–8.3)

## 2022-12-14 LAB — CBC WITH DIFFERENTIAL/PLATELET
Basophils Absolute: 0 10*3/uL (ref 0.0–0.1)
Basophils Relative: 0.5 % (ref 0.0–3.0)
Eosinophils Absolute: 0.2 10*3/uL (ref 0.0–0.7)
Eosinophils Relative: 2 % (ref 0.0–5.0)
HCT: 40 % (ref 36.0–46.0)
Hemoglobin: 13.1 g/dL (ref 12.0–15.0)
Lymphocytes Relative: 35.7 % (ref 12.0–46.0)
Lymphs Abs: 2.7 10*3/uL (ref 0.7–4.0)
MCHC: 32.8 g/dL (ref 30.0–36.0)
MCV: 85.9 fL (ref 78.0–100.0)
Monocytes Absolute: 0.4 10*3/uL (ref 0.1–1.0)
Monocytes Relative: 5.5 % (ref 3.0–12.0)
Neutro Abs: 4.3 10*3/uL (ref 1.4–7.7)
Neutrophils Relative %: 56.3 % (ref 43.0–77.0)
Platelets: 307 10*3/uL (ref 150.0–400.0)
RBC: 4.66 Mil/uL (ref 3.87–5.11)
RDW: 13.8 % (ref 11.5–15.5)
WBC: 7.7 10*3/uL (ref 4.0–10.5)

## 2022-12-14 LAB — LIPID PANEL
Cholesterol: 122 mg/dL (ref 0–200)
HDL: 47.1 mg/dL (ref >39.00)
LDL Cholesterol: 57 mg/dL (ref 0–99)
NonHDL: 74.64
Total CHOL/HDL Ratio: 3
Triglycerides: 88 mg/dL (ref 0.0–149.0)
VLDL: 17.6 mg/dL (ref 0.0–40.0)

## 2022-12-14 LAB — TSH: TSH: 1.67 u[IU]/mL (ref 0.35–5.50)

## 2022-12-18 ENCOUNTER — Encounter: Payer: Self-pay | Admitting: Internal Medicine

## 2022-12-18 NOTE — Patient Instructions (Addendum)
Medications changes include :   increase sertraline 150  mg daily     Return in about 6 months (around 06/19/2023) for follow up.    Health Maintenance, Female Adopting a healthy lifestyle and getting preventive care are important in promoting health and wellness. Ask your health care provider about: The right schedule for you to have regular tests and exams. Things you can do on your own to prevent diseases and keep yourself healthy. What should I know about diet, weight, and exercise? Eat a healthy diet  Eat a diet that includes plenty of vegetables, fruits, low-fat dairy products, and lean protein. Do not eat a lot of foods that are high in solid fats, added sugars, or sodium. Maintain a healthy weight Body mass index (BMI) is used to identify weight problems. It estimates body fat based on height and weight. Your health care provider can help determine your BMI and help you achieve or maintain a healthy weight. Get regular exercise Get regular exercise. This is one of the most important things you can do for your health. Most adults should: Exercise for at least 150 minutes each week. The exercise should increase your heart rate and make you sweat (moderate-intensity exercise). Do strengthening exercises at least twice a week. This is in addition to the moderate-intensity exercise. Spend less time sitting. Even light physical activity can be beneficial. Watch cholesterol and blood lipids Have your blood tested for lipids and cholesterol at 48 years of age, then have this test every 5 years. Have your cholesterol levels checked more often if: Your lipid or cholesterol levels are high. You are older than 48 years of age. You are at high risk for heart disease. What should I know about cancer screening? Depending on your health history and family history, you may need to have cancer screening at various ages. This may include screening for: Breast cancer. Cervical  cancer. Colorectal cancer. Skin cancer. Lung cancer. What should I know about heart disease, diabetes, and high blood pressure? Blood pressure and heart disease High blood pressure causes heart disease and increases the risk of stroke. This is more likely to develop in people who have high blood pressure readings or are overweight. Have your blood pressure checked: Every 3-5 years if you are 48-78 years of age. Every year if you are 67 years old or older. Diabetes Have regular diabetes screenings. This checks your fasting blood sugar level. Have the screening done: Once every three years after age 30 if you are at a normal weight and have a low risk for diabetes. More often and at a younger age if you are overweight or have a high risk for diabetes. What should I know about preventing infection? Hepatitis B If you have a higher risk for hepatitis B, you should be screened for this virus. Talk with your health care provider to find out if you are at risk for hepatitis B infection. Hepatitis C Testing is recommended for: Everyone born from [birth year] through 1966. Anyone with known risk factors for hepatitis C. Sexually transmitted infections (STIs) Get screened for STIs, including gonorrhea and chlamydia, if: You are sexually active and are younger than 48 years of age. You are older than 48 years of age and your health care provider tells you that you are at risk for this type of infection. Your sexual activity has changed since you were last screened, and you are at increased risk for chlamydia or gonorrhea. Ask your health care  provider if you are at risk. Ask your health care provider about whether you are at high risk for HIV. Your health care provider may recommend a prescription medicine to help prevent HIV infection. If you choose to take medicine to prevent HIV, you should first get tested for HIV. You should then be tested every 3 months for as long as you are taking the  medicine. Pregnancy If you are about to stop having your period (premenopausal) and you may become pregnant, seek counseling before you get pregnant. Take 400 to 800 micrograms (mcg) of folic acid every day if you become pregnant. Ask for birth control (contraception) if you want to prevent pregnancy. Osteoporosis and menopause Osteoporosis is a disease in which the bones lose minerals and strength with aging. This can result in bone fractures. If you are 32 years old or older, or if you are at risk for osteoporosis and fractures, ask your health care provider if you should: Be screened for bone loss. Take a calcium or vitamin D supplement to lower your risk of fractures. Be given hormone replacement therapy (HRT) to treat symptoms of menopause. Follow these instructions at home: Alcohol use Do not drink alcohol if: Your health care provider tells you not to drink. You are pregnant, may be pregnant, or are planning to become pregnant. If you drink alcohol: Limit how much you have to: 0-1 drink a day. Know how much alcohol is in your drink. In the [place] one drink equals one 12 oz bottle of beer (355 mL), one 5 oz glass of wine (148 mL), or one 1 oz glass of hard liquor (44 mL). Lifestyle Do not use any products that contain nicotine or tobacco. These products include cigarettes, chewing tobacco, and vaping devices, such as e-cigarettes. If you need help quitting, ask your health care provider. Do not use street drugs. Do not share needles. Ask your health care provider for help if you need support or information about quitting drugs. General instructions Schedule regular health, dental, and eye exams. Stay current with your vaccines. Tell your health care provider if: You often feel depressed. You have ever been abused or do not feel safe at home. Summary Adopting a healthy lifestyle and getting preventive care are important in promoting health and wellness. Follow your health care  provider's instructions about healthy diet, exercising, and getting tested or screened for diseases. Follow your health care provider's instructions on monitoring your cholesterol and blood pressure. This information is not intended to replace advice given to you by your health care provider. Make sure you discuss any questions you have with your health care provider. Document Revised: 07/19/2020 Document Reviewed: 07/19/2020 Elsevier Patient Education  2024 [employer].

## 2022-12-18 NOTE — Progress Notes (Unsigned)
Subjective:    Patient ID: Cathy Guerrero, female    DOB: 1975-06-15, 47 y.o.   MRN: [id number]      HPI Cathy Guerrero is here for a Physical exam and her chronic medical problems.   Had labs done.     Medications and allergies reviewed with patient and updated if appropriate.  Current Outpatient Medications on File Prior to Visit  Medication Sig Dispense Refill   ALPRAZolam (XANAX) 0.25 MG tablet TAKE 1 TABLET(0.25 MG) BY MOUTH DAILY AS NEEDED FOR ANXIETY 30 tablet 5   buPROPion (WELLBUTRIN XL) 150 MG 24 hr tablet Take 1 tablet (150 mg total) by mouth daily. 90 tablet 3   CALCIUM CARBONATE-VITAMIN D PO Take 1 tablet by mouth daily.     Cetirizine HCl 10 MG TBDP Take 1 tablet by mouth at bedtime as needed. 90 tablet 3   eszopiclone (LUNESTA) 2 MG TABS tablet TAKE 1 TABLET(2 MG) BY MOUTH AT BEDTIME AS NEEDED FOR SLEEP. TAKE IMMEDIATELY BEFORE BEDTIME 30 tablet 5   Magnesium 250 MG TABS Take 250 mg by mouth daily.      Multiple Vitamin (MULTIVITAMIN) capsule Take 1 capsule by mouth daily.     norethindrone (MICRONOR,CAMILA,ERRIN) 0.35 MG tablet Take 1 tablet by mouth daily.      pantoprazole (PROTONIX) 40 MG tablet TAKE 1 TABLET DAILY BEFORE SUPPER 30 MINUTES BEFORE A MEAL (STOP PRILOSEC 20 MG) 90 tablet 3   sertraline (ZOLOFT) 100 MG tablet TAKE 1 TABLET DAILY (DOSE INCREASE) 90 tablet 3   spironolactone (ALDACTONE) 100 MG tablet Take 1 tablet (100 mg total) by mouth daily. 90 tablet 3   tretinoin (RETIN-A) 0.05 % cream Apply 1 application  topically daily as needed (for face). 45 g 11   valACYclovir (VALTREX) 500 MG tablet Take 1 tablet (500 mg total) by mouth 2 (two) times daily. X 3-7 days prn outbreak 60 tablet 11   No current facility-administered medications on file prior to visit.    Review of Systems     Objective:  There were no vitals filed for this visit. There were no vitals filed for this visit. There is no height or weight on file to calculate BMI.  BP Readings  from Last 3 Encounters:  02/21/22 120/80  11/03/21 130/78  09/30/20 126/84    Wt Readings from Last 3 Encounters:  02/21/22 179 lb (81.2 kg)  11/03/21 185 lb 9.6 oz (84.2 kg)  09/30/20 181 lb 3.2 oz (82.2 kg)       Physical Exam Constitutional: She appears well-developed and well-nourished. No distress.  HENT:  Head: Normocephalic and atraumatic.  Right Ear: External ear normal. Normal ear canal and TM Left Ear: External ear normal.  Normal ear canal and TM Mouth/Throat: Oropharynx is clear and moist.  Eyes: Conjunctivae normal.  Neck: Neck supple. No tracheal deviation present. No thyromegaly present.  No carotid bruit  Cardiovascular: Normal rate, regular rhythm and normal heart sounds.   No murmur heard.  No edema. Pulmonary/Chest: Effort normal and breath sounds normal. No respiratory distress. She has no wheezes. She has no rales.  Breast: deferred   Abdominal: Soft. She exhibits no distension. There is no tenderness.  Lymphadenopathy: She has no cervical adenopathy.  Skin: Skin is warm and dry. She is not diaphoretic.  Psychiatric: She has a normal mood and affect. Her behavior is normal.     Lab Results  Component Value Date   WBC 7.7 12/14/2022   HGB 13.1 12/14/2022  HCT 40.0 12/14/2022   PLT 307.0 12/14/2022   GLUCOSE 84 12/14/2022   CHOL 122 12/14/2022   TRIG 88.0 12/14/2022   HDL 47.10 12/14/2022   LDLCALC 57 12/14/2022   ALT 11 12/14/2022   AST 14 12/14/2022   NA 138 12/14/2022   K 3.9 12/14/2022   CL 105 12/14/2022   CREATININE 0.96 12/14/2022   BUN 12 12/14/2022   CO2 27 12/14/2022   TSH 1.67 12/14/2022   INR 1.1 08/25/2013   HGBA1C 5.2 10/02/2018         Assessment & Plan:   Physical exam: Screening blood work  ordered Exercise   Weight   Substance abuse  none   Reviewed recommended immunizations.   Health Maintenance  Topic Date Due   HIV Screening  Never done   Cervical Cancer Screening (HPV/Pap Cotest)  03/10/2019    MAMMOGRAM  12/23/2021   INFLUENZA VACCINE  10/12/2022   DTaP/Tdap/Td (2 - Td or Tdap) 07/25/2027   Colonoscopy  09/30/2027   Hepatitis C Screening  Completed   HPV VACCINES  Aged Out   COVID-19 Vaccine  Discontinued          See Problem List for Assessment and Plan of chronic medical problems.

## 2022-12-19 ENCOUNTER — Ambulatory Visit (INDEPENDENT_AMBULATORY_CARE_PROVIDER_SITE_OTHER): Payer: BC Managed Care – PPO | Admitting: Internal Medicine

## 2022-12-19 VITALS — BP 106/78 | HR 76 | Temp 98.6°F | Ht 62.6 in | Wt 164.0 lb

## 2022-12-19 DIAGNOSIS — L700 Acne vulgaris: Secondary | ICD-10-CM

## 2022-12-19 DIAGNOSIS — F419 Anxiety disorder, unspecified: Secondary | ICD-10-CM | POA: Diagnosis not present

## 2022-12-19 DIAGNOSIS — F339 Major depressive disorder, recurrent, unspecified: Secondary | ICD-10-CM | POA: Diagnosis not present

## 2022-12-19 DIAGNOSIS — H409 Unspecified glaucoma: Secondary | ICD-10-CM

## 2022-12-19 DIAGNOSIS — G4700 Insomnia, unspecified: Secondary | ICD-10-CM

## 2022-12-19 DIAGNOSIS — Z0000 Encounter for general adult medical examination without abnormal findings: Secondary | ICD-10-CM

## 2022-12-19 DIAGNOSIS — I10 Essential (primary) hypertension: Secondary | ICD-10-CM | POA: Diagnosis not present

## 2022-12-19 DIAGNOSIS — K219 Gastro-esophageal reflux disease without esophagitis: Secondary | ICD-10-CM | POA: Diagnosis not present

## 2022-12-19 MED ORDER — BUPROPION HCL ER (XL) 150 MG PO TB24
150.0000 mg | ORAL_TABLET | Freq: Every day | ORAL | 3 refills | Status: DC
Start: 2022-12-19 — End: 2023-03-19

## 2022-12-19 MED ORDER — SERTRALINE HCL 100 MG PO TABS
150.0000 mg | ORAL_TABLET | Freq: Every day | ORAL | 3 refills | Status: DC
Start: 2022-12-19 — End: 2023-03-19

## 2022-12-19 NOTE — Assessment & Plan Note (Addendum)
Improved - controlled As needed xanax-does not take often Has not taken Lunesta for a long time-will discontinue Continue as needed medication

## 2022-12-19 NOTE — Assessment & Plan Note (Addendum)
Chronic GERD controlled Taking pantoprazole 40 mg daily

## 2022-12-19 NOTE — Assessment & Plan Note (Signed)
Chronic Taking spironolactone 100 mg daily

## 2022-12-19 NOTE — Assessment & Plan Note (Signed)
Chronic Blood pressure well controlled CBC, CMP reviewed On spironolactone 100 mg daily-this is more for her acne, but likely contributing to well-controlled blood pressure Continue aldactone 100 mg daily

## 2022-12-19 NOTE — Assessment & Plan Note (Signed)
Following with ophthalmology Using eyedrops

## 2022-12-19 NOTE — Assessment & Plan Note (Addendum)
Chronic Not ideally controlled Experiencing increased anxiety and irritability She does have a lot of stress in her life between work and family Discussed hormones could also be coming to play Will increase sertraline to 150 mg daily Continue bupropion XL 150 mg daily, alprazolam 0.25 mg as needed

## 2022-12-19 NOTE — Assessment & Plan Note (Addendum)
Chronic Overall controlled, but anxiety is not ideally controlled so we will be increasing sertraline to 150 mg daily Continue bupropion XL 150 mg daily

## 2023-02-19 ENCOUNTER — Encounter: Payer: Self-pay | Admitting: Internal Medicine

## 2023-02-19 DIAGNOSIS — F419 Anxiety disorder, unspecified: Secondary | ICD-10-CM

## 2023-02-21 ENCOUNTER — Other Ambulatory Visit: Payer: Self-pay

## 2023-02-21 MED ORDER — PANTOPRAZOLE SODIUM 40 MG PO TBEC: DELAYED_RELEASE_TABLET | ORAL | 3 refills | Status: DC

## 2023-02-21 MED ORDER — ALPRAZOLAM 0.25 MG PO TABS
ORAL_TABLET | ORAL | 5 refills | Status: DC
Start: 2023-02-21 — End: 2023-03-19

## 2023-02-21 MED ORDER — SPIRONOLACTONE 100 MG PO TABS: 100.0000 mg | ORAL_TABLET | Freq: Every day | ORAL | 3 refills | Status: DC

## 2023-02-26 ENCOUNTER — Other Ambulatory Visit: Payer: Self-pay | Admitting: Internal Medicine

## 2023-03-19 ENCOUNTER — Other Ambulatory Visit: Payer: Self-pay

## 2023-03-19 ENCOUNTER — Encounter: Payer: Self-pay | Admitting: Internal Medicine

## 2023-03-19 DIAGNOSIS — F419 Anxiety disorder, unspecified: Secondary | ICD-10-CM

## 2023-03-19 DIAGNOSIS — F339 Major depressive disorder, recurrent, unspecified: Secondary | ICD-10-CM

## 2023-03-19 MED ORDER — SERTRALINE HCL 100 MG PO TABS: 150.0000 mg | ORAL_TABLET | Freq: Every day | ORAL | 3 refills | Status: DC

## 2023-03-19 MED ORDER — ALPRAZOLAM 0.25 MG PO TABS: ORAL_TABLET | ORAL | 5 refills | Status: DC

## 2023-03-19 MED ORDER — PANTOPRAZOLE SODIUM 40 MG PO TBEC: DELAYED_RELEASE_TABLET | ORAL | 3 refills | Status: DC

## 2023-03-19 MED ORDER — SPIRONOLACTONE 100 MG PO TABS: 100.0000 mg | ORAL_TABLET | Freq: Every day | ORAL | 3 refills | Status: AC

## 2023-03-19 MED ORDER — BUPROPION HCL ER (XL) 150 MG PO TB24: 150.0000 mg | ORAL_TABLET | Freq: Every day | ORAL | 3 refills | Status: AC

## 2023-06-18 ENCOUNTER — Encounter: Payer: Self-pay | Admitting: Internal Medicine

## 2023-06-18 NOTE — Progress Notes (Unsigned)
 Subjective:    Patient ID: Cathy Guerrero, female    DOB: 12-Sep-1975, 48 y.o.   MRN: [id number]     HPI Cathy Guerrero is here for follow up of her chronic medical problems.  Overall doing well-no concerns  She is looking for a new gynecologist-I will go ahead and refill her birth control and she will establish with someone covered by her insurance  Medications and allergies reviewed with patient and updated if appropriate.  Current Outpatient Medications on File Prior to Visit  Medication Sig Dispense Refill   ALPRAZolam (XANAX) 0.25 MG tablet TAKE 1 TABLET(0.25 MG) BY MOUTH DAILY AS NEEDED FOR ANXIETY 30 tablet 5   buPROPion (WELLBUTRIN XL) 150 MG 24 hr tablet Take 1 tablet (150 mg total) by mouth daily. 90 tablet 3   CALCIUM CARBONATE-VITAMIN D PO Take 1 tablet by mouth daily.     Cetirizine HCl 10 MG TBDP Take 1 tablet by mouth at bedtime as needed. 90 tablet 3   Latanoprost PF 0.005 % SOLN Apply to eye.     Magnesium 250 MG TABS Take 250 mg by mouth daily.      Multiple Vitamin (MULTIVITAMIN) capsule Take 1 capsule by mouth daily.     norethindrone (MICRONOR,CAMILA,ERRIN) 0.35 MG tablet Take 1 tablet by mouth daily.      pantoprazole (PROTONIX) 40 MG tablet TAKE 1 TABLET DAILY BEFORE SUPPER 30 MINUTES BEFORE A MEAL (STOP PRILOSEC 20 MG) 90 tablet 3   sertraline (ZOLOFT) 100 MG tablet Take 1.5 tablets (150 mg total) by mouth daily. 135 tablet 3   spironolactone (ALDACTONE) 100 MG tablet Take 1 tablet (100 mg total) by mouth daily. 90 tablet 3   tretinoin (RETIN-A) 0.05 % cream Apply 1 application  topically daily as needed (for face). 45 g 11   valACYclovir (VALTREX) 500 MG tablet Take 1 tablet (500 mg total) by mouth 2 (two) times daily. X 3-7 days prn outbreak 60 tablet 11   No current facility-administered medications on file prior to visit.     Review of Systems  Constitutional:  Negative for fever.  Respiratory:  Negative for cough, shortness of breath and wheezing.    Cardiovascular:  Negative for chest pain, palpitations and leg swelling.  Neurological:  Positive for headaches (occ). Negative for light-headedness.       Objective:   Vitals:   06/19/23 1540  BP: 106/70  Pulse: 88  Temp: 98.2 F (36.8 C)  SpO2: 98%   BP Readings from Last 3 Encounters:  06/19/23 106/70  12/19/22 106/78  02/21/22 120/80   Wt Readings from Last 3 Encounters:  06/19/23 153 lb (69.4 kg)  12/19/22 164 lb (74.4 kg)  02/21/22 179 lb (81.2 kg)   Body mass index is 27.45 kg/m.    Physical Exam Constitutional:      General: She is not in acute distress.    Appearance: Normal appearance.  HENT:     Head: Normocephalic and atraumatic.  Eyes:     Conjunctiva/sclera: Conjunctivae normal.  Cardiovascular:     Rate and Rhythm: Normal rate and regular rhythm.     Heart sounds: Normal heart sounds.  Pulmonary:     Effort: Pulmonary effort is normal. No respiratory distress.     Breath sounds: Normal breath sounds. No wheezing.  Musculoskeletal:     Cervical back: Neck supple.     Right lower leg: No edema.     Left lower leg: No edema.  Lymphadenopathy:  Cervical: No cervical adenopathy.  Skin:    General: Skin is warm and dry.     Findings: No rash.  Neurological:     Mental Status: She is alert. Mental status is at baseline.  Psychiatric:        Mood and Affect: Mood normal.        Behavior: Behavior normal.        Lab Results  Component Value Date   WBC 7.7 12/14/2022   HGB 13.1 12/14/2022   HCT 40.0 12/14/2022   PLT 307.0 12/14/2022   GLUCOSE 84 12/14/2022   CHOL 122 12/14/2022   TRIG 88.0 12/14/2022   HDL 47.10 12/14/2022   LDLCALC 57 12/14/2022   ALT 11 12/14/2022   AST 14 12/14/2022   NA 138 12/14/2022   K 3.9 12/14/2022   CL 105 12/14/2022   CREATININE 0.96 12/14/2022   BUN 12 12/14/2022   CO2 27 12/14/2022   TSH 1.67 12/14/2022   INR 1.1 08/25/2013   HGBA1C 5.2 10/02/2018     Assessment & Plan:    See Problem List  for Assessment and Plan of chronic medical problems.

## 2023-06-18 NOTE — Patient Instructions (Addendum)
      Blood work was ordered.       Medications changes include :   None    A referral was ordered and someone will call you to schedule an appointment.     Return in about 6 months (around 12/19/2023) for Physical Exam.

## 2023-06-19 ENCOUNTER — Ambulatory Visit (INDEPENDENT_AMBULATORY_CARE_PROVIDER_SITE_OTHER): Payer: BC Managed Care – PPO | Admitting: Internal Medicine

## 2023-06-19 ENCOUNTER — Encounter: Payer: Self-pay | Admitting: Internal Medicine

## 2023-06-19 VITALS — BP 106/70 | HR 88 | Temp 98.2°F | Ht 62.6 in | Wt 153.0 lb

## 2023-06-19 DIAGNOSIS — I10 Essential (primary) hypertension: Secondary | ICD-10-CM

## 2023-06-19 DIAGNOSIS — K219 Gastro-esophageal reflux disease without esophagitis: Secondary | ICD-10-CM | POA: Diagnosis not present

## 2023-06-19 DIAGNOSIS — F419 Anxiety disorder, unspecified: Secondary | ICD-10-CM

## 2023-06-19 DIAGNOSIS — F339 Major depressive disorder, recurrent, unspecified: Secondary | ICD-10-CM | POA: Diagnosis not present

## 2023-06-19 LAB — BASIC METABOLIC PANEL WITH GFR
BUN: 13 mg/dL (ref 6–23)
CO2: 29 meq/L (ref 19–32)
Calcium: 9.2 mg/dL (ref 8.4–10.5)
Chloride: 102 meq/L (ref 96–112)
Creatinine, Ser: 0.85 mg/dL (ref 0.40–1.20)
GFR: 80.54 mL/min (ref >60.00)
Glucose, Bld: 83 mg/dL (ref 70–99)
Potassium: 4 meq/L (ref 3.5–5.1)
Sodium: 137 meq/L (ref 135–145)

## 2023-06-19 MED ORDER — NORETHINDRONE 0.35 MG PO TABS: 1.0000 | ORAL_TABLET | Freq: Every day | ORAL | 3 refills | Status: AC

## 2023-06-19 NOTE — Assessment & Plan Note (Signed)
 Chronic Controlled Continue bupropion XL 150 mg daily, sertraline 150 mg daily

## 2023-06-19 NOTE — Assessment & Plan Note (Signed)
 Chronic Controlled Continue bupropion XL 150 mg daily, sertraline 150 mg daily, alprazolam 0.25 mg as needed

## 2023-06-19 NOTE — Assessment & Plan Note (Signed)
 Chronic Blood pressure well controlled BMP On spironolactone 100 mg daily-this is more for her acne, but likely contributing to well-controlled blood pressure Continue aldactone 100 mg daily

## 2023-06-19 NOTE — Assessment & Plan Note (Addendum)
 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

## 2023-09-27 ENCOUNTER — Other Ambulatory Visit: Payer: Self-pay | Admitting: Internal Medicine

## 2023-09-27 DIAGNOSIS — F419 Anxiety disorder, unspecified: Secondary | ICD-10-CM

## 2023-12-07 ENCOUNTER — Encounter: Payer: Self-pay | Admitting: Internal Medicine

## 2023-12-07 DIAGNOSIS — F419 Anxiety disorder, unspecified: Secondary | ICD-10-CM

## 2023-12-07 MED ORDER — ALPRAZOLAM 0.25 MG PO TABS: ORAL_TABLET | ORAL | 0 refills | Status: DC

## 2023-12-20 ENCOUNTER — Encounter: Payer: Self-pay | Admitting: Internal Medicine

## 2023-12-20 NOTE — Progress Notes (Signed)
 Subjective:    Patient ID: Cathy Guerrero, female    DOB: [birth year], 48 y.o.   MRN: [id number]      HPI Cathy Guerrero is here for a Physical exam and her chronic medical problems.      Medications and allergies reviewed with patient and updated if appropriate.  Current Outpatient Medications on File Prior to Visit  Medication Sig Dispense Refill  . ALPRAZolam  (XANAX ) 0.25 MG tablet TAKE 1 TABLET(0.25 MG) BY MOUTH DAILY AS NEEDED FOR ANXIETY 30 tablet 0  . buPROPion  (WELLBUTRIN  XL) 150 MG 24 hr tablet Take 1 tablet (150 mg total) by mouth daily. 90 tablet 3  . CALCIUM CARBONATE-VITAMIN D  PO Take 1 tablet by mouth daily.    . Cetirizine  HCl 10 MG TBDP Take 1 tablet by mouth at bedtime as needed. 90 tablet 3  . Latanoprost PF 0.005 % SOLN Apply to eye.    . Magnesium 250 MG TABS Take 250 mg by mouth daily.     . Multiple Vitamin (MULTIVITAMIN) capsule Take 1 capsule by mouth daily.    . norethindrone  (MICRONOR ) 0.35 MG tablet Take 1 tablet (0.35 mg total) by mouth daily. 84 tablet 3  . pantoprazole  (PROTONIX ) 40 MG tablet TAKE 1 TABLET DAILY BEFORE SUPPER 30 MINUTES BEFORE A MEAL (STOP PRILOSEC 20 MG) 90 tablet 3  . sertraline  (ZOLOFT ) 100 MG tablet Take 1.5 tablets (150 mg total) by mouth daily. 135 tablet 3  . spironolactone  (ALDACTONE ) 100 MG tablet Take 1 tablet (100 mg total) by mouth daily. 90 tablet 3  . tretinoin  (RETIN-A ) 0.05 % cream Apply 1 application  topically daily as needed (for face). 45 g 11  . valACYclovir  (VALTREX ) 500 MG tablet Take 1 tablet (500 mg total) by mouth 2 (two) times daily. X 3-7 days prn outbreak 60 tablet 11   No current facility-administered medications on file prior to visit.    Review of Systems     Objective:  There were no vitals filed for this visit. There were no vitals filed for this visit. There is no height or weight on file to calculate BMI.  BP Readings from Last 3 Encounters:  12/26/23 132/89  06/19/23 106/70  12/19/22 106/78     Wt Readings from Last 3 Encounters:  06/19/23 153 lb (69.4 kg)  12/19/22 164 lb (74.4 kg)  02/21/22 179 lb (81.2 kg)       Physical Exam Constitutional: She appears well-developed and well-nourished. No distress.  HENT:  Head: Normocephalic and atraumatic.  Right Ear: External ear normal. Normal ear canal and TM Left Ear: External ear normal.  Normal ear canal and TM Mouth/Throat: Oropharynx is clear and moist.  Eyes: Conjunctivae normal.  Neck: Neck supple. No tracheal deviation present. No thyromegaly present.  No carotid bruit  Cardiovascular: Normal rate, regular rhythm and normal heart sounds.   No murmur heard.  No edema. Pulmonary/Chest: Effort normal and breath sounds normal. No respiratory distress. She has no wheezes. She has no rales.  Breast: deferred   Abdominal: Soft. She exhibits no distension. There is no tenderness.  Lymphadenopathy: She has no cervical adenopathy.  Skin: Skin is warm and dry. She is not diaphoretic.  Psychiatric: She has a normal mood and affect. Her behavior is normal.     Lab Results  Component Value Date   WBC 7.7 12/14/2022   HGB 13.1 12/14/2022   HCT 40.0 12/14/2022   PLT 307.0 12/14/2022   GLUCOSE 83 06/19/2023   CHOL  122 12/14/2022   TRIG 88.0 12/14/2022   HDL 47.10 12/14/2022   LDLCALC 57 12/14/2022   ALT 11 12/14/2022   AST 14 12/14/2022   NA 137 06/19/2023   K 4.0 06/19/2023   CL 102 06/19/2023   CREATININE 0.85 06/19/2023   BUN 13 06/19/2023   CO2 29 06/19/2023   TSH 1.67 12/14/2022   INR 1.1 08/25/2013   HGBA1C 5.2 10/02/2018         Assessment & Plan:   Physical exam: Screening blood work  ordered Exercise   Weight   Substance abuse  none   Reviewed recommended immunizations.   Health Maintenance  Topic Date Due  . HIV Screening  Never done  . Hepatitis B Vaccines 19-59 Average Risk (1 of 3 - 19+ 3-dose series) Never done  . Cervical Cancer Screening (HPV/Pap Cotest)  03/10/2019  . Mammogram   12/23/2021  . Influenza Vaccine  10/12/2023  . DTaP/Tdap/Td (2 - Td or Tdap) 07/25/2027  . Colonoscopy  09/30/2027  . Hepatitis C Screening  Completed  . Pneumococcal Vaccine  Aged Out  . HPV VACCINES  Aged Out  . Meningococcal B Vaccine  Aged Out  . COVID-19 Vaccine  Discontinued          See Problem List for Assessment and Plan of chronic medical problems.     This encounter was created in error - please disregard.

## 2023-12-20 NOTE — Patient Instructions (Addendum)

## 2023-12-21 ENCOUNTER — Encounter: Admitting: Internal Medicine

## 2023-12-21 DIAGNOSIS — F419 Anxiety disorder, unspecified: Secondary | ICD-10-CM

## 2023-12-21 DIAGNOSIS — L700 Acne vulgaris: Secondary | ICD-10-CM

## 2023-12-21 DIAGNOSIS — Z0000 Encounter for general adult medical examination without abnormal findings: Secondary | ICD-10-CM

## 2023-12-21 DIAGNOSIS — I10 Essential (primary) hypertension: Secondary | ICD-10-CM

## 2023-12-21 DIAGNOSIS — G4700 Insomnia, unspecified: Secondary | ICD-10-CM

## 2023-12-21 DIAGNOSIS — F339 Major depressive disorder, recurrent, unspecified: Secondary | ICD-10-CM

## 2023-12-21 DIAGNOSIS — K219 Gastro-esophageal reflux disease without esophagitis: Secondary | ICD-10-CM

## 2023-12-21 NOTE — Assessment & Plan Note (Signed)
 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

## 2023-12-21 NOTE — Assessment & Plan Note (Signed)
 Chronic Controlled Continue bupropion XL 150 mg daily, sertraline 150 mg daily, alprazolam 0.25 mg as needed

## 2023-12-21 NOTE — Assessment & Plan Note (Signed)
 Chronic Blood pressure well controlled CMP Continue spironolactone  100 mg daily-this is more for her acne, but likely contributing to well-controlled blood pressure

## 2023-12-21 NOTE — Assessment & Plan Note (Signed)
 Chronic Taking spironolactone  100 mg daily, tretinoin  0.05% cream

## 2023-12-21 NOTE — Assessment & Plan Note (Signed)
 Chronic Improved - controlled As needed xanax -does not take often Continue as needed medication

## 2023-12-21 NOTE — Assessment & Plan Note (Signed)
 Chronic Controlled Continue bupropion XL 150 mg daily, sertraline 150 mg daily

## 2023-12-26 ENCOUNTER — Emergency Department (HOSPITAL_COMMUNITY)
Admission: EM | Admit: 2023-12-26 | Discharge: 2023-12-26 | Disposition: A | Discharge status: Home / Self Care | Source: Ambulatory Visit | Attending: Emergency Medicine | Admitting: Emergency Medicine

## 2023-12-26 ENCOUNTER — Encounter (HOSPITAL_COMMUNITY): Payer: Self-pay | Admitting: Emergency Medicine

## 2023-12-26 ENCOUNTER — Other Ambulatory Visit: Payer: Self-pay

## 2023-12-26 ENCOUNTER — Emergency Department (HOSPITAL_COMMUNITY)

## 2023-12-26 DIAGNOSIS — S0990XA Unspecified injury of head, initial encounter: Secondary | ICD-10-CM

## 2023-12-26 DIAGNOSIS — S0010XA Contusion of unspecified eyelid and periocular area, initial encounter: Secondary | ICD-10-CM | POA: Insufficient documentation

## 2023-12-26 DIAGNOSIS — W108XXA Fall (on) (from) other stairs and steps, initial encounter: Secondary | ICD-10-CM | POA: Insufficient documentation

## 2023-12-26 DIAGNOSIS — S0033XA Contusion of nose, initial encounter: Secondary | ICD-10-CM | POA: Insufficient documentation

## 2023-12-26 NOTE — Discharge Instructions (Addendum)
 You can continue taking over-the-counter ibuprofen and Tylenol , together or alternating, as needed for mild to moderate headache pain.

## 2023-12-26 NOTE — ED Provider Notes (Signed)
 [place] EMERGENCY DEPARTMENT AT [facility] Provider Note   CSN: [id number] Arrival date & time: 12/26/23  1633     Patient presents with: Head Injury   Cathy Guerrero is a 48 y.o. female presenting to ED with a mechanical fall down her basement stairs 4 days ago, about 10 steps in total, says she struck her face, had some bleeding from her nose which is resolved.  She is some soreness in her shoulder but that is resolved as well.  She is concerned because she has had persistent headache now for 4 days.  She is not on blood thinners.  She does not typically have headaches, migraines, or history of concussion.  She denies any neck pain or radiculopathy.  She does state that she was wearing her glasses and they struck the front of her eyes, causing some bruising there   HPI     Prior to Admission medications   Medication Sig Start Date End Date Taking? Authorizing Provider  ALPRAZolam  (XANAX ) 0.25 MG tablet TAKE 1 TABLET(0.25 MG) BY MOUTH DAILY AS NEEDED FOR ANXIETY 12/07/23   Burns, Glade PARAS, MD  buPROPion  (WELLBUTRIN  XL) 150 MG 24 hr tablet Take 1 tablet (150 mg total) by mouth daily. 03/19/23   Geofm Glade PARAS, MD  CALCIUM CARBONATE-VITAMIN D  PO Take 1 tablet by mouth daily.    [provider]  Cetirizine  HCl 10 MG TBDP Take 1 tablet by mouth at bedtime as needed. 02/02/20   McLean-Scocuzza, Randine SAILOR, MD  Latanoprost PF 0.005 % SOLN Apply to eye.    [provider]  Magnesium 250 MG TABS Take 250 mg by mouth daily.     [provider]  Multiple Vitamin (MULTIVITAMIN) capsule Take 1 capsule by mouth daily.    [provider]  norethindrone  (MICRONOR ) 0.35 MG tablet Take 1 tablet (0.35 mg total) by mouth daily. 06/19/23   Geofm Glade PARAS, MD  pantoprazole  (PROTONIX ) 40 MG tablet TAKE 1 TABLET DAILY BEFORE SUPPER 30 MINUTES BEFORE A MEAL (STOP PRILOSEC 20 MG) 03/19/23   Burns, Glade PARAS, MD  sertraline  (ZOLOFT ) 100 MG tablet Take 1.5 tablets (150 mg  total) by mouth daily. 03/19/23   Geofm Glade PARAS, MD  spironolactone  (ALDACTONE ) 100 MG tablet Take 1 tablet (100 mg total) by mouth daily. 03/19/23   Geofm Glade PARAS, MD  tretinoin  (RETIN-A ) 0.05 % cream Apply 1 application  topically daily as needed (for face). 11/03/21   McLean-Scocuzza, Randine SAILOR, MD  valACYclovir  (VALTREX ) 500 MG tablet Take 1 tablet (500 mg total) by mouth 2 (two) times daily. X 3-7 days prn outbreak 10/06/20   McLean-Scocuzza, Randine SAILOR, MD    Allergies: Amoxicillin and Enbrel [etanercept]    Review of Systems  Updated Vital Signs BP 132/89 (BP Location: Left Arm)   Pulse 86   Temp 98.8 F (37.1 C) (Oral)   Resp 17   SpO2 97%   Physical Exam Constitutional:      General: She is not in acute distress. HENT:     Head: Normocephalic.     Comments: Contusion to the bridge of the nose Eyes:     Conjunctiva/sclera: Conjunctivae normal.     Pupils: Pupils are equal, round, and reactive to light.     Comments: Periorbital ecchymosis  Cardiovascular:     Rate and Rhythm: Normal rate and regular rhythm.  Pulmonary:     Effort: Pulmonary effort is normal. No respiratory distress.  Abdominal:  General: There is no distension.     Tenderness: There is no abdominal tenderness.  Musculoskeletal:     Comments: No spinal midline tenderness, full range of motion of the joints with no visible deformity, pain or tenderness  Skin:    General: Skin is warm and dry.  Neurological:     General: No focal deficit present.     Mental Status: She is alert. Mental status is at baseline.  Psychiatric:        Mood and Affect: Mood normal.        Behavior: Behavior normal.     (all labs ordered are listed, but only abnormal results are displayed) Labs Reviewed - No data to display  EKG: None  Radiology: No results found.   Procedures   Medications Ordered in the ED - No data to display                                  Medical Decision Making Amount and/or Complexity  of Data Reviewed Radiology: ordered.    Patient is here with a fall 4 days ago complaining of persistent headache.  CT imaging of the head was ordered and personally reviewed, showing no emergent findings  This is possibly a concussion.  Discussed with patient  Low suspicion for spinal fracture, vascular injury, intra-abdominal or thoracic traumatic injury, including pneumothorax or rib fracture, doubt extremity fracture or pelvic fracture.  She is clinically well-appearing.  GCS is 15.     Final diagnoses:  Injury of head, initial encounter    ED Discharge Orders     None          Avy Barlett, Donnice PARAS, MD 12/29/23 1352

## 2023-12-26 NOTE — ED Triage Notes (Signed)
 Pt reports falling down 14 stairs at her home on Sunday. PT reports continued headache. Denies visual changes, vomiting, or syncope.

## 2024-01-14 LAB — HM MAMMOGRAPHY

## 2024-01-18 LAB — HM PAP SMEAR: HPV, high-risk: NEGATIVE

## 2024-01-30 ENCOUNTER — Encounter: Payer: Self-pay | Admitting: Internal Medicine

## 2024-01-30 NOTE — Progress Notes (Unsigned)
 Subjective:    Patient ID: Cathy Guerrero, female    DOB: Jul 12, 1975, 48 y.o.   MRN: [id number]     HPI Cathy Guerrero is here for follow up of her chronic medical problems.  Doing well.  No concerns.    Medications and allergies reviewed with patient and updated if appropriate.  Current Outpatient Medications on File Prior to Visit  Medication Sig Dispense Refill   ALPRAZolam  (XANAX ) 0.25 MG tablet TAKE 1 TABLET(0.25 MG) BY MOUTH DAILY AS NEEDED FOR ANXIETY 30 tablet 0   buPROPion  (WELLBUTRIN  XL) 150 MG 24 hr tablet Take 1 tablet (150 mg total) by mouth daily. 90 tablet 3   CALCIUM CARBONATE-VITAMIN D  PO Take 1 tablet by mouth daily.     Cetirizine  HCl 10 MG TBDP Take 1 tablet by mouth at bedtime as needed. 90 tablet 3   Latanoprost PF 0.005 % SOLN Apply to eye.     Magnesium 250 MG TABS Take 250 mg by mouth daily.      Multiple Vitamin (MULTIVITAMIN) capsule Take 1 capsule by mouth daily.     norethindrone  (MICRONOR ) 0.35 MG tablet Take 1 tablet (0.35 mg total) by mouth daily. 84 tablet 3   pantoprazole  (PROTONIX ) 40 MG tablet TAKE 1 TABLET DAILY BEFORE SUPPER 30 MINUTES BEFORE A MEAL (STOP PRILOSEC 20 MG) 90 tablet 3   sertraline  (ZOLOFT ) 100 MG tablet Take 1.5 tablets (150 mg total) by mouth daily. 135 tablet 3   spironolactone  (ALDACTONE ) 100 MG tablet Take 1 tablet (100 mg total) by mouth daily. 90 tablet 3   tretinoin  (RETIN-A ) 0.05 % cream Apply 1 application  topically daily as needed (for face). 45 g 11   valACYclovir  (VALTREX ) 500 MG tablet Take 1 tablet (500 mg total) by mouth 2 (two) times daily. X 3-7 days prn outbreak 60 tablet 11   No current facility-administered medications on file prior to visit.     Review of Systems  Constitutional:  Negative for fever.  Respiratory:  Negative for cough, shortness of breath and wheezing.   Cardiovascular:  Negative for chest pain, palpitations and leg swelling.  Gastrointestinal:        No gerd  Neurological:  Negative  for dizziness, light-headedness and headaches.       Objective:   Vitals:   01/31/24 1106  BP: 102/68  Pulse: 75  Temp: 98.1 F (36.7 C)  SpO2: 98%   BP Readings from Last 3 Encounters:  01/31/24 102/68  12/26/23 132/89  06/19/23 106/70   Wt Readings from Last 3 Encounters:  01/31/24 153 lb (69.4 kg)  06/19/23 153 lb (69.4 kg)  12/19/22 164 lb (74.4 kg)   Body mass index is 27.45 kg/m.    Physical Exam Constitutional:      General: She is not in acute distress.    Appearance: Normal appearance.  HENT:     Head: Normocephalic and atraumatic.  Eyes:     Conjunctiva/sclera: Conjunctivae normal.  Cardiovascular:     Rate and Rhythm: Normal rate and regular rhythm.     Heart sounds: Normal heart sounds.  Pulmonary:     Effort: Pulmonary effort is normal. No respiratory distress.     Breath sounds: Normal breath sounds. No wheezing.  Musculoskeletal:     Cervical back: Neck supple.     Right lower leg: No edema.     Left lower leg: No edema.  Lymphadenopathy:     Cervical: No cervical adenopathy.  Skin:  General: Skin is warm and dry.     Findings: No rash.  Neurological:     Mental Status: She is alert. Mental status is at baseline.  Psychiatric:        Mood and Affect: Mood normal.        Behavior: Behavior normal.        Lab Results  Component Value Date   WBC 7.7 12/14/2022   HGB 13.1 12/14/2022   HCT 40.0 12/14/2022   PLT 307.0 12/14/2022   GLUCOSE 83 06/19/2023   CHOL 122 12/14/2022   TRIG 88.0 12/14/2022   HDL 47.10 12/14/2022   LDLCALC 57 12/14/2022   ALT 11 12/14/2022   AST 14 12/14/2022   NA 137 06/19/2023   K 4.0 06/19/2023   CL 102 06/19/2023   CREATININE 0.85 06/19/2023   BUN 13 06/19/2023   CO2 29 06/19/2023   TSH 1.67 12/14/2022   INR 1.1 08/25/2013   HGBA1C 5.2 10/02/2018     Assessment & Plan:    See Problem List for Assessment and Plan of chronic medical problems.

## 2024-01-30 NOTE — Patient Instructions (Addendum)
      Blood work was ordered.       Medications changes include :   None    A referral was ordered and someone will call you to schedule an appointment.     Return in about 6 months (around 07/30/2024) for Physical Exam.

## 2024-01-31 ENCOUNTER — Ambulatory Visit: Payer: Self-pay | Admitting: Internal Medicine

## 2024-01-31 ENCOUNTER — Ambulatory Visit (INDEPENDENT_AMBULATORY_CARE_PROVIDER_SITE_OTHER): Admitting: Internal Medicine

## 2024-01-31 ENCOUNTER — Encounter: Payer: Self-pay | Admitting: Internal Medicine

## 2024-01-31 VITALS — BP 102/68 | HR 75 | Temp 98.1°F | Ht 62.6 in | Wt 153.0 lb

## 2024-01-31 DIAGNOSIS — L700 Acne vulgaris: Secondary | ICD-10-CM | POA: Diagnosis not present

## 2024-01-31 DIAGNOSIS — I10 Essential (primary) hypertension: Secondary | ICD-10-CM

## 2024-01-31 DIAGNOSIS — F339 Major depressive disorder, recurrent, unspecified: Secondary | ICD-10-CM

## 2024-01-31 DIAGNOSIS — F419 Anxiety disorder, unspecified: Secondary | ICD-10-CM | POA: Diagnosis not present

## 2024-01-31 DIAGNOSIS — K219 Gastro-esophageal reflux disease without esophagitis: Secondary | ICD-10-CM

## 2024-01-31 DIAGNOSIS — G4700 Insomnia, unspecified: Secondary | ICD-10-CM

## 2024-01-31 LAB — BASIC METABOLIC PANEL WITH GFR
BUN: 17 mg/dL (ref 6–23)
CO2: 28 meq/L (ref 19–32)
Calcium: 9.3 mg/dL (ref 8.4–10.5)
Chloride: 102 meq/L (ref 96–112)
Creatinine, Ser: 0.98 mg/dL (ref 0.40–1.20)
GFR: 67.6 mL/min (ref >60.00)
Glucose, Bld: 91 mg/dL (ref 70–99)
Potassium: 4.2 meq/L (ref 3.5–5.1)
Sodium: 136 meq/L (ref 135–145)

## 2024-01-31 NOTE — Assessment & Plan Note (Signed)
 Chronic Blood pressure well controlled bmp Continue spironolactone  100 mg daily-this is more for her acne, but likely contributing to well-controlled blood pressure

## 2024-01-31 NOTE — Assessment & Plan Note (Signed)
 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

## 2024-01-31 NOTE — Assessment & Plan Note (Signed)
 Chronic Improved - controlled As needed xanax -does not take often Continue as needed medication

## 2024-01-31 NOTE — Assessment & Plan Note (Signed)
 Chronic Controlled Continue bupropion XL 150 mg daily, sertraline 150 mg daily

## 2024-01-31 NOTE — Assessment & Plan Note (Signed)
 Chronic Controlled Continue bupropion XL 150 mg daily, sertraline 150 mg daily, alprazolam 0.25 mg as needed

## 2024-01-31 NOTE — Assessment & Plan Note (Signed)
 Chronic Controlled  Taking spironolactone  100 mg daily, tretinoin  0.05% cream bmp

## 2024-02-05 ENCOUNTER — Ambulatory Visit: Admitting: Internal Medicine

## 2024-02-25 ENCOUNTER — Other Ambulatory Visit: Payer: Self-pay | Admitting: Internal Medicine

## 2024-02-25 DIAGNOSIS — F339 Major depressive disorder, recurrent, unspecified: Secondary | ICD-10-CM

## 2024-03-19 ENCOUNTER — Other Ambulatory Visit: Payer: Self-pay | Admitting: Family Medicine

## 2024-03-19 DIAGNOSIS — F419 Anxiety disorder, unspecified: Secondary | ICD-10-CM

## 2024-07-30 ENCOUNTER — Encounter: Admitting: Internal Medicine
